# Patient Record
Sex: Female | Born: 1955 | Race: Black or African American | Hispanic: No | Smoking: Former smoker
Health system: Southern US, Community
[De-identification: ages and names within clinical notes are randomized; demographics above are authoritative.]

## PROBLEM LIST (undated history)

## (undated) DIAGNOSIS — E739 Lactose intolerance, unspecified: Secondary | ICD-10-CM

## (undated) DIAGNOSIS — K219 Gastro-esophageal reflux disease without esophagitis: Secondary | ICD-10-CM

## (undated) DIAGNOSIS — Z87442 Personal history of urinary calculi: Secondary | ICD-10-CM

## (undated) DIAGNOSIS — J449 Chronic obstructive pulmonary disease, unspecified: Secondary | ICD-10-CM

## (undated) DIAGNOSIS — R519 Headache, unspecified: Secondary | ICD-10-CM

## (undated) DIAGNOSIS — J45909 Unspecified asthma, uncomplicated: Secondary | ICD-10-CM

## (undated) DIAGNOSIS — M549 Dorsalgia, unspecified: Secondary | ICD-10-CM

## (undated) DIAGNOSIS — M1990 Unspecified osteoarthritis, unspecified site: Secondary | ICD-10-CM

## (undated) DIAGNOSIS — R600 Localized edema: Secondary | ICD-10-CM

## (undated) DIAGNOSIS — J189 Pneumonia, unspecified organism: Secondary | ICD-10-CM

## (undated) DIAGNOSIS — R0600 Dyspnea, unspecified: Secondary | ICD-10-CM

## (undated) DIAGNOSIS — R51 Headache: Secondary | ICD-10-CM

## (undated) DIAGNOSIS — Z91018 Allergy to other foods: Secondary | ICD-10-CM

## (undated) DIAGNOSIS — I10 Essential (primary) hypertension: Secondary | ICD-10-CM

## (undated) HISTORY — DX: Lactose intolerance, unspecified: E73.9

## (undated) HISTORY — DX: Essential (primary) hypertension: I10

## (undated) HISTORY — DX: Dorsalgia, unspecified: M54.9

## (undated) HISTORY — PX: COLONOSCOPY: SHX174

## (undated) HISTORY — PX: TUBAL LIGATION: SHX77

## (undated) HISTORY — DX: Localized edema: R60.0

## (undated) HISTORY — PX: CHOLECYSTECTOMY: SHX55

## (undated) HISTORY — DX: Chronic obstructive pulmonary disease, unspecified: J44.9

## (undated) HISTORY — PX: OTHER SURGICAL HISTORY: SHX169

## (undated) HISTORY — DX: Gastro-esophageal reflux disease without esophagitis: K21.9

## (undated) HISTORY — PX: WISDOM TOOTH EXTRACTION: SHX21

## (undated) HISTORY — DX: Unspecified osteoarthritis, unspecified site: M19.90

## (undated) HISTORY — DX: Dyspnea, unspecified: R06.00

## (undated) HISTORY — PX: KNEE ARTHROSCOPY: SUR90

## (undated) HISTORY — DX: Allergy to other foods: Z91.018

---

## 2002-12-09 HISTORY — PX: CARDIAC CATHETERIZATION: SHX172

## 2003-01-11 ENCOUNTER — Other Ambulatory Visit: Admission: RE | Admit: 2003-01-11 | Discharge: 2003-01-11 | Payer: Self-pay | Admitting: Obstetrics and Gynecology

## 2003-02-22 ENCOUNTER — Ambulatory Visit (HOSPITAL_COMMUNITY): Admission: RE | Admit: 2003-02-22 | Discharge: 2003-02-22 | Payer: Self-pay | Admitting: Cardiology

## 2003-02-22 ENCOUNTER — Encounter: Payer: Self-pay | Admitting: Cardiology

## 2004-11-05 ENCOUNTER — Other Ambulatory Visit: Admission: RE | Admit: 2004-11-05 | Discharge: 2004-11-05 | Payer: Self-pay | Admitting: Obstetrics and Gynecology

## 2006-05-30 ENCOUNTER — Ambulatory Visit: Payer: Self-pay | Admitting: Internal Medicine

## 2006-06-03 ENCOUNTER — Ambulatory Visit: Payer: Self-pay | Admitting: Internal Medicine

## 2006-08-29 ENCOUNTER — Ambulatory Visit: Payer: Self-pay | Admitting: Internal Medicine

## 2006-09-03 ENCOUNTER — Ambulatory Visit: Payer: Self-pay | Admitting: Internal Medicine

## 2006-11-25 ENCOUNTER — Ambulatory Visit: Payer: Self-pay | Admitting: Internal Medicine

## 2007-01-07 ENCOUNTER — Ambulatory Visit: Payer: Self-pay | Admitting: Internal Medicine

## 2007-03-31 ENCOUNTER — Ambulatory Visit (HOSPITAL_COMMUNITY): Admission: RE | Admit: 2007-03-31 | Discharge: 2007-03-31 | Payer: Self-pay | Admitting: Internal Medicine

## 2007-03-31 ENCOUNTER — Ambulatory Visit: Payer: Self-pay | Admitting: Internal Medicine

## 2007-06-05 ENCOUNTER — Ambulatory Visit: Payer: Self-pay | Admitting: Internal Medicine

## 2007-06-05 LAB — CONVERTED CEMR LAB
ALT: 14 units/L (ref 0–35)
Albumin: 3.6 g/dL (ref 3.5–5.2)
BUN: 10 mg/dL (ref 6–23)
Basophils Absolute: 0 10*3/uL (ref 0.0–0.1)
Bilirubin, Direct: 0.1 mg/dL (ref 0.0–0.3)
CO2: 30 meq/L (ref 19–32)
Calcium: 9.1 mg/dL (ref 8.4–10.5)
Chloride: 109 meq/L (ref 96–112)
Eosinophils Absolute: 0.2 10*3/uL (ref 0.0–0.6)
Eosinophils Relative: 3.5 % (ref 0.0–5.0)
GFR calc Af Amer: 113 mL/min
Glucose, Bld: 104 mg/dL — ABNORMAL HIGH (ref 70–99)
LDL Cholesterol: 140 mg/dL — ABNORMAL HIGH (ref 0–99)
Leukocytes, UA: NEGATIVE
Lymphocytes Relative: 28.9 % (ref 12.0–46.0)
MCHC: 33.8 g/dL (ref 30.0–36.0)
MCV: 91.3 fL (ref 78.0–100.0)
Monocytes Absolute: 0.6 10*3/uL (ref 0.2–0.7)
Neutro Abs: 3.4 10*3/uL (ref 1.4–7.7)
Neutrophils Relative %: 55.9 % (ref 43.0–77.0)
Potassium: 3.8 meq/L (ref 3.5–5.1)
RBC: 3.85 M/uL — ABNORMAL LOW (ref 3.87–5.11)
RDW: 12.7 % (ref 11.5–14.6)
Sodium: 142 meq/L (ref 135–145)
TSH: 1.15 microintl units/mL (ref 0.35–5.50)
Total Protein: 6.8 g/dL (ref 6.0–8.3)
Triglycerides: 54 mg/dL (ref 0–149)
Urobilinogen, UA: 1 (ref 0.0–1.0)
VLDL: 11 mg/dL (ref 0–40)
WBC: 5.9 10*3/uL (ref 4.5–10.5)
pH: 6 (ref 5.0–8.0)

## 2007-07-23 ENCOUNTER — Ambulatory Visit: Payer: Self-pay | Admitting: Endocrinology

## 2007-07-31 ENCOUNTER — Ambulatory Visit: Payer: Self-pay | Admitting: *Deleted

## 2007-08-03 ENCOUNTER — Ambulatory Visit: Payer: Self-pay | Admitting: Internal Medicine

## 2007-11-24 ENCOUNTER — Ambulatory Visit: Payer: Self-pay | Admitting: Internal Medicine

## 2007-11-24 ENCOUNTER — Encounter: Payer: Self-pay | Admitting: Internal Medicine

## 2007-11-24 DIAGNOSIS — R142 Eructation: Secondary | ICD-10-CM

## 2007-11-24 DIAGNOSIS — M1990 Unspecified osteoarthritis, unspecified site: Secondary | ICD-10-CM

## 2007-11-24 DIAGNOSIS — R143 Flatulence: Secondary | ICD-10-CM

## 2007-11-24 DIAGNOSIS — K219 Gastro-esophageal reflux disease without esophagitis: Secondary | ICD-10-CM | POA: Insufficient documentation

## 2007-11-24 DIAGNOSIS — R141 Gas pain: Secondary | ICD-10-CM | POA: Insufficient documentation

## 2007-11-24 DIAGNOSIS — J309 Allergic rhinitis, unspecified: Secondary | ICD-10-CM | POA: Insufficient documentation

## 2007-11-24 DIAGNOSIS — I10 Essential (primary) hypertension: Secondary | ICD-10-CM | POA: Insufficient documentation

## 2008-03-24 ENCOUNTER — Ambulatory Visit: Payer: Self-pay | Admitting: Internal Medicine

## 2008-03-24 DIAGNOSIS — R609 Edema, unspecified: Secondary | ICD-10-CM

## 2008-03-24 DIAGNOSIS — R42 Dizziness and giddiness: Secondary | ICD-10-CM

## 2008-03-26 IMAGING — CR DG HIP COMPLETE 2+V*R*
2 series · 2 of 2 positions shown · non-contrast
Comparison: none

CLINICAL DATA: Right hip and pelvic pain.
 RIGHT HIP ? 2 VIEW:

[t hip ap right]
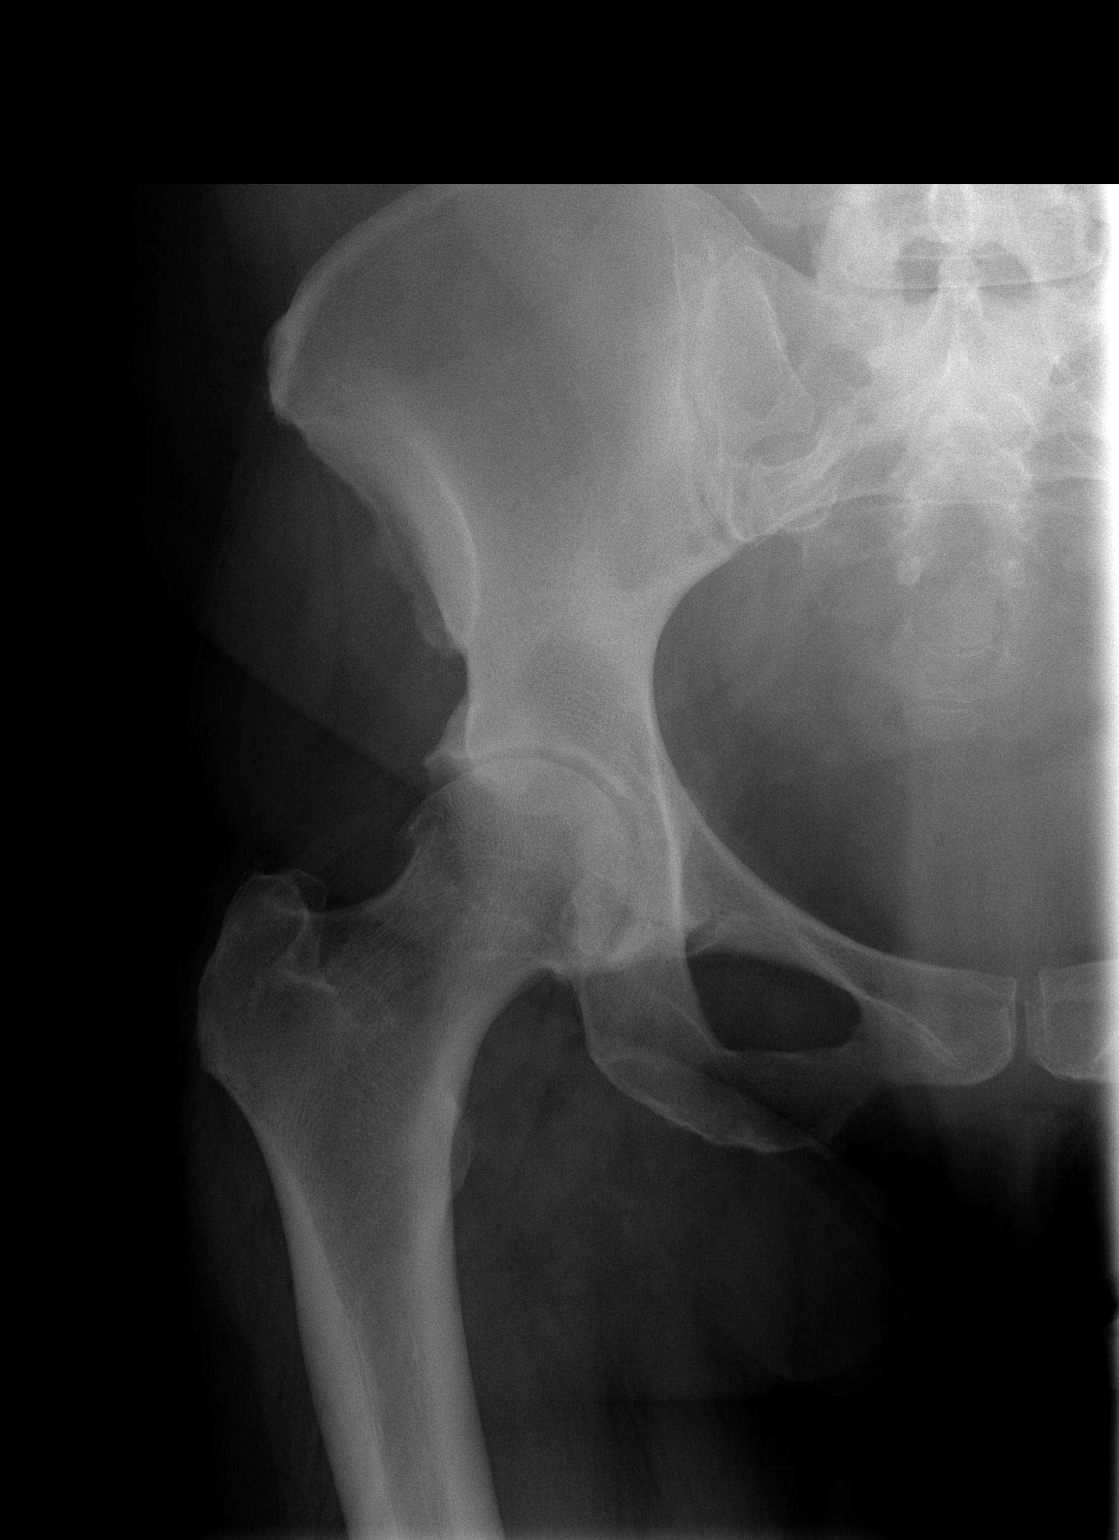

[t hip frog leg right]
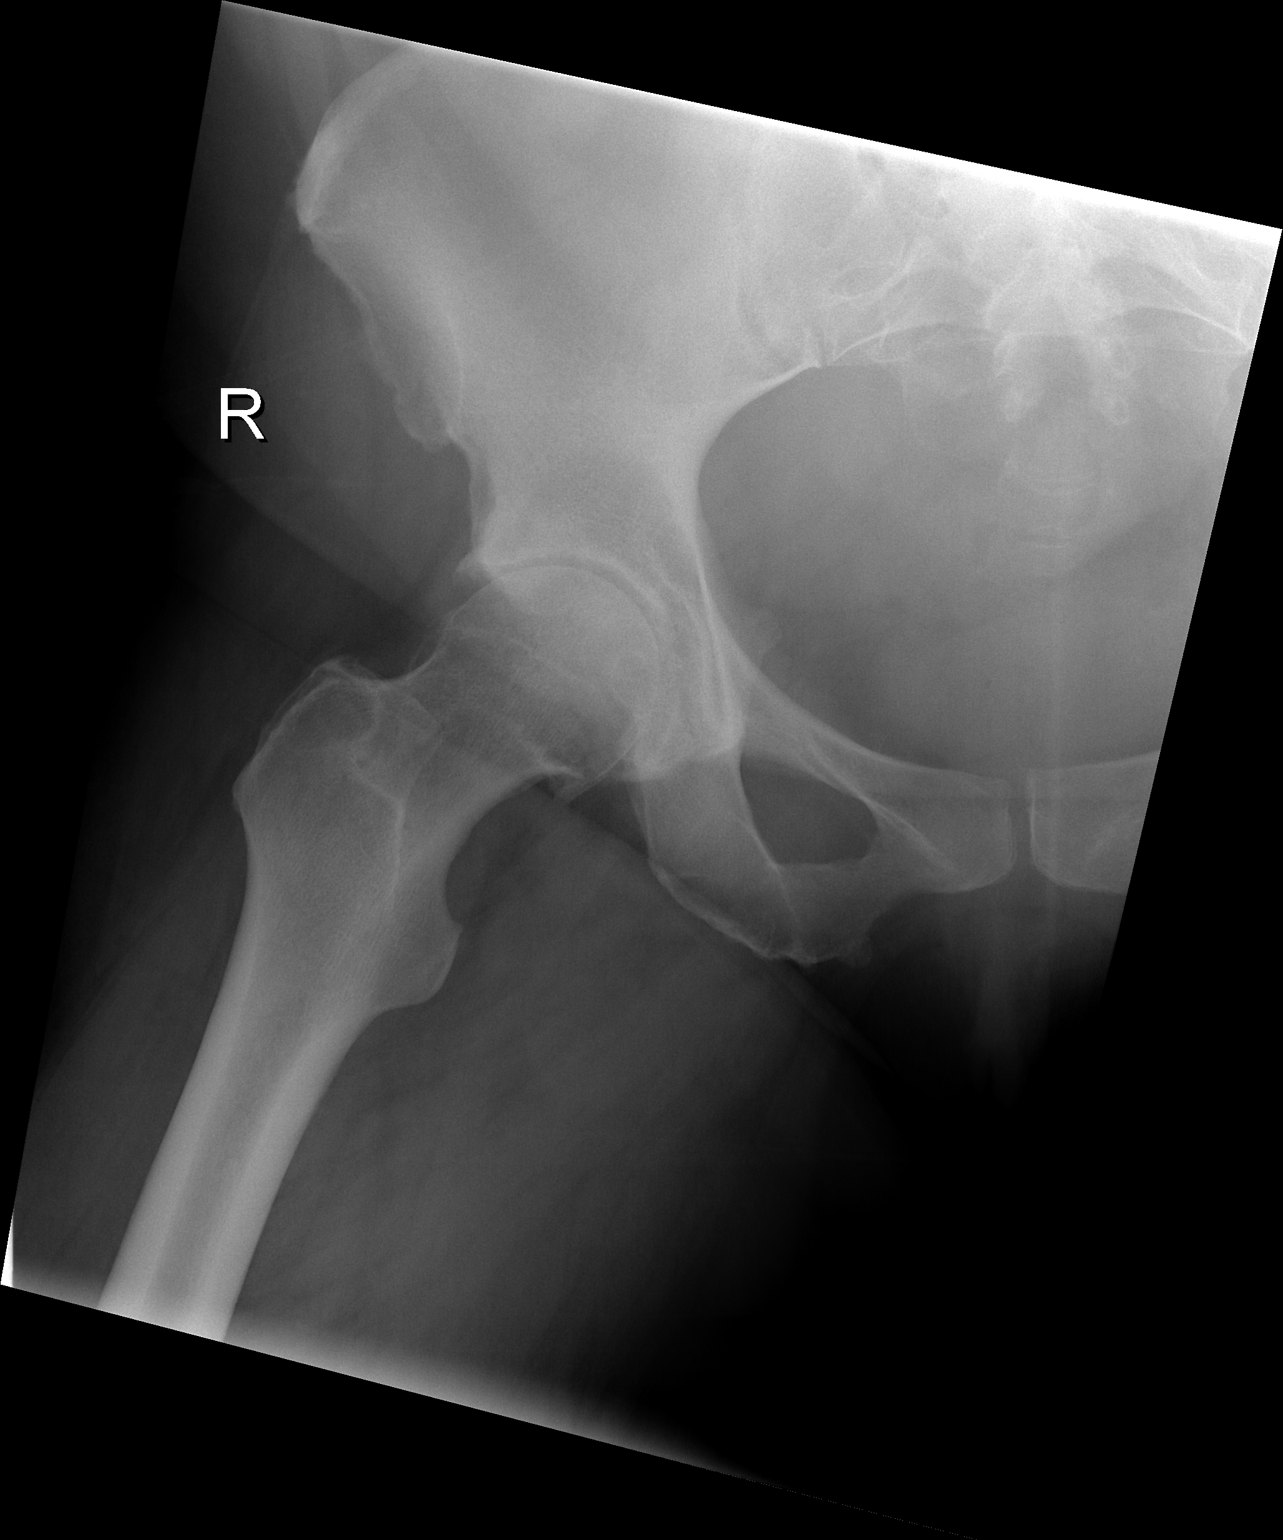

[2 of 2 positions shown; findings below may reference images not displayed]

FINDINGS: There is no evidence of fracture or dislocation.  Severe osteoarthritis of the right hip is seen.  No other bone abnormality is identified.
IMPRESSION: 1.  No acute findings.
 2.  Severe right hip joint osteoarthritis.
 PELVIS ? 1 VIEW:
FINDINGS: There is no evidence of fracture or diastasis.  No other significant bone or soft tissue abnormalities are identified.
IMPRESSION: Negative.

## 2008-06-13 ENCOUNTER — Ambulatory Visit: Payer: Self-pay | Admitting: Internal Medicine

## 2008-06-13 LAB — CONVERTED CEMR LAB
BUN: 11 mg/dL (ref 6–23)
Basophils Absolute: 0 10*3/uL (ref 0.0–0.1)
Basophils Relative: 0.6 % (ref 0.0–1.0)
Bilirubin Urine: NEGATIVE
Bilirubin, Direct: 0.1 mg/dL (ref 0.0–0.3)
GFR calc Af Amer: 96 mL/min
GFR calc non Af Amer: 80 mL/min
Glucose, Bld: 94 mg/dL (ref 70–99)
HDL: 37.1 mg/dL — ABNORMAL LOW (ref >39.0)
Hemoglobin: 12.2 g/dL (ref 12.0–15.0)
Ketones, ur: NEGATIVE mg/dL
Leukocytes, UA: NEGATIVE
Lymphocytes Relative: 27.4 % (ref 12.0–46.0)
MCHC: 34.6 g/dL (ref 30.0–36.0)
Monocytes Relative: 9 % (ref 3.0–12.0)
Neutro Abs: 3.7 10*3/uL (ref 1.4–7.7)
Neutrophils Relative %: 59.4 % (ref 43.0–77.0)
Nitrite: NEGATIVE
Potassium: 4.4 meq/L (ref 3.5–5.1)
RBC: 3.94 M/uL (ref 3.87–5.11)
RDW: 12.6 % (ref 11.5–14.6)
Total CHOL/HDL Ratio: 4.6
Total Protein, Urine: NEGATIVE mg/dL
Triglycerides: 80 mg/dL (ref 0–149)
pH: 5.5 (ref 5.0–8.0)

## 2008-06-17 ENCOUNTER — Ambulatory Visit: Payer: Self-pay | Admitting: Internal Medicine

## 2008-06-17 DIAGNOSIS — R635 Abnormal weight gain: Secondary | ICD-10-CM | POA: Insufficient documentation

## 2008-06-29 ENCOUNTER — Telehealth: Payer: Self-pay | Admitting: Internal Medicine

## 2008-10-10 ENCOUNTER — Ambulatory Visit: Payer: Self-pay | Admitting: Internal Medicine

## 2008-10-11 LAB — CONVERTED CEMR LAB
BUN: 11 mg/dL (ref 6–23)
CO2: 29 meq/L (ref 19–32)
Calcium: 9.5 mg/dL (ref 8.4–10.5)
Chloride: 107 meq/L (ref 96–112)
Creatinine, Ser: 0.9 mg/dL (ref 0.4–1.2)
GFR calc Af Amer: 84 mL/min
GFR calc non Af Amer: 70 mL/min
Glucose, Bld: 118 mg/dL — ABNORMAL HIGH (ref 70–99)

## 2008-10-21 ENCOUNTER — Ambulatory Visit: Payer: Self-pay | Admitting: Internal Medicine

## 2008-10-21 DIAGNOSIS — E669 Obesity, unspecified: Secondary | ICD-10-CM | POA: Insufficient documentation

## 2009-01-27 ENCOUNTER — Ambulatory Visit: Payer: Self-pay | Admitting: Internal Medicine

## 2009-01-27 DIAGNOSIS — R739 Hyperglycemia, unspecified: Secondary | ICD-10-CM

## 2009-01-30 LAB — CONVERTED CEMR LAB
BUN: 12 mg/dL (ref 6–23)
Calcium: 9.4 mg/dL (ref 8.4–10.5)
Creatinine, Ser: 0.8 mg/dL (ref 0.4–1.2)
GFR calc Af Amer: 96 mL/min
GFR calc non Af Amer: 80 mL/min
Glucose, Bld: 99 mg/dL (ref 70–99)
Hgb A1c MFr Bld: 6.1 % — ABNORMAL HIGH (ref 4.6–6.0)

## 2009-07-20 ENCOUNTER — Encounter: Payer: Self-pay | Admitting: Internal Medicine

## 2009-10-09 ENCOUNTER — Ambulatory Visit: Payer: Self-pay | Admitting: Internal Medicine

## 2009-10-09 DIAGNOSIS — M25569 Pain in unspecified knee: Secondary | ICD-10-CM | POA: Insufficient documentation

## 2009-10-19 ENCOUNTER — Ambulatory Visit: Payer: Self-pay | Admitting: Internal Medicine

## 2009-10-31 ENCOUNTER — Ambulatory Visit: Payer: Self-pay | Admitting: Internal Medicine

## 2009-11-07 ENCOUNTER — Encounter: Payer: Self-pay | Admitting: Internal Medicine

## 2010-01-25 ENCOUNTER — Ambulatory Visit: Payer: Self-pay | Admitting: Internal Medicine

## 2010-01-25 DIAGNOSIS — J45909 Unspecified asthma, uncomplicated: Secondary | ICD-10-CM | POA: Insufficient documentation

## 2010-05-22 ENCOUNTER — Telehealth: Payer: Self-pay | Admitting: Internal Medicine

## 2010-07-16 ENCOUNTER — Telehealth: Payer: Self-pay | Admitting: Internal Medicine

## 2010-07-24 ENCOUNTER — Telehealth (INDEPENDENT_AMBULATORY_CARE_PROVIDER_SITE_OTHER): Payer: Self-pay | Admitting: *Deleted

## 2010-07-25 ENCOUNTER — Ambulatory Visit: Payer: Self-pay | Admitting: Internal Medicine

## 2010-07-31 LAB — CONVERTED CEMR LAB
ALT: 17 units/L (ref 0–35)
Alkaline Phosphatase: 70 units/L (ref 39–117)
Basophils Absolute: 0 10*3/uL (ref 0.0–0.1)
Basophils Relative: 0.5 % (ref 0.0–3.0)
Bilirubin, Direct: 0.1 mg/dL (ref 0.0–0.3)
CO2: 30 meq/L (ref 19–32)
Calcium: 9.6 mg/dL (ref 8.4–10.5)
Creatinine, Ser: 0.7 mg/dL (ref 0.4–1.2)
Eosinophils Relative: 3.6 % (ref 0.0–5.0)
HCT: 36.3 % (ref 36.0–46.0)
Lymphs Abs: 1.5 10*3/uL (ref 0.7–4.0)
MCV: 89.7 fL (ref 78.0–100.0)
Monocytes Relative: 7.4 % (ref 3.0–12.0)
RBC: 4.05 M/uL (ref 3.87–5.11)
Sodium: 144 meq/L (ref 135–145)
Total Bilirubin: 0.6 mg/dL (ref 0.3–1.2)
Total Protein, Urine: NEGATIVE mg/dL
Total Protein: 6.8 g/dL (ref 6.0–8.3)
Urine Glucose: NEGATIVE mg/dL
Urobilinogen, UA: 0.2 (ref 0.0–1.0)
WBC: 7.2 10*3/uL (ref 4.5–10.5)

## 2010-10-26 ENCOUNTER — Ambulatory Visit: Payer: Self-pay | Admitting: Internal Medicine

## 2010-10-26 DIAGNOSIS — M25559 Pain in unspecified hip: Secondary | ICD-10-CM

## 2010-10-29 ENCOUNTER — Telehealth: Payer: Self-pay | Admitting: Internal Medicine

## 2010-10-29 LAB — CONVERTED CEMR LAB
Chloride: 103 meq/L (ref 96–112)
GFR calc non Af Amer: 68.03 mL/min (ref >60)
Glucose, Bld: 102 mg/dL — ABNORMAL HIGH (ref 70–99)
HDL: 35.3 mg/dL — ABNORMAL LOW (ref >39.00)
Hgb A1c MFr Bld: 6.3 % (ref 4.6–6.5)
Potassium: 4.5 meq/L (ref 3.5–5.1)
Sodium: 141 meq/L (ref 135–145)
Triglycerides: 104 mg/dL (ref 0.0–149.0)

## 2010-10-30 ENCOUNTER — Encounter: Payer: Self-pay | Admitting: Internal Medicine

## 2010-10-30 ENCOUNTER — Telehealth: Payer: Self-pay | Admitting: Internal Medicine

## 2010-11-26 ENCOUNTER — Telehealth: Payer: Self-pay | Admitting: Internal Medicine

## 2010-12-18 ENCOUNTER — Encounter: Payer: Self-pay | Admitting: Internal Medicine

## 2011-01-08 NOTE — Progress Notes (Signed)
----   Converted from flag ---- ---- 07/18/2010 10:06 AM, Verdell Face wrote: left message on machine to cb to sched appt/cd    ---- 07/17/2010 8:42 AM, Lanier Prude, CMA(AAMA) wrote: Please sched pt for OV per AVP.   Thanks!! ------------------------------ Gave pt  appt;  07/25/10 @ 745A w/Dr Plotnikov--phone

## 2011-01-08 NOTE — Assessment & Plan Note (Signed)
Summary: 3 MO ROV /NWS  #   Vital Signs:  Patient profile:   55 year old female Weight:      250 pounds Temp:     98.8 degrees F oral Pulse rate:   92 / minute Pulse rhythm:   regular Resp:     16 per minute BP sitting:   116 / 72  (left arm) Cuff size:   large  Vitals Entered By: Lanier Prude, CMA(AAMA) (October 26, 2010 8:00 AM) CC: 3 mo f/u Is Patient Diabetic? No Comments pt is not taking Symbicort   Primary Care Provider:  Tresa Garter MD  CC:  3 mo f/u.  History of Present Illness: The patient presents for a follow up of hypertension, elev glu, GERD  Current Medications (verified): 1)  Diprolene Af 0.05 %  Crea (Aug Betamethasone Dipropionate) .... Use Two Times A Day Prn 2)  Furosemide 20 Mg Tabs (Furosemide) .... Take 1 Tab By Mouth Every Day As Needed Swelling 3)  Potassium Chloride Cr 10 Meq  Tbcr (Potassium Chloride) .Marland Kitchen.. 1 By Mouth Once Daily As Needed With Furosemide 4)  Vitamin D3 1000 Unit  Tabs (Cholecalciferol) .Marland Kitchen.. 1 Qd 5)  Coreg 25 Mg  Tabs (Carvedilol) .Marland Kitchen.. 1 By Mouth Bid 6)  Aspir-Low 81 Mg Tbec (Aspirin) .Marland Kitchen.. 1 Once Daily After Meal 7)  Fish Oil   Oil (Fish Oil) .Marland Kitchen.. 1 Two Times A Day Po 8)  Symbicort 160-4.5 Mcg/act Aero (Budesonide-Formoterol Fumarate) .... 2 Puffs Two Times A Day For Asthma 9)  Phentermine Hcl 37.5 Mg Tabs (Phentermine Hcl) .Marland Kitchen.. 1 Every Am  Allergies (verified): 1)  ! Diovan (Valsartan) 2)  Diovan 3)  Hydrochlorothiazide 4)  Verapamil 5)  Phentermine Hcl (Phentermine Hcl)  Past History:  Past Medical History: Last updated: 01/27/2009 Allergic rhinitis Hypertension Osteoarthritis R hip - severe OA GERD ABN GLU 2009  Social History: Last updated: 01/25/2010 Occupation:office Single separated Former Smoker 2009 Alcohol use-no Drug use-no Regular exercise-no  Review of Systems       The patient complains of weight gain and difficulty walking.  The patient denies peripheral edema and abdominal pain.     Physical Exam  General:  overweight-appearing.  well-developed, well-nourished, well-hydrated, appropriate dress, normal appearance, cooperative to examination, and good hygiene.   Mouth:  Oral mucosa and oropharynx without lesions or exudates.  Teeth in good repair. Neck:  No deformities, masses, or tenderness noted. Lungs:  she has good air movement but there are diffuse, bilateral expiratory wheezes and rhonchi but no rales or decreased BS's Heart:  normal rate, regular rhythm, no murmur, no gallop, no rub, and no JVD.   Abdomen:  Bowel sounds positive,abdomen soft and non-tender without masses, organomegaly or hernias noted. Msk:  No deformity or scoliosis noted of thoracic or lumbar spine.  R troch bursa is tender to palpation and w/ROM Neurologic:  No cranial nerve deficits noted. Station and gait are normal. Plantar reflexes are down-going bilaterally. DTRs are symmetrical throughout. Sensory, motor and coordinative functions appear intact. Skin:  Intact without suspicious lesions or rashes Psych:  Cognition and judgment appear intact. Alert and cooperative with normal attention span and concentration. No apparent delusions, illusions, hallucinations   Impression & Recommendations:  Problem # 1:  ABNORMAL GLUCOSE NEC (ICD-790.29) Assessment Comment Only  Orders: TLB-BMP (Basic Metabolic Panel-BMET) (80048-METABOL) TLB-A1C / Hgb A1C (Glycohemoglobin) (83036-A1C) TLB-Lipid Panel (80061-LIPID)  Problem # 2:  OBESITY (ICD-278.00) Assessment: Deteriorated See "Patient Instructions".   Problem # 3:  GERD (ICD-530.81) Assessment: Deteriorated  Problem # 4:  OSTEOARTHRITIS (ICD-715.90) Assessment: Unchanged  Orders: TLB-BMP (Basic Metabolic Panel-BMET) (80048-METABOL) TLB-A1C / Hgb A1C (Glycohemoglobin) (83036-A1C) TLB-Lipid Panel (80061-LIPID)  Her updated medication list for this problem includes:    Aspir-low 81 Mg Tbec (Aspirin) .Marland Kitchen... 1 once daily after meal    Vimovo  500-20 Mg Tbec (Naproxen-esomeprazole) .Marland Kitchen... 1 by mouth once daily - two times a day pc as needed pain  Problem # 5:  HIP PAIN (ICD-719.45) R - troch bursitis Assessment: Deteriorated See "Patient Instructions".  Her updated medication list for this problem includes:    Aspir-low 81 Mg Tbec (Aspirin) .Marland Kitchen... 1 once daily after meal    Vimovo 500-20 Mg Tbec (Naproxen-esomeprazole) .Marland Kitchen... 1 by mouth once daily - two times a day pc as needed pain  Complete Medication List: 1)  Diprolene Af 0.05 % Crea (Aug betamethasone dipropionate) .... Use two times a day prn 2)  Furosemide 20 Mg Tabs (Furosemide) .... Take 1 tab by mouth every day as needed swelling 3)  Potassium Chloride Cr 10 Meq Tbcr (Potassium chloride) .Marland Kitchen.. 1 by mouth once daily as needed with furosemide 4)  Vitamin D3 1000 Unit Tabs (Cholecalciferol) .Marland Kitchen.. 1 qd 5)  Coreg 25 Mg Tabs (Carvedilol) .Marland Kitchen.. 1 by mouth bid 6)  Aspir-low 81 Mg Tbec (Aspirin) .Marland Kitchen.. 1 once daily after meal 7)  Fish Oil Oil (Fish oil) .Marland Kitchen.. 1 two times a day po 8)  Symbicort 160-4.5 Mcg/act Aero (Budesonide-formoterol fumarate) .... 2 puffs two times a day for asthma 9)  Phentermine Hcl 37.5 Mg Tabs (Phentermine hcl) .Marland Kitchen.. 1 every am 10)  Vimovo 500-20 Mg Tbec (Naproxen-esomeprazole) .Marland Kitchen.. 1 by mouth once daily - two times a day pc as needed pain  Patient Instructions: 1)  Please schedule a follow-up appointment in 4 well w/labs  months. 2)  Go on Youtube ([email or web address].com) and look up, "IT band stretch" and "gluteus stretch" and "trochanteric hip bursitis". See the anatomy and learn the symptoms.  .Do the stretches - it may help!  Prescriptions: PHENTERMINE HCL 37.5 MG TABS (PHENTERMINE HCL) 1 every am  #30 x 3   Entered and Authorized by:   Tresa Garter MD   Signed by:   Tresa Garter MD on 10/26/2010   Method used:   Print then Give to Patient   RxID:   [id number] VIMOVO 500-20 MG TBEC (NAPROXEN-ESOMEPRAZOLE) 1 by mouth once daily - two times a  day pc as needed pain  #60 x 3   Entered and Authorized by:   Tresa Garter MD   Signed by:   Tresa Garter MD on 10/26/2010   Method used:   Print then Give to Patient   RxID:   [id number]    Orders Added: 1)  TLB-BMP (Basic Metabolic Panel-BMET) [80048-METABOL] 2)  TLB-A1C / Hgb A1C (Glycohemoglobin) [83036-A1C] 3)  TLB-Lipid Panel [80061-LIPID] 4)  Est. Patient Level IV [25956]   Immunization History:  Influenza Immunization History:    Influenza:  historical (08/23/2010)   Immunization History:  Influenza Immunization History:    Influenza:  Historical (08/23/2010)

## 2011-01-08 NOTE — Assessment & Plan Note (Signed)
Summary: PER FLAG/STACEY--OV--PHONE--STC   Vital Signs:  Patient profile:   55 year old female Weight:      248 pounds Temp:     98.5 degrees F oral Pulse rate:   88 / minute Pulse rhythm:   regular Resp:     16 per minute BP sitting:   130 / 80  (left arm) Cuff size:   large  Vitals Entered By: Lanier Prude, Beverly Gust) (July 25, 2010 7:53 AM) CC: f/u. c/o recent hair loss Is Patient Diabetic? No Comments pt is not taking Tussinex, Symbicort or Tamiflu.  Please remove   Primary Care Atley Neubert:  Tresa Garter MD  CC:  f/u. c/o recent hair loss.  History of Present Illness: The patient presents for a follow up of hypertension, obesity, asthma, OA   Current Medications (verified): 1)  Diprolene Af 0.05 %  Crea (Aug Betamethasone Dipropionate) .... Use Two Times A Day Prn 2)  Furosemide 20 Mg Tabs (Furosemide) .... Take 1 Tab By Mouth Every Day As Needed Swelling 3)  Potassium Chloride Cr 10 Meq  Tbcr (Potassium Chloride) .Marland Kitchen.. 1 By Mouth Once Daily As Needed With Furosemide 4)  Vitamin D3 1000 Unit  Tabs (Cholecalciferol) .Marland Kitchen.. 1 Qd 5)  Coreg 25 Mg  Tabs (Carvedilol) .Marland Kitchen.. 1 By Mouth Bid 6)  Aspir-Low 81 Mg Tbec (Aspirin) .Marland Kitchen.. 1 Once Daily After Meal 7)  Tamiflu 75 Mg Caps (Oseltamivir Phosphate) .... Take One Capsule By Mouth Twice A Day 8)  Fish Oil   Oil (Fish Oil) .Marland Kitchen.. 1 Two Times A Day Po 9)  Symbicort 160-4.5 Mcg/act Aero (Budesonide-Formoterol Fumarate) .... 2 Puffs Two Times A Day For Asthma 10)  Tussionex Pennkinetic Er 8-10 Mg/47ml Lqcr (Chlorpheniramine-Hydrocodone) .... 5 Ml By Mouth Two Times A Day As Needed For Cough 11)  Phentermine Hcl 37.5 Mg Tabs (Phentermine Hcl) .Marland Kitchen.. 1 Every Am  Allergies (verified): 1)  ! Diovan (Valsartan) 2)  Diovan 3)  Hydrochlorothiazide 4)  Verapamil 5)  Phentermine Hcl (Phentermine Hcl)  Past History:  Past Medical History: Last updated: 01/27/2009 Allergic rhinitis Hypertension Osteoarthritis R hip - severe  OA GERD ABN GLU 2009  Past Surgical History: Last updated: 01/25/2010 Denies surgical history  Social History: Last updated: 01/25/2010 Occupation:office Single separated Former Smoker 2009 Alcohol use-no Drug use-no Regular exercise-no  Family History: Family History Hypertension M Parkinson's  Review of Systems       The patient complains of weight gain.  The patient denies fever, weight loss, chest pain, abdominal pain, and difficulty walking.    Physical Exam  General:  overweight-appearing.  well-developed, well-nourished, well-hydrated, appropriate dress, normal appearance, cooperative to examination, and good hygiene.   Eyes:  No corneal or conjunctival inflammation noted. EOMI. Perrla. Funduscopic exam benign, without hemorrhages, exudates or papilledema. Vision grossly normal. Ears:  External ear exam shows no significant lesions or deformities.  Otoscopic examination reveals clear canals, tympanic membranes are intact bilaterally without bulging, retraction, inflammation or discharge. Hearing is grossly normal bilaterally. Nose:  External nasal examination shows no deformity or inflammation. Nasal mucosa are pink and moist without lesions or exudates. Mouth:  Oral mucosa and oropharynx without lesions or exudates.  Teeth in good repair. Neck:  No deformities, masses, or tenderness noted. Lungs:  she has good air movement but there are diffuse, bilateral expiratory wheezes and rhonchi but no rales or decreased BS's Heart:  normal rate, regular rhythm, no murmur, no gallop, no rub, and no JVD.   Abdomen:  Bowel sounds  positive,abdomen soft and non-tender without masses, organomegaly or hernias noted. Msk:  No deformity or scoliosis noted of thoracic or lumbar spine.   Extremities:  No clubbing, cyanosis, edema, or deformity noted with normal full range of motion of all joints.   Neurologic:  No cranial nerve deficits noted. Station and gait are normal. Plantar reflexes  are down-going bilaterally. DTRs are symmetrical throughout. Sensory, motor and coordinative functions appear intact. Skin:  Intact without suspicious lesions or rashes Psych:  Cognition and judgment appear intact. Alert and cooperative with normal attention span and concentration. No apparent delusions, illusions, hallucinations   Impression & Recommendations:  Problem # 1:  HYPERTENSION (ICD-401.9) Assessment Unchanged  Her updated medication list for this problem includes:    Furosemide 20 Mg Tabs (Furosemide) .Marland Kitchen... Take 1 tab by mouth every day as needed swelling    Coreg 25 Mg Tabs (Carvedilol) .Marland Kitchen... 1 by mouth bid  Orders: TLB-BMP (Basic Metabolic Panel-BMET) (80048-METABOL) TLB-CBC Platelet - w/Differential (85025-CBCD) TLB-Hepatic/Liver Function Pnl (80076-HEPATIC) TLB-TSH (Thyroid Stimulating Hormone) (84443-TSH) TLB-Udip ONLY (81003-UDIP) TLB-A1C / Hgb A1C (Glycohemoglobin) (83036-A1C)  Problem # 2:  OBESITY (ICD-278.00) Assessment: Deteriorated Phentermine to cont Risks vs benefits and controversies of a long term phentermine use were discussed.  Orders: TLB-BMP (Basic Metabolic Panel-BMET) (80048-METABOL) TLB-CBC Platelet - w/Differential (85025-CBCD) TLB-Hepatic/Liver Function Pnl (80076-HEPATIC) TLB-TSH (Thyroid Stimulating Hormone) (84443-TSH) TLB-Udip ONLY (81003-UDIP) TLB-A1C / Hgb A1C (Glycohemoglobin) (83036-A1C)  Problem # 3:  ABNORMAL GLUCOSE NEC (ICD-790.29) Assessment: Comment Only  Orders: TLB-BMP (Basic Metabolic Panel-BMET) (80048-METABOL) TLB-CBC Platelet - w/Differential (85025-CBCD) TLB-Hepatic/Liver Function Pnl (80076-HEPATIC) TLB-TSH (Thyroid Stimulating Hormone) (84443-TSH) TLB-Udip ONLY (81003-UDIP) TLB-A1C / Hgb A1C (Glycohemoglobin) (83036-A1C)  Problem # 4:  GERD (ICD-530.81) Assessment: Comment Only  Orders: TLB-BMP (Basic Metabolic Panel-BMET) (80048-METABOL) TLB-CBC Platelet - w/Differential (85025-CBCD) TLB-Hepatic/Liver  Function Pnl (80076-HEPATIC) TLB-TSH (Thyroid Stimulating Hormone) (84443-TSH) TLB-Udip ONLY (81003-UDIP) TLB-A1C / Hgb A1C (Glycohemoglobin) (83036-A1C)  Complete Medication List: 1)  Diprolene Af 0.05 % Crea (Aug betamethasone dipropionate) .... Use two times a day prn 2)  Furosemide 20 Mg Tabs (Furosemide) .... Take 1 tab by mouth every day as needed swelling 3)  Potassium Chloride Cr 10 Meq Tbcr (Potassium chloride) .Marland Kitchen.. 1 by mouth once daily as needed with furosemide 4)  Vitamin D3 1000 Unit Tabs (Cholecalciferol) .Marland Kitchen.. 1 qd 5)  Coreg 25 Mg Tabs (Carvedilol) .Marland Kitchen.. 1 by mouth bid 6)  Aspir-low 81 Mg Tbec (Aspirin) .Marland Kitchen.. 1 once daily after meal 7)  Tamiflu 75 Mg Caps (Oseltamivir phosphate) .... Take one capsule by mouth twice a day 8)  Fish Oil Oil (Fish oil) .Marland Kitchen.. 1 two times a day po 9)  Symbicort 160-4.5 Mcg/act Aero (Budesonide-formoterol fumarate) .... 2 puffs two times a day for asthma 10)  Phentermine Hcl 37.5 Mg Tabs (Phentermine hcl) .Marland Kitchen.. 1 every am  Patient Instructions: 1)  Please schedule a follow-up appointment in 3 months. Prescriptions: PHENTERMINE HCL 37.5 MG TABS (PHENTERMINE HCL) 1 every am  #30 x 3   Entered and Authorized by:   Tresa Garter MD   Signed by:   Tresa Garter MD on 07/25/2010   Method used:   Print then Give to Patient   RxID:   [id number]

## 2011-01-08 NOTE — Progress Notes (Signed)
Summary: PA Vimovo  Phone Note From Pharmacy   Summary of Call: PA Vimovo called [employer] [phone] ref #[id number], form sent to Dr Posey Rea to be completed  Dagoberto Reef  October 30, 2010 3:25 PM  Insurance denied does not meet medical necessity found in the members benefit booklet. Initial call taken by: Dagoberto Reef,  November 06, 2010 9:03 AM  Follow-up for Phone Call        noted Thank you!  Follow-up by: Tresa Garter MD,  November 07, 2010 1:04 PM

## 2011-01-08 NOTE — Progress Notes (Signed)
Summary: Refill--Phentermine  Phone Note From Pharmacy   Caller: [facility]  [address]  [phone]* Summary of Call: Requests refill of Phentermine. Please advise. Initial call taken by: Lucious Groves,  May 22, 2010 9:17 AM  Follow-up for Phone Call        ok x 1 Follow-up by: Tresa Garter MD,  May 22, 2010 5:13 PM    New/Updated Medications: PHENTERMINE HCL 37.5 MG TABS (PHENTERMINE HCL) 1 every am Prescriptions: PHENTERMINE HCL 37.5 MG TABS (PHENTERMINE HCL) 1 every am  #30 x 0   Entered by:   Lamar Sprinkles, CMA   Authorized by:   Tresa Garter MD   Signed by:   Lamar Sprinkles, CMA on 05/22/2010   Method used:   Telephoned to ...       [facility]  [phone]* (retail)       [address] [place], [place]  [address]       Ph: [phone] or [phone]       Fax: [phone]   RxID:   [id number]

## 2011-01-08 NOTE — Progress Notes (Signed)
Summary: Rf Phentermine  Phone Note Refill Request Message from:  Fax from Pharmacy  Refills Requested: Medication #1:  PHENTERMINE HCL 37.5 MG TABS 1 every am.   Dosage confirmed as above?Dosage Confirmed   Supply Requested: 30   Last Refilled: 05/22/2010  Method Requested: Telephone to Pharmacy Initial call taken by: Lanier Prude, [facility]),  July 16, 2010 9:44 AM  Follow-up for Phone Call        ok x 1 and OV Follow-up by: Tresa Garter MD,  July 17, 2010 8:02 AM  Additional Follow-up for Phone Call Additional follow up Details #1::        Rx called to pharmacy Additional Follow-up by: Lanier Prude, [facility]),  July 17, 2010 8:41 AM    Prescriptions: PHENTERMINE HCL 37.5 MG TABS (PHENTERMINE HCL) 1 every am  #30 x 0   Entered by:   Lanier Prude, CMA(AAMA)   Authorized by:   Tresa Garter MD   Signed by:   Lanier Prude, CMA(AAMA) on 07/17/2010   Method used:   Telephoned to ...       [facility]  [phone]* (retail)       [address] [place], [place]  [address]       Ph: [phone] or [phone]       Fax: [phone]   RxID:   [id number]

## 2011-01-08 NOTE — Assessment & Plan Note (Signed)
Summary: flu symptoms/no fever/plot/#/cd   Vital Signs:  Patient profile:   55 year old female Weight:      247 pounds O2 Sat:      96 % on Room air Temp:     98.2 degrees F oral Pulse rate:   84 / minute Pulse rhythm:   regular Resp:     20 per minute BP sitting:   130 / 66  (left arm) Cuff size:   large  Vitals Entered By: Rock Nephew CMA (January 25, 2010 10:47 AM)  O2 Flow:  Room air CC: follow up after flu like symptoms- bodyache, URI , URI symptoms   Primary Care Provider:  Tresa Garter MD  CC:  follow up after flu like symptoms- bodyache, URI , and URI symptoms.  History of Present Illness:  URI Symptoms      This is a 55 year old woman who presents with URI symptoms.  The symptoms began 1 week ago.  The severity is described as moderate.  The patient reports nasal congestion, clear nasal discharge, sore throat, productive cough, and sick contacts, but denies purulent nasal discharge and earache.  Associated symptoms include low-grade fever (<100.5 degrees), dyspnea, and wheezing.  The patient denies stiff neck, rash, vomiting, diarrhea, and use of an antipyretic.  The patient also reports muscle aches and severe fatigue.  The patient denies seasonal symptoms and headache.  The patient denies the following risk factors for Strep sinusitis: unilateral facial pain, unilateral nasal discharge, double sickening, Strep exposure, tender adenopathy, and absence of cough.    Preventive Screening-Counseling & Management  Alcohol-Tobacco     Alcohol drinks/day: 0     Smoking Status: quit > 6 months     Year Quit: 2009  Caffeine-Diet-Exercise     Does Patient Exercise: no      Drug Use:  no.    Allergies: 1)  ! Diovan (Valsartan) 2)  Diovan 3)  Hydrochlorothiazide 4)  Verapamil 5)  Phentermine Hcl (Phentermine Hcl)  Past History:  Past Medical History: Reviewed history from 01/27/2009 and no changes required. Allergic rhinitis Hypertension Osteoarthritis  R hip - severe OA GERD ABN GLU 2009  Past Surgical History: Denies surgical history  Family History: Reviewed history from 11/24/2007 and no changes required. Family History Hypertension  Social History: Reviewed history from 03/24/2008 and no changes required. Occupation:office Single separated Former Smoker 2009 Alcohol use-no Drug use-no Regular exercise-no Smoking Status:  quit > 6 months Drug Use:  no Does Patient Exercise:  no  Review of Systems       The patient complains of weight gain.  The patient denies anorexia, chest pain, peripheral edema, hemoptysis, abdominal pain, hematuria, suspicious skin lesions, and enlarged lymph nodes.    Physical Exam  General:  overweight-appearing.  well-developed, well-nourished, well-hydrated, appropriate dress, normal appearance, cooperative to examination, and good hygiene.   Head:  normocephalic, atraumatic, no abnormalities observed, and no abnormalities palpated.   Ears:  External ear exam shows no significant lesions or deformities.  Otoscopic examination reveals clear canals, tympanic membranes are intact bilaterally without bulging, retraction, inflammation or discharge. Hearing is grossly normal bilaterally. Nose:  External nasal examination shows no deformity or inflammation. Nasal mucosa are pink and moist without lesions or exudates. Mouth:  Oral mucosa and oropharynx without lesions or exudates.  Teeth in good repair. Neck:  No deformities, masses, or tenderness noted. Lungs:  she has good air movement but there are diffuse, bilateral expiratory wheezes and rhonchi but  no rales or decreased BS's Heart:  normal rate, regular rhythm, no murmur, no gallop, no rub, and no JVD.   Abdomen:  Bowel sounds positive,abdomen soft and non-tender without masses, organomegaly or hernias noted. Msk:  No deformity or scoliosis noted of thoracic or lumbar spine.   Pulses:  R and L carotid,radial,femoral,dorsalis pedis and posterior  tibial pulses are full and equal bilaterally Extremities:  No clubbing, cyanosis, edema, or deformity noted with normal full range of motion of all joints.   Neurologic:  No cranial nerve deficits noted. Station and gait are normal. Plantar reflexes are down-going bilaterally. DTRs are symmetrical throughout. Sensory, motor and coordinative functions appear intact. Skin:  Intact without suspicious lesions or rashes Cervical Nodes:  no anterior cervical adenopathy and no posterior cervical adenopathy.   Axillary Nodes:  no R axillary adenopathy and no L axillary adenopathy.   Psych:  Cognition and judgment appear intact. Alert and cooperative with normal attention span and concentration. No apparent delusions, illusions, hallucinations   Impression & Recommendations:  Problem # 1:  COUGH (ICD-786.2)  Orders: T-2 View CXR (71020TC) Admin of Therapeutic Inj  intramuscular or subcutaneous (81191) Depo- Medrol 40mg  (J1030) Depo- Medrol 80mg  (J1040)  Problem # 2:  BRONCHITIS-ACUTE (ICD-466.0)  Her updated medication list for this problem includes:    Symbicort 160-4.5 Mcg/act Aero (Budesonide-formoterol fumarate) .Marland Kitchen... 2 puffs two times a day for asthma    Zithromax Tri-pak 500 Mg Tab (Azithromycin) .Marland Kitchen... Take one by mouth once daily for 3 days    Tussionex Pennkinetic Er 8-10 Mg/73ml Lqcr (Chlorpheniramine-hydrocodone) .Marland KitchenMarland KitchenMarland KitchenMarland Kitchen 5 ml by mouth two times a day as needed for cough  Problem # 3:  CHRONIC OBSTRUCTIVE ASTHMA WITH EXACERBATION (YNW-295.62)  Her updated medication list for this problem includes:    Symbicort 160-4.5 Mcg/act Aero (Budesonide-formoterol fumarate) .Marland Kitchen... 2 puffs two times a day for asthma  Complete Medication List: 1)  Diprolene Af 0.05 % Crea (Aug betamethasone dipropionate) .... Use two times a day prn 2)  Furosemide 20 Mg Tabs (Furosemide) .... Take 1 tab by mouth every day as needed swelling 3)  Potassium Chloride Cr 10 Meq Tbcr (Potassium chloride) .Marland Kitchen.. 1 by mouth  once daily as needed with furosemide 4)  Vitamin D3 1000 Unit Tabs (Cholecalciferol) .Marland Kitchen.. 1 qd 5)  Coreg 25 Mg Tabs (Carvedilol) .Marland Kitchen.. 1 by mouth bid 6)  Aspir-low 81 Mg Tbec (Aspirin) .Marland Kitchen.. 1 once daily after meal 7)  Tamiflu 75 Mg Caps (Oseltamivir phosphate) .... Take one capsule by mouth twice a day 8)  Fish Oil Oil (Fish oil) .Marland Kitchen.. 1 two times a day po 9)  Symbicort 160-4.5 Mcg/act Aero (Budesonide-formoterol fumarate) .... 2 puffs two times a day for asthma 10)  Zithromax Tri-pak 500 Mg Tab (Azithromycin) .... Take one by mouth once daily for 3 days 11)  Tussionex Pennkinetic Er 8-10 Mg/79ml Lqcr (Chlorpheniramine-hydrocodone) .... 5 ml by mouth two times a day as needed for cough  Patient Instructions: 1)  Please schedule a follow-up appointment in 2 weeks. 2)  It is important that you exercise regularly at least 20 minutes 5 times a week. If you develop chest pain, have severe difficulty breathing, or feel very tired , stop exercising immediately and seek medical attention. 3)  You need to lose weight. Consider a lower calorie diet and regular exercise.  4)  Take your antibiotic as prescribed until ALL of it is gone, but stop if you develop a rash or swelling and contact our office as soon  as possible. 5)  Acute bronchitis symptoms for less than 10 days are not helped by antibiotics. take over the counter cough medications. call if no improvment in  5-7 days, sooner if increasing cough, fever, or new symptoms( shortness of breath, chest pain). Prescriptions: TUSSIONEX PENNKINETIC ER 8-10 MG/5ML LQCR (CHLORPHENIRAMINE-HYDROCODONE) 5 ml by mouth two times a day as needed for cough  #4 ounces x 0   Entered and Authorized by:   Etta Grandchild MD   Signed by:   Etta Grandchild MD on 01/25/2010   Method used:   Print then Give to Patient   RxID:   [id number] QMVHQIONG TRI-PAK 500 MG TAB (AZITHROMYCIN) Take one by mouth once daily for 3 days  #3 x 0   Entered and Authorized by:   Etta Grandchild MD   Signed by:   Etta Grandchild MD on 01/25/2010   Method used:   Print then Give to Patient   RxID:   [id number] SYMBICORT 160-4.5 MCG/ACT AERO (BUDESONIDE-FORMOTEROL FUMARATE) 2 puffs two times a day for asthma  #5 inhs x 0   Entered and Authorized by:   Etta Grandchild MD   Signed by:   Etta Grandchild MD on 01/25/2010   Method used:   Samples Given   RxID:   [id number]      Medication Administration  Injection # 1:    Medication: Depo- Medrol 80mg     Diagnosis: COUGH (ICD-786.2)    Route: IM    Site: R deltoid    Exp Date: 08/2012    Lot #: Franchot Heidelberg    Mfr: pfizer    Patient tolerated injection without complications    Given by: Rock Nephew CMA (January 25, 2010 11:17 AM)  Injection # 2:    Medication: Depo- Medrol 40mg     Diagnosis: COUGH (ICD-786.2)    Route: IM    Site: R deltoid    Exp Date: 08/2012    Lot #: Franchot Heidelberg    Mfr: pfizer    Patient tolerated injection without complications    Given by: Rock Nephew CMA (January 25, 2010 11:17 AM)  Orders Added: 1)  T-2 View CXR [71020TC] 2)  Admin of Therapeutic Inj  intramuscular or subcutaneous [96372] 3)  Depo- Medrol 40mg  [J1030] 4)  Depo- Medrol 80mg  [J1040] 5)  Est. Patient Level IV [63875]

## 2011-01-08 NOTE — Progress Notes (Signed)
Summary: Cholesterol numbers  Phone Note Call from Patient   Caller: Patient Summary of Call: I informed pt of cholesterol numbers. She wants to know what else she can do to improve LDL/HDL since they are both slightly out of range. Please advise Initial call taken by: Lanier Prude, [facility]),  October 29, 2010 12:03 PM  Follow-up for Phone Call        Loose wt, eat healthy, exercise Follow-up by: Tresa Garter MD,  October 29, 2010 5:32 PM  Additional Follow-up for Phone Call Additional follow up Details #1::        # busy x 2 attempts...............Marland KitchenLamar Sprinkles, CMA  October 29, 2010 6:26 PM     Additional Follow-up for Phone Call Additional follow up Details #2::    pt informed  Follow-up by: Lanier Prude, [facility]),  October 30, 2010 9:55 AM

## 2011-01-10 NOTE — Progress Notes (Signed)
Summary: Vimovo ?  Phone Note From Pharmacy   Caller: [facility] She[address] Maroon  [phone]* Summary of Call: pt's Vimovo was denied. Do you want to change it to something different? Initial call taken by: Lanier Prude, [facility]),  November 26, 2010 8:41 AM  Follow-up for Phone Call        OK omepr and naprox Follow-up by: Tresa Garter MD,  November 26, 2010 5:41 PM  Additional Follow-up for Phone Call Additional follow up Details #1::        left mess to call office back................Marland KitchenLamar Sprinkles, CMA  November 26, 2010 6:12 PM   left mess to call office back.................Marland KitchenLamar Sprinkles, CMA  November 27, 2010 3:54 PM   pt left vm at 5pm w/cell # [phone], attempted to call, no answer, left vm...............Marland KitchenLamar Sprinkles, CMA  November 27, 2010 6:02 PM     Additional Follow-up for Phone Call Additional follow up Details #2::    pt informed Follow-up by: Brenton Grills CMA [facility]),  November 28, 2010 8:57 AM  New/Updated Medications: NAPROXEN 500 MG TABS (NAPROXEN) 1 by mouth two times a day pc for pain/arthritis OMEPRAZOLE 40 MG CPDR (OMEPRAZOLE) 1 by mouth qam with Naproxen Prescriptions: OMEPRAZOLE 40 MG CPDR (OMEPRAZOLE) 1 by mouth qam with Naproxen  #30 x 3   Entered and Authorized by:   Tresa Garter MD   Signed by:   Lamar Sprinkles, CMA on 11/26/2010   Method used:   Electronically to        [facility]  [phone]* (retail)       [address] [place], [place]  [address]       Ph: [phone] or [phone]       Fax: [phone]   RxID:   [id number] NAPROXEN 500 MG TABS (NAPROXEN) 1 by mouth two times a day pc for pain/arthritis  #60 x 3   Entered and Authorized by:   Tresa Garter MD   Signed by:   Lamar Sprinkles, CMA on 11/26/2010   Method used:   Electronically to        [facility]  [phone]* (retail)       [address] [place], [place]  [address]       Ph: [phone] or [phone]       Fax: [phone]  RxID:   [id number]

## 2011-01-10 NOTE — Medication Information (Signed)
Summary: Prior autho & Denied for Vimovo/[employer]  Prior autho & Denied for Vimovo/[employer]   Imported By: Sherian Rein 11/23/2010 12:56:10  _____________________________________________________________________  External Attachment:    Type:   Image     Comment:   External Document

## 2011-01-10 NOTE — Letter (Signed)
Summary: Appt Reminder 2  [facility]  [address] [place], [place] [address]   Phone: [phone]  Fax: [phone]        December 18, 2010 MRN: [id number]    [name] [address] [place], [place]  [address]    Dear [name],   You have a return appointment with Dr. Leone Payor on 01/14/11 at 8:30am.  Please remember to bring a complete list of the medicines you are taking, your insurance card and your co-pay.  If you have to cancel or reschedule this appointment, please call before 5:00 pm the evening before to avoid a cancellation fee.  If you have any questions or concerns, please call [phone].    Sincerely,    Selinda Michaels RN  Appended Document: Appt Reminder 2 Letter is mailed to the patient's home address

## 2011-01-14 ENCOUNTER — Ambulatory Visit: Payer: Self-pay | Admitting: Internal Medicine

## 2011-02-22 ENCOUNTER — Other Ambulatory Visit: Payer: Self-pay

## 2011-03-01 ENCOUNTER — Ambulatory Visit: Payer: BC Managed Care – PPO | Admitting: Internal Medicine

## 2011-03-01 DIAGNOSIS — Z0289 Encounter for other administrative examinations: Secondary | ICD-10-CM

## 2011-04-23 NOTE — Assessment & Plan Note (Signed)
[facility]                           PRIMARY CARE OFFICE NOTE   NAME:Shelby, [name]                       MRN:          [id number]  DATE:06/05/2007                            DOB:          February 22, 1956    The patient is a 55 year old female who presents for a wellness  examination.   PAST MEDICAL HISTORY:  As per June 03, 2006 note.   FAMILY HISTORY:  As per June 03, 2006 note.   SOCIAL HISTORY:  As per June 03, 2006 note.  She has been separated for  several months now.  Does not smoke.   REVIEW OF SYSTEMS:  No chest pain or shortness of breath.  Severe pain  and stiffness in the right hip over the past several months.  Flare up  was noted lately.  The rest of the 18-point review of system is  negative.   PHYSICAL EXAMINATION:  Blood pressure 151/77.  Pulse 94.  Temperature  98.7.  Weight 229 (was 226).  She is in no acute distress.  Looks well.  HEENT:  Shows moist mucosa.  NECK:  Supple.  No thyromegaly or bruit.  LUNGS:  Clear to auscultation and percussion.  No wheezes or rales.  HEART:  S1 and S2.  No murmur.  No gallop.  ABDOMEN:  Soft and non-tender.  No organomegaly or mass felt.  LOWER EXTREMITIES:  Without edema.  The right hip with decreased range  of motion.  No visible deformity.  No limp was present.  She is alert, oriented, and appropriate.  Eczema changes on the fingertips involving several fingers.   Labs on June 05, 2007:  Hemoglobin 11.9.  CMET normal.  Glucose 104.  TSH normal.  Urinalysis normal.  EKG today normal.  LDL 140, cholesterol  184.   ASSESSMENT AND PLAN:  1. Normal wellness examination.  Age/health issues discussed.  Healthy      lifestyle discussed.  She is due for a Gynecology appointment that      she is going to schedule.  Mammogram yearly.  She is past due      colonoscopy, advised to schedule.  Given tetanus shot.  Other shots      are up to date.  2. Right hip severe osteoarthritis confirmed by the  x-ray.  She can      use Darvocet N100 q.i.d. p.r.n. Risks and benefits discussed.      Naproxen 500 p.o. b.i.d. after meals as needed.  3. Eczema.  Diprolene cream to use b.i.d. p.r.n.  4. Hypertension.  Increase verapamil dose to 240 mg daily.  Call me if      the blood pressure is elevated.  I will see her back in 6 months.     Georgina Quint. Plotnikov, MD  Electronically Signed    AVP/MedQ  DD: 06/10/2007  DT: 06/10/2007  Job #: [id number]

## 2011-05-04 ENCOUNTER — Other Ambulatory Visit: Payer: Self-pay | Admitting: Internal Medicine

## 2011-07-06 ENCOUNTER — Other Ambulatory Visit: Payer: Self-pay | Admitting: Internal Medicine

## 2011-10-15 ENCOUNTER — Other Ambulatory Visit: Payer: Self-pay | Admitting: Internal Medicine

## 2011-12-18 ENCOUNTER — Telehealth: Payer: Self-pay | Admitting: *Deleted

## 2011-12-18 DIAGNOSIS — Z0000 Encounter for general adult medical examination without abnormal findings: Secondary | ICD-10-CM

## 2011-12-18 NOTE — Telephone Encounter (Signed)
Labs for wellness pls Thx

## 2011-12-18 NOTE — Telephone Encounter (Signed)
Pt left vm requesting labs to be checked (LDL, HDL). She states she is trying to schedule a OV but your next available is 4/13. She wants her labs done before then. Please advise what labs and when?

## 2011-12-19 NOTE — Telephone Encounter (Signed)
Labs entered. Pt informed. She state she just had a Biometric screening today at her work so she doesn't need her lipids checked.

## 2012-01-08 ENCOUNTER — Telehealth: Payer: Self-pay | Admitting: *Deleted

## 2012-01-08 MED ORDER — NAPROXEN-ESOMEPRAZOLE 500-20 MG PO TBEC: 1.0000 | DELAYED_RELEASE_TABLET | Freq: Two times a day (BID) | ORAL | Status: DC | PRN

## 2012-01-08 NOTE — Telephone Encounter (Signed)
Rf req for Vimovo 500-20 mg 1 po bid after meals prn pain # 60. Last filled 11.21.11. Med is not [place] med list. Ok to Rf?

## 2012-01-08 NOTE — Telephone Encounter (Signed)
OK to fill this prescription with additional refills x3 Thank you!  

## 2012-01-20 ENCOUNTER — Other Ambulatory Visit: Payer: Self-pay | Admitting: *Deleted

## 2012-01-20 MED ORDER — CARVEDILOL 25 MG PO TABS: 25.0000 mg | ORAL_TABLET | Freq: Two times a day (BID) | ORAL | Status: DC

## 2012-01-21 ENCOUNTER — Encounter: Payer: Self-pay | Admitting: *Deleted

## 2012-01-31 ENCOUNTER — Other Ambulatory Visit: Payer: Self-pay | Admitting: Internal Medicine

## 2012-02-05 ENCOUNTER — Telehealth: Payer: Self-pay | Admitting: *Deleted

## 2012-02-05 MED ORDER — NAPROXEN 500 MG PO TABS: 500.0000 mg | ORAL_TABLET | Freq: Two times a day (BID) | ORAL | Status: DC | PRN

## 2012-02-05 NOTE — Telephone Encounter (Signed)
Omeprazole and Naproxen - see meds Thx

## 2012-02-05 NOTE — Telephone Encounter (Signed)
Vimovo PA was denied. Please advise what else pt can take.

## 2012-02-06 NOTE — Telephone Encounter (Signed)
Left message with pt's mother to have pt return my call. 

## 2012-02-10 MED ORDER — OMEPRAZOLE 40 MG PO CPDR: 40.0000 mg | DELAYED_RELEASE_CAPSULE | Freq: Every day | ORAL | Status: DC

## 2012-02-10 NOTE — Telephone Encounter (Signed)
Pt informed

## 2012-04-08 ENCOUNTER — Other Ambulatory Visit: Payer: Self-pay | Admitting: Internal Medicine

## 2012-04-19 ENCOUNTER — Other Ambulatory Visit: Payer: Self-pay | Admitting: Internal Medicine

## 2012-05-16 ENCOUNTER — Other Ambulatory Visit: Payer: Self-pay | Admitting: Internal Medicine

## 2012-06-13 ENCOUNTER — Other Ambulatory Visit: Payer: Self-pay | Admitting: Internal Medicine

## 2012-06-15 ENCOUNTER — Other Ambulatory Visit: Payer: Self-pay | Admitting: General Practice

## 2012-06-15 MED ORDER — FUROSEMIDE 20 MG PO TABS: 20.0000 mg | ORAL_TABLET | Freq: Every day | ORAL | Status: DC

## 2012-06-17 ENCOUNTER — Other Ambulatory Visit: Payer: Self-pay | Admitting: Internal Medicine

## 2012-07-28 ENCOUNTER — Other Ambulatory Visit: Payer: Self-pay | Admitting: Internal Medicine

## 2012-08-10 ENCOUNTER — Other Ambulatory Visit: Payer: Self-pay | Admitting: Internal Medicine

## 2012-09-07 ENCOUNTER — Encounter: Payer: Self-pay | Admitting: Internal Medicine

## 2012-09-07 ENCOUNTER — Ambulatory Visit (INDEPENDENT_AMBULATORY_CARE_PROVIDER_SITE_OTHER): Payer: BC Managed Care – PPO | Admitting: Internal Medicine

## 2012-09-07 VITALS — BP 130/74 | HR 76 | Temp 98.7°F | Resp 16 | Ht 64.0 in | Wt 252.0 lb

## 2012-09-07 DIAGNOSIS — Z0000 Encounter for general adult medical examination without abnormal findings: Secondary | ICD-10-CM

## 2012-09-07 DIAGNOSIS — G5711 Meralgia paresthetica, right lower limb: Secondary | ICD-10-CM | POA: Insufficient documentation

## 2012-09-07 DIAGNOSIS — F17200 Nicotine dependence, unspecified, uncomplicated: Secondary | ICD-10-CM

## 2012-09-07 DIAGNOSIS — E669 Obesity, unspecified: Secondary | ICD-10-CM

## 2012-09-07 DIAGNOSIS — G5710 Meralgia paresthetica, unspecified lower limb: Secondary | ICD-10-CM

## 2012-09-07 DIAGNOSIS — M1990 Unspecified osteoarthritis, unspecified site: Secondary | ICD-10-CM

## 2012-09-07 DIAGNOSIS — I10 Essential (primary) hypertension: Secondary | ICD-10-CM

## 2012-09-07 MED ORDER — TRAMADOL HCL 50 MG PO TABS: 50.0000 mg | ORAL_TABLET | Freq: Two times a day (BID) | ORAL | Status: DC | PRN

## 2012-09-07 MED ORDER — FUROSEMIDE 20 MG PO TABS: 20.0000 mg | ORAL_TABLET | Freq: Every day | ORAL | Status: DC

## 2012-09-07 MED ORDER — BETAMETHASONE DIPROPIONATE AUG 0.05 % EX CREA: TOPICAL_CREAM | Freq: Two times a day (BID) | CUTANEOUS | Status: DC

## 2012-09-07 MED ORDER — CARVEDILOL 25 MG PO TABS: 25.0000 mg | ORAL_TABLET | Freq: Two times a day (BID) | ORAL | Status: DC

## 2012-09-07 MED ORDER — NABUMETONE 750 MG PO TABS: 750.0000 mg | ORAL_TABLET | Freq: Two times a day (BID) | ORAL | Status: DC | PRN

## 2012-09-07 MED ORDER — BUDESONIDE-FORMOTEROL FUMARATE 160-4.5 MCG/ACT IN AERO: 2.0000 | INHALATION_SPRAY | Freq: Two times a day (BID) | RESPIRATORY_TRACT | Status: DC

## 2012-09-07 NOTE — Assessment & Plan Note (Signed)
Knees mostly B - worse in 9/13

## 2012-09-07 NOTE — Assessment & Plan Note (Signed)
Wt Readings from Last 3 Encounters:  09/07/12 252 lb (114.306 kg)  10/26/10 250 lb (113.399 kg)  07/25/10 248 lb (112.492 kg)

## 2012-09-07 NOTE — Assessment & Plan Note (Signed)
Quit 7/13

## 2012-09-07 NOTE — Progress Notes (Signed)
  Subjective:    Patient ID: [name], female    DOB: 03-20-1956, 56 y.o.   MRN: [id number]  HPI  The patient presents for a follow-up of  chronic hypertension, chronic dyslipidemia, obesity controlled C/o pain and numbness in the L thigh x weeks C/o wt gain - she quit smoking  BP Readings from Last 3 Encounters:  09/07/12 130/74  10/26/10 116/72  07/25/10 130/80   Wt Readings from Last 3 Encounters:  09/07/12 252 lb (114.306 kg)  10/26/10 250 lb (113.399 kg)  07/25/10 248 lb (112.492 kg)       Review of Systems  Constitutional: Positive for unexpected weight change. Negative for chills, activity change, appetite change and fatigue.  HENT: Negative for congestion, mouth sores and sinus pressure.   Eyes: Negative for visual disturbance.  Respiratory: Negative for cough and chest tightness.   Gastrointestinal: Negative for nausea, abdominal pain, diarrhea and constipation.  Genitourinary: Negative for frequency, difficulty urinating and vaginal pain.  Musculoskeletal: Negative for back pain and gait problem.  Skin: Negative for pallor and rash.  Neurological: Negative for dizziness, tremors, weakness, numbness and headaches.  Psychiatric/Behavioral: Negative for suicidal ideas, confusion and disturbed wake/sleep cycle. The patient is not nervous/anxious.        Objective:   Physical Exam  Constitutional: She appears well-developed. No distress.       obese  HENT:  Head: Normocephalic.  Right Ear: External ear normal.  Left Ear: External ear normal.  Nose: Nose normal.  Mouth/Throat: Oropharynx is clear and moist.  Eyes: Conjunctivae normal are normal. Pupils are equal, round, and reactive to light. Right eye exhibits no discharge. Left eye exhibits no discharge.  Neck: Normal range of motion. Neck supple. No JVD present. No tracheal deviation present. No thyromegaly present.  Cardiovascular: Normal rate, regular rhythm and normal heart sounds.   Pulmonary/Chest: No  stridor. No respiratory distress. She has no wheezes.  Abdominal: Soft. Bowel sounds are normal. She exhibits no distension and no mass. There is no tenderness. There is no rebound and no guarding.  Musculoskeletal: She exhibits no edema and no tenderness.  Lymphadenopathy:    She has no cervical adenopathy.  Neurological: She displays normal reflexes. No cranial nerve deficit. She exhibits normal muscle tone. Coordination normal.  Skin: No rash noted. No erythema.  Psychiatric: She has a normal mood and affect. Her behavior is normal. Judgment and thought content normal.   Lab Results  Component Value Date   WBC 7.2 07/25/2010   HGB 12.4 07/25/2010   HCT 36.3 07/25/2010   PLT 241.0 07/25/2010   GLUCOSE 102* 10/26/2010   CHOL 169 10/26/2010   TRIG 104.0 10/26/2010   HDL 35.30* 10/26/2010   LDLCALC 113* 10/26/2010   ALT 17 07/25/2010   AST 16 07/25/2010   NA 141 10/26/2010   K 4.5 10/26/2010   CL 103 10/26/2010   CREATININE 0.9 10/26/2010   BUN 20 10/26/2010   CO2 29 10/26/2010   TSH 1.05 07/25/2010   HGBA1C 6.3 10/26/2010          Assessment & Plan:

## 2012-09-07 NOTE — Assessment & Plan Note (Signed)
Continue with current prescription therapy as reflected on the Med list.  

## 2012-09-07 NOTE — Patient Instructions (Addendum)
Belviq; for weight loss?

## 2012-09-07 NOTE — Assessment & Plan Note (Signed)
Discussed treatment, wt loss

## 2012-09-21 ENCOUNTER — Other Ambulatory Visit: Payer: Self-pay | Admitting: Internal Medicine

## 2012-09-28 ENCOUNTER — Other Ambulatory Visit (INDEPENDENT_AMBULATORY_CARE_PROVIDER_SITE_OTHER): Payer: BC Managed Care – PPO

## 2012-09-28 DIAGNOSIS — Z0000 Encounter for general adult medical examination without abnormal findings: Secondary | ICD-10-CM

## 2012-09-28 DIAGNOSIS — E669 Obesity, unspecified: Secondary | ICD-10-CM

## 2012-09-28 DIAGNOSIS — G5710 Meralgia paresthetica, unspecified lower limb: Secondary | ICD-10-CM

## 2012-09-28 DIAGNOSIS — G5711 Meralgia paresthetica, right lower limb: Secondary | ICD-10-CM

## 2012-09-28 DIAGNOSIS — M1990 Unspecified osteoarthritis, unspecified site: Secondary | ICD-10-CM

## 2012-09-28 DIAGNOSIS — F17200 Nicotine dependence, unspecified, uncomplicated: Secondary | ICD-10-CM

## 2012-09-28 DIAGNOSIS — I10 Essential (primary) hypertension: Secondary | ICD-10-CM

## 2012-09-28 LAB — CBC WITH DIFFERENTIAL/PLATELET
Basophils Absolute: 0 10*3/uL (ref 0.0–0.1)
Eosinophils Absolute: 0.3 10*3/uL (ref 0.0–0.7)
HCT: 39.5 % (ref 36.0–46.0)
Hemoglobin: 12.9 g/dL (ref 12.0–15.0)
Lymphocytes Relative: 21.1 % (ref 12.0–46.0)
Lymphs Abs: 1.4 10*3/uL (ref 0.7–4.0)
MCHC: 32.7 g/dL (ref 30.0–36.0)
Neutro Abs: 4.5 10*3/uL (ref 1.4–7.7)
Platelets: 227 10*3/uL (ref 150.0–400.0)
RDW: 13.6 % (ref 11.5–14.6)

## 2012-09-28 LAB — URINALYSIS, ROUTINE W REFLEX MICROSCOPIC
Leukocytes, UA: NEGATIVE
Nitrite: NEGATIVE
Specific Gravity, Urine: >=1.03 (ref 1.000–1.030)
Urobilinogen, UA: 0.2 (ref 0.0–1.0)
pH: 5.5 (ref 5.0–8.0)

## 2012-09-28 LAB — HEPATIC FUNCTION PANEL
Bilirubin, Direct: 0.1 mg/dL (ref 0.0–0.3)
Total Bilirubin: 0.4 mg/dL (ref 0.3–1.2)

## 2012-09-28 LAB — BASIC METABOLIC PANEL
Calcium: 9.4 mg/dL (ref 8.4–10.5)
Creatinine, Ser: 0.8 mg/dL (ref 0.4–1.2)
Sodium: 141 mEq/L (ref 135–145)

## 2012-09-28 LAB — TSH: TSH: 1.04 u[IU]/mL (ref 0.35–5.50)

## 2012-09-28 LAB — LIPID PANEL: Cholesterol: 182 mg/dL (ref 0–200)

## 2012-10-01 LAB — VITAMIN B12: Vitamin B-12: 300 pg/mL (ref 211–911)

## 2012-11-20 ENCOUNTER — Encounter: Payer: BC Managed Care – PPO | Admitting: Internal Medicine

## 2012-11-20 DIAGNOSIS — Z0289 Encounter for other administrative examinations: Secondary | ICD-10-CM

## 2012-12-09 HISTORY — PX: COLONOSCOPY: SHX174

## 2012-12-23 ENCOUNTER — Encounter: Payer: BC Managed Care – PPO | Admitting: Internal Medicine

## 2013-01-01 ENCOUNTER — Ambulatory Visit (INDEPENDENT_AMBULATORY_CARE_PROVIDER_SITE_OTHER): Payer: BC Managed Care – PPO | Admitting: Internal Medicine

## 2013-01-01 ENCOUNTER — Encounter: Payer: Self-pay | Admitting: Internal Medicine

## 2013-01-01 VITALS — BP 130/70 | HR 80 | Temp 98.7°F | Resp 16 | Ht 64.0 in | Wt 253.0 lb

## 2013-01-01 DIAGNOSIS — M25559 Pain in unspecified hip: Secondary | ICD-10-CM

## 2013-01-01 DIAGNOSIS — I10 Essential (primary) hypertension: Secondary | ICD-10-CM

## 2013-01-01 DIAGNOSIS — E669 Obesity, unspecified: Secondary | ICD-10-CM

## 2013-01-01 DIAGNOSIS — M25551 Pain in right hip: Secondary | ICD-10-CM

## 2013-01-01 DIAGNOSIS — Z0000 Encounter for general adult medical examination without abnormal findings: Secondary | ICD-10-CM | POA: Insufficient documentation

## 2013-01-01 DIAGNOSIS — M25569 Pain in unspecified knee: Secondary | ICD-10-CM

## 2013-01-01 MED ORDER — ASPIRIN 81 MG PO TBEC: 81.0000 mg | DELAYED_RELEASE_TABLET | Freq: Every day | ORAL | Status: DC

## 2013-01-01 MED ORDER — DICLOFENAC SODIUM 1 % TD GEL: 4.0000 g | Freq: Four times a day (QID) | TRANSDERMAL | Status: DC

## 2013-01-01 MED ORDER — TRAMADOL HCL 50 MG PO TABS: 50.0000 mg | ORAL_TABLET | Freq: Two times a day (BID) | ORAL | Status: DC | PRN

## 2013-01-01 MED ORDER — BETAMETHASONE DIPROPIONATE AUG 0.05 % EX CREA: TOPICAL_CREAM | Freq: Two times a day (BID) | CUTANEOUS | Status: DC

## 2013-01-01 MED ORDER — MELOXICAM 15 MG PO TABS: 15.0000 mg | ORAL_TABLET | Freq: Every day | ORAL | Status: DC | PRN

## 2013-01-01 MED ORDER — FUROSEMIDE 20 MG PO TABS: 20.0000 mg | ORAL_TABLET | Freq: Every day | ORAL | Status: DC

## 2013-01-01 MED ORDER — CARVEDILOL 25 MG PO TABS: 25.0000 mg | ORAL_TABLET | Freq: Two times a day (BID) | ORAL | Status: DC

## 2013-01-01 NOTE — Patient Instructions (Addendum)
IT band stretch -[email or web address].com  Postprocedure instructions :    A Band-Aid should be left on for 12 hours. Injection therapy is not a cure itself. It is used in conjunction with other modalities. You can use nonsteroidal anti-inflammatories like ibuprofen , hot and cold compresses. Rest is recommended in the next 24 hours. You need to report immediately  if fever, chills or any signs of infection develop.

## 2013-01-01 NOTE — Assessment & Plan Note (Signed)
We discussed age appropriate health related issues, including available/recomended screening tests and vaccinations. We discussed a need for adhering to healthy diet and exercise. Labs/EKG were reviewed/ordered. All questions were answered.   

## 2013-01-01 NOTE — Assessment & Plan Note (Signed)
Continue with current prescription therapy as reflected on the Med list.  

## 2013-01-01 NOTE — Progress Notes (Signed)
Subjective:  HPI  The patient is here for a wellness exam. The patient has been doing well overall without major physical or psychological issues going on lately.  The patient presents for a follow-up of  chronic hypertension, chronic dyslipidemia, obesity controlled.  C/o pain and numbness in the L thigh x weeks - better C/o wt gain - she quit smoking.  C/o R knee pain and stiffness - severe. C/o R thigh lateral pain as well.  BP Readings from Last 3 Encounters:  01/01/13 130/70  09/07/12 130/74  10/26/10 116/72   Wt Readings from Last 3 Encounters:  01/01/13 253 lb (114.76 kg)  09/07/12 252 lb (114.306 kg)  10/26/10 250 lb (113.399 kg)    Review of Systems  Constitutional: Positive for unexpected weight change. Negative for chills, activity change, appetite change and fatigue.  HENT: Negative for congestion, mouth sores and sinus pressure.   Eyes: Negative for visual disturbance.  Respiratory: Negative for cough and chest tightness.   Gastrointestinal: Negative for nausea, abdominal pain, diarrhea and constipation.  Genitourinary: Negative for frequency, difficulty urinating and vaginal pain.  Musculoskeletal: Negative for back pain and gait problem.  Skin: Negative for pallor and rash.  Neurological: Negative for dizziness, tremors, weakness, numbness and headaches.  Psychiatric/Behavioral: Negative for suicidal ideas, confusion and sleep disturbance. The patient is not nervous/anxious.        Objective:   Physical Exam  Constitutional: She appears well-developed. No distress.       obese  HENT:  Head: Normocephalic.  Right Ear: External ear normal.  Left Ear: External ear normal.  Nose: Nose normal.  Mouth/Throat: Oropharynx is clear and moist.  Eyes: Conjunctivae normal are normal. Pupils are equal, round, and reactive to light. Right eye exhibits no discharge. Left eye exhibits no discharge.  Neck: Normal range of motion. Neck supple. No JVD present. No  tracheal deviation present. No thyromegaly present.  Cardiovascular: Normal rate, regular rhythm and normal heart sounds.   Pulmonary/Chest: No stridor. No respiratory distress. She has no wheezes.  Abdominal: Soft. Bowel sounds are normal. She exhibits no distension and no mass. There is no tenderness. There is no rebound and no guarding.  Musculoskeletal: She exhibits no edema and no tenderness.  Lymphadenopathy:    She has no cervical adenopathy.  Neurological: She displays normal reflexes. No cranial nerve deficit. She exhibits normal muscle tone. Coordination normal.  Skin: No rash noted. No erythema.  Psychiatric: She has a normal mood and affect. Her behavior is normal. Judgment and thought content normal.  R knee is tender R lat thigh is tender Lab Results  Component Value Date   WBC 6.8 09/28/2012   HGB 12.9 09/28/2012   HCT 39.5 09/28/2012   PLT 227.0 09/28/2012   GLUCOSE 90 09/28/2012   CHOL 182 09/28/2012   TRIG 102.0 09/28/2012   HDL 37.30* 09/28/2012   LDLCALC 124* 09/28/2012   ALT 18 09/28/2012   AST 15 09/28/2012   NA 141 09/28/2012   K 4.3 09/28/2012   CL 105 09/28/2012   CREATININE 0.8 09/28/2012   BUN 14 09/28/2012   CO2 31 09/28/2012   TSH 1.04 09/28/2012   HGBA1C 6.3 10/26/2010    Procedure Note :     Procedure : Joint Injection,  R knee   Indication:  Joint osteoarthritis with refractory  chronic pain.   Risks including unsuccessful procedure , bleeding, infection, bruising, skin atrophy and others were explained to the patient in detail as well as the  benefits. Informed consent was obtained and signed.   Tthe patient was placed in a comfortable position. Lateral approach was used. Skin was prepped with Betadine and alcohol  and anesthetized a cooling spray. Then, a 5 cc syringe with a 1.5 inch long 25-gauge needle was used for a joint injection.. The needle was advanced  Into the knee joint cavity. I aspirated a small amount of intra-articular fluid to  confirm correct placement of the needle and injected the joint with 5 mL of 2% lidocaine and 40 mg of Depo-Medrol .  Band-Aid was applied.   Tolerated well. Complications: None. Good pain relief following the procedure.   Postprocedure instructions :    A Band-Aid should be left on for 12 hours. Injection therapy is not a cure itself. It is used in conjunction with other modalities. You can use nonsteroidal anti-inflammatories like ibuprofen  hot and cold compresses. Rest is recommended in the next 24 hours. You need to report immediately  if fever, chills or any signs of infection develop.        Assessment & Plan:

## 2013-01-03 ENCOUNTER — Encounter: Payer: Self-pay | Admitting: Internal Medicine

## 2013-01-03 DIAGNOSIS — M25551 Pain in right hip: Secondary | ICD-10-CM | POA: Insufficient documentation

## 2013-01-03 NOTE — Assessment & Plan Note (Signed)
Likely related to R knee OA pain and gait change Stretch exercises Inj offered

## 2013-01-03 NOTE — Assessment & Plan Note (Signed)
Discussed diet  

## 2013-01-03 NOTE — Assessment & Plan Note (Addendum)
She asked for an injection. Ortho cons was offered

## 2013-01-26 ENCOUNTER — Encounter: Payer: Self-pay | Admitting: Internal Medicine

## 2013-01-26 ENCOUNTER — Telehealth: Payer: Self-pay | Admitting: Internal Medicine

## 2013-01-26 ENCOUNTER — Ambulatory Visit (INDEPENDENT_AMBULATORY_CARE_PROVIDER_SITE_OTHER): Payer: BC Managed Care – PPO | Admitting: Internal Medicine

## 2013-01-26 VITALS — BP 140/70 | HR 76 | Temp 98.4°F | Resp 16 | Wt 250.0 lb

## 2013-01-26 DIAGNOSIS — I10 Essential (primary) hypertension: Secondary | ICD-10-CM

## 2013-01-26 DIAGNOSIS — E669 Obesity, unspecified: Secondary | ICD-10-CM

## 2013-01-26 DIAGNOSIS — M545 Low back pain, unspecified: Secondary | ICD-10-CM | POA: Insufficient documentation

## 2013-01-26 DIAGNOSIS — J069 Acute upper respiratory infection, unspecified: Secondary | ICD-10-CM

## 2013-01-26 DIAGNOSIS — M549 Dorsalgia, unspecified: Secondary | ICD-10-CM | POA: Insufficient documentation

## 2013-01-26 MED ORDER — AZITHROMYCIN 250 MG PO TABS: ORAL_TABLET | ORAL | Status: DC

## 2013-01-26 MED ORDER — METAXALONE 800 MG PO TABS: 400.0000 mg | ORAL_TABLET | Freq: Three times a day (TID) | ORAL | Status: DC | PRN

## 2013-01-26 MED ORDER — KETOROLAC TROMETHAMINE 60 MG/2ML IM SOLN
60.0000 mg | Freq: Once | INTRAMUSCULAR | Status: AC
  Administered 2013-01-26: 60 mg via INTRAMUSCULAR

## 2013-01-26 MED ORDER — OMEPRAZOLE 40 MG PO CPDR: 40.0000 mg | DELAYED_RELEASE_CAPSULE | Freq: Every day | ORAL | Status: DC

## 2013-01-26 NOTE — Assessment & Plan Note (Signed)
2/14 new MSK - severe Toradol IM Cont Meloxicam Tramadol bid- qid prn

## 2013-01-26 NOTE — Assessment & Plan Note (Signed)
Wt Readings from Last 3 Encounters:  01/26/13 250 lb (113.399 kg)  01/01/13 253 lb (114.76 kg)  09/07/12 252 lb (114.306 kg)

## 2013-01-26 NOTE — Assessment & Plan Note (Signed)
Zpac if worse 

## 2013-01-26 NOTE — Patient Instructions (Addendum)
Stretch back Use Tramadol 2-4 times a day for the back pain

## 2013-01-26 NOTE — Assessment & Plan Note (Signed)
Continue with current prescription therapy as reflected on the Med list.  

## 2013-01-26 NOTE — Telephone Encounter (Signed)
Patient Information:  Caller Name: Azelia  Phone: [phone]  Patient: [name], [name]  Gender: Female  DOB: 01-May-1956  Age: 57 Years  PCP: Plotnikov, Alex (Adults only)  Office Follow Up:  Does the office need to follow up with this patient?: No  Instructions For The Office: N/A  RN Note:  Severe low back pain began after sneezing fit. Initially, was unable to stand. Pain currently rated 8/10. Able to get up slowly.Been using heat to back which made it stiff.  Reluctant to schedule appointment since she does not know how she can get to office or down the stairs.  Agreed she needs to be seen and evaluated.  Scheduled for last remaining appointment for Dr Posey Rea.  Agreed to try to make arrangements to get to office.    Symptoms  Reason For Call & Symptoms: Low back pain after sneezing fit; "threw back out."  Reviewed Health History In EMR: Yes  Reviewed Medications In EMR: Yes  Reviewed Allergies In EMR: Yes  Reviewed Surgeries / Procedures: Yes  Date of Onset of Symptoms: 01/24/2013  Treatments Tried: lying still, heat application  Treatments Tried Worked: No  Guideline(s) Used:  Back Pain  Disposition Per Guideline:   Go to Office Now  Reason For Disposition Reached:   Severe back pain  Advice Given:  Cold or Heat:  Cold Pack: For pain or swelling, use a cold pack or ice wrapped in a wet cloth. Put it on the sore area for 20 minutes. Repeat 4 times on the first day, then as needed.  Heat Pack: If pain lasts over 2 days, apply heat to the sore area. Use a heat pack, heating pad, or warm wet washcloth. Do this for 10 minutes, then as needed. For widespread stiffness, take a hot bath or hot shower instead. Move the sore area under the warm water.  Sleep:  Sleep on your side with a pillow between your knees. If you sleep on your back, put a pillow under your knees.  Avoid sleeping on your stomach.  Your mattress should be firm. Avoid waterbeds.  Activity  Keep doing  your day-to-day activities if it is not too painful. Staying active is better than resting.  Avoid anything that makes your pain worse. Avoid heavy lifting, twisting, and too much exercise until your back heals.  You do not need to stay in bed.  Pain Medicines:  For pain relief, take acetaminophen, ibuprofen, or naproxen.  Ibuprofen (e.g., Motrin, Advil):  Take 400 mg (two 200 mg pills) by mouth every 6 hours.  Another choice is to take 600 mg (three 200 mg pills) by mouth every 8 hours.  The most you should take each day is 1,200 mg (six 200 mg pills), unless your doctor has told you to take more.  Call Back If:  Numbness or weakness occur  Bowel/bladder problems occur  Pain lasts for more than 2 weeks  You become worse.  Appointment Scheduled:  01/26/2013 16:15:00 Appointment Scheduled Provider:  Sonda Primes (Adults only)

## 2013-01-26 NOTE — Progress Notes (Signed)
Patient ID: [name], female   DOB: [birth year], 57 y.o.   MRN: [id number]   Subjective:  Back Pain This is a new problem. The current episode started in the past 7 days. The problem occurs constantly. The problem has been waxing and waning since onset. The pain is present in the lumbar spine. The quality of the pain is described as aching and shooting. The pain is at a severity of 9/10. The pain is severe. The pain is worse during the day. The symptoms are aggravated by lying down, bending, position, standing, sitting and twisting. Pertinent negatives include no abdominal pain, headaches, numbness or weakness. She has tried analgesics, NSAIDs and walking for the symptoms. The treatment provided moderate relief.    The patient is here for a wellness exam. The patient has been doing well overall without major physical or psychological issues going on lately.  The patient presents for a follow-up of  chronic hypertension, chronic dyslipidemia, obesity controlled.  C/o pain and numbness in the L thigh x weeks - better C/o wt gain - she quit smoking.  C/o R knee pain and stiffness - severe. C/o R thigh lateral pain as well.  BP Readings from Last 3 Encounters:  01/26/13 140/70  01/01/13 130/70  09/07/12 130/74   Wt Readings from Last 3 Encounters:  01/26/13 250 lb (113.399 kg)  01/01/13 253 lb (114.76 kg)  09/07/12 252 lb (114.306 kg)    Review of Systems  Constitutional: Positive for unexpected weight change. Negative for chills, activity change, appetite change and fatigue.  HENT: Negative for congestion, mouth sores and sinus pressure.   Eyes: Negative for visual disturbance.  Respiratory: Negative for cough and chest tightness.   Gastrointestinal: Negative for nausea, abdominal pain, diarrhea and constipation.  Genitourinary: Negative for frequency, difficulty urinating and vaginal pain.  Musculoskeletal: Positive for back pain. Negative for arthralgias and gait problem.  Skin:  Negative for pallor and rash.  Neurological: Negative for dizziness, tremors, weakness, numbness and headaches.  Psychiatric/Behavioral: Negative for suicidal ideas, confusion and sleep disturbance. The patient is not nervous/anxious.        Objective:   Physical Exam  Constitutional: She appears well-developed. No distress.  obese  HENT:  Head: Normocephalic.  Right Ear: External ear normal.  Left Ear: External ear normal.  Nose: Nose normal.  Mouth/Throat: Oropharynx is clear and moist.  Eyes: Conjunctivae are normal. Pupils are equal, round, and reactive to light. Right eye exhibits no discharge. Left eye exhibits no discharge.  Neck: Normal range of motion. Neck supple. No JVD present. No tracheal deviation present. No thyromegaly present.  Cardiovascular: Normal rate, regular rhythm and normal heart sounds.   Pulmonary/Chest: No stridor. No respiratory distress. She has no wheezes.  Abdominal: Soft. Bowel sounds are normal. She exhibits no distension and no mass. There is no tenderness. There is no rebound and no guarding.  Musculoskeletal: She exhibits tenderness. She exhibits no edema.  LS is tender w/ROM  Lymphadenopathy:    She has no cervical adenopathy.  Neurological: She displays normal reflexes. No cranial nerve deficit. She exhibits normal muscle tone. Coordination normal.  Skin: No rash noted. No erythema.  Psychiatric: She has a normal mood and affect. Her behavior is normal. Judgment and thought content normal.  R knee is less tender R lat thigh is tender Lab Results  Component Value Date   WBC 6.8 09/28/2012   HGB 12.9 09/28/2012   HCT 39.5 09/28/2012   PLT 227.0 09/28/2012  GLUCOSE 90 09/28/2012   CHOL 182 09/28/2012   TRIG 102.0 09/28/2012   HDL 37.30* 09/28/2012   LDLCALC 124* 09/28/2012   ALT 18 09/28/2012   AST 15 09/28/2012   NA 141 09/28/2012   K 4.3 09/28/2012   CL 105 09/28/2012   CREATININE 0.8 09/28/2012   BUN 14 09/28/2012   CO2 31  09/28/2012   TSH 1.04 09/28/2012   HGBA1C 6.3 10/26/2010           Assessment & Plan:

## 2013-01-27 ENCOUNTER — Encounter: Payer: Self-pay | Admitting: Internal Medicine

## 2013-02-21 ENCOUNTER — Other Ambulatory Visit: Payer: Self-pay | Admitting: Internal Medicine

## 2013-04-26 ENCOUNTER — Telehealth: Payer: Self-pay | Admitting: Internal Medicine

## 2013-04-26 DIAGNOSIS — G8929 Other chronic pain: Secondary | ICD-10-CM

## 2013-04-26 NOTE — Telephone Encounter (Signed)
Pt c/o Right knee pain. She is requesting a name of who you prefer.

## 2013-04-26 NOTE — Telephone Encounter (Signed)
Patient is requesting a call back from [facility]

## 2013-04-27 NOTE — Telephone Encounter (Signed)
What name? Thx

## 2013-04-28 NOTE — Telephone Encounter (Signed)
Sorry! The name of a Orthopedist you would recommend she see for her knee pain.

## 2013-04-28 NOTE — Telephone Encounter (Signed)
Dr Charlann Boxer - done Thx

## 2013-04-28 NOTE — Telephone Encounter (Signed)
Pt informed

## 2013-07-12 ENCOUNTER — Encounter (HOSPITAL_COMMUNITY): Payer: Self-pay | Admitting: Pharmacy Technician

## 2013-07-12 NOTE — Patient Instructions (Signed)
[name]  07/12/2013   Your procedure is scheduled on:  07/20/2013             Surgery 1140am-110pm  Report to [facility] at    0840 AM.  Call this number if you have problems the morning of surgery: [phone]   Remember:   Do not eat food or drink liquids after midnight.   Take these medicines the morning of surgery with A SIP OF WATER:    Do not wear jewelry, make-up or nail polish.  Do not wear lotions, powders, or perfumes.   Do not shave 48 hours prior to surgery.   Do not bring valuables to the hospital.  Contacts, dentures or bridgework may not be worn into surgery.  Leave suitcase in the car. After surgery it may be brought to your room.  For patients admitted to the hospital, checkout time is 11:00 AM the day of  discharge.   SEE CHG INSTRUCTION SHEET    Please read over the following fact sheets that you were given: MRSA Information, coughing and deep breathing exercises, leg exercises, Blood Transfusion Fact Sheet, Incentive Spirometry Fact Sheet                Failure to comply with these instructions may result in cancellation of your surgery.                Patient Signature ____________________________              Nurse Signature _____________________________

## 2013-07-13 ENCOUNTER — Encounter (HOSPITAL_COMMUNITY)
Admission: RE | Admit: 2013-07-13 | Discharge: 2013-07-13 | Disposition: A | Discharge status: Home / Self Care | Payer: BC Managed Care – PPO | Source: Ambulatory Visit | Attending: Orthopedic Surgery | Admitting: Orthopedic Surgery

## 2013-07-13 ENCOUNTER — Encounter (HOSPITAL_COMMUNITY): Payer: Self-pay

## 2013-07-13 ENCOUNTER — Ambulatory Visit (HOSPITAL_COMMUNITY)
Admission: RE | Admit: 2013-07-13 | Discharge: 2013-07-13 | Disposition: A | Discharge status: Home / Self Care | Payer: BC Managed Care – PPO | Source: Ambulatory Visit | Attending: Orthopedic Surgery | Admitting: Orthopedic Surgery

## 2013-07-13 DIAGNOSIS — Z01812 Encounter for preprocedural laboratory examination: Secondary | ICD-10-CM | POA: Insufficient documentation

## 2013-07-13 DIAGNOSIS — Z01818 Encounter for other preprocedural examination: Secondary | ICD-10-CM | POA: Insufficient documentation

## 2013-07-13 DIAGNOSIS — M1710 Unilateral primary osteoarthritis, unspecified knee: Secondary | ICD-10-CM | POA: Insufficient documentation

## 2013-07-13 LAB — BASIC METABOLIC PANEL
CO2: 32 mEq/L (ref 19–32)
Chloride: 104 mEq/L (ref 96–112)
Creatinine, Ser: 0.91 mg/dL (ref 0.50–1.10)
Glucose, Bld: 97 mg/dL (ref 70–99)

## 2013-07-13 LAB — URINALYSIS, ROUTINE W REFLEX MICROSCOPIC
Leukocytes, UA: NEGATIVE
Nitrite: NEGATIVE
Protein, ur: NEGATIVE mg/dL
Specific Gravity, Urine: 1.016 (ref 1.005–1.030)
Urobilinogen, UA: 0.2 mg/dL (ref 0.0–1.0)

## 2013-07-13 LAB — CBC
HCT: 40.2 % (ref 36.0–46.0)
MCH: 29.2 pg (ref 26.0–34.0)
MCV: 91.8 fL (ref 78.0–100.0)
RBC: 4.38 MIL/uL (ref 3.87–5.11)
WBC: 7.7 10*3/uL (ref 4.0–10.5)

## 2013-07-13 LAB — SURGICAL PCR SCREEN: MRSA, PCR: NEGATIVE

## 2013-07-13 LAB — APTT: aPTT: 33 seconds (ref 24–37)

## 2013-07-13 NOTE — Progress Notes (Signed)
Your patient has screened at an elevated risk for Obstructive Sleep Apnea using the STOP-Bang Tool during a presurgical visit.  A score of 4 or greater is considered an elevated risk.   

## 2013-07-18 NOTE — H&P (Signed)
TOTAL HIP ADMISSION H&P  Patient is admitted for right total hip arthroplasty, anterior approach.  Subjective:  Chief Complaint: right hip pain  HPI: [name], 57 y.o. female, has a history of pain and functional disability in the right hip(s) due to arthritis and patient has failed non-surgical conservative treatments for greater than 12 weeks to include NSAID's and/or analgesics and activity modification.  Onset of symptoms was gradual starting 7 years ago with rapidlly worsening over the last 3.5 years.The patient noted no past surgery on the right hip(s).  Patient currently rates pain in the right hip at 10 out of 10 with activity. Patient has night pain, worsening of pain with activity and weight bearing, trendelenberg gait, pain that interfers with activities of daily living and pain with passive range of motion. Patient has evidence of periarticular osteophytes and joint space narrowing by imaging studies. This condition presents safety issues increasing the risk of falls.  There is no current active infection.  Risks, benefits and expectations were discussed with the patient. Patient understand the risks, benefits and expectations and wishes to proceed with surgery.   D/C Plans:   Home with HHPT  Post-op Meds:  Rx given for ASA, Robaxin, Iron, Colace and MiraLax  Tranexamic Acid:   To be given  Decadron:    To be given  FYI:    ASA post-op   Patient Active Problem List   Diagnosis Date Noted  . URI, acute 01/26/2013  . LBP (low back pain) 01/26/2013  . Right hip pain 01/03/2013  . Well adult exam 01/01/2013  . Meralgia paresthetica of right side 09/07/2012  . Tobacco dependence 09/07/2012  . HIP PAIN 10/26/2010  . CHRONIC OBSTRUCTIVE ASTHMA WITH EXACERBATION 01/25/2010  . KNEE PAIN 10/09/2009  . ABNORMAL GLUCOSE NEC 01/27/2009  . OBESITY 10/21/2008  . WEIGHT GAIN 06/17/2008  . DIZZINESS 03/24/2008  . EDEMA 03/24/2008  . HYPERTENSION 11/24/2007  . ALLERGIC RHINITIS  11/24/2007  . GERD 11/24/2007  . OSTEOARTHRITIS 11/24/2007  . FLATULENCE 11/24/2007   Past Medical History  Diagnosis Date  . Allergic rhinitis   . HTN (hypertension)   . Osteoarthritis     Right Hip - SEVERE  . GERD (gastroesophageal reflux disease)   . Abnormal glucose 2009    Past Surgical History  Procedure Laterality Date  . Hole repair in heart      2004 at [facility]   . Cholecystectomy    . Knee arthroscopy      left     No prescriptions prior to admission   Allergies  Allergen Reactions  . Hydrochlorothiazide     REACTION: cramps  . Phentermine Hcl     REACTION: dizzy  . Valsartan     REACTION: side effect  . Verapamil     REACTION: ? lightheaded    History  Substance Use Topics  . Smoking status: Former Smoker    Quit date: 06/08/2012  . Smokeless tobacco: Never Used     Comment: 2009  . Alcohol Use: 4.2 oz/week    7 Glasses of wine per week    Family History  Problem Relation Age of Onset  . Hypertension    . Parkinsonism Mother   . Hypertension Mother      Review of Systems  Constitutional: Negative.   HENT: Negative.   Eyes: Negative.   Respiratory: Negative.   Cardiovascular: Negative.   Gastrointestinal: Positive for heartburn.  Genitourinary: Negative.   Musculoskeletal: Positive for back pain and joint pain.  Skin: Negative.   Neurological: Negative.   Endo/Heme/Allergies: Positive for environmental allergies.  Psychiatric/Behavioral: Negative.     Objective:  Physical Exam  Constitutional: She is oriented to person, place, and time. She appears well-developed and well-nourished.  HENT:  Head: Normocephalic and atraumatic.  Mouth/Throat: Oropharynx is clear and moist.  Eyes: Pupils are equal, round, and reactive to light.  Neck: Neck supple. No JVD present. No tracheal deviation present. No thyromegaly present.  Cardiovascular: Normal rate, regular rhythm, normal heart sounds and intact distal pulses.   Respiratory: Effort normal  and breath sounds normal. No stridor. No respiratory distress. She has no wheezes.  GI: Soft. There is no tenderness. There is no guarding.  Musculoskeletal:       Right hip: She exhibits decreased range of motion, decreased strength, tenderness, bony tenderness and swelling. She exhibits no deformity and no laceration.  Lymphadenopathy:    She has no cervical adenopathy.  Neurological: She is alert and oriented to person, place, and time.  Skin: Skin is warm and dry.  Psychiatric: She has a normal mood and affect.     Labs:  Estimated body mass index is 42.89 kg/(m^2) as calculated from the following:   Height as of 01/01/13: 5\' 4"  (1.626 m).   Weight as of 01/26/13: 113.399 kg (250 lb).   Imaging Review Plain radiographs demonstrate severe degenerative joint disease of the right hip(s). The bone quality appears to be good for age and reported activity level.  Assessment/Plan:  End stage arthritis, right hip(s)  The patient history, physical examination, clinical judgement of the provider and imaging studies are consistent with end stage degenerative joint disease of the right hip(s) and total hip arthroplasty is deemed medically necessary. The treatment options including medical management, injection therapy, arthroscopy and arthroplasty were discussed at length. The risks and benefits of total hip arthroplasty were presented and reviewed. The risks due to aseptic loosening, infection, stiffness, dislocation/subluxation,  thromboembolic complications and other imponderables were discussed.  The patient acknowledged the explanation, agreed to proceed with the plan and consent was signed. Patient is being admitted for inpatient treatment for surgery, pain control, PT, OT, prophylactic antibiotics, VTE prophylaxis, progressive ambulation and ADL's and discharge planning.The patient is planning to be discharged home with home health services.    Anastasio Auerbach Nevah Dalal   PAC  07/18/2013, 3:21 PM

## 2013-07-20 ENCOUNTER — Inpatient Hospital Stay (HOSPITAL_COMMUNITY)
Admission: RE | Admit: 2013-07-20 | Discharge: 2013-07-22 | DRG: 818 | Disposition: A | Discharge status: Home Health | Payer: BC Managed Care – PPO | Source: Ambulatory Visit | Attending: Orthopedic Surgery | Admitting: Orthopedic Surgery

## 2013-07-20 ENCOUNTER — Encounter (HOSPITAL_COMMUNITY): Payer: Self-pay | Admitting: *Deleted

## 2013-07-20 ENCOUNTER — Inpatient Hospital Stay (HOSPITAL_COMMUNITY): Payer: BC Managed Care – PPO

## 2013-07-20 ENCOUNTER — Inpatient Hospital Stay (HOSPITAL_COMMUNITY): Payer: BC Managed Care – PPO | Admitting: *Deleted

## 2013-07-20 ENCOUNTER — Encounter (HOSPITAL_COMMUNITY)
Admission: RE | Disposition: A | Discharge status: Home Health | Payer: Self-pay | Source: Ambulatory Visit | Attending: Orthopedic Surgery

## 2013-07-20 DIAGNOSIS — D62 Acute posthemorrhagic anemia: Secondary | ICD-10-CM | POA: Diagnosis not present

## 2013-07-20 DIAGNOSIS — I10 Essential (primary) hypertension: Secondary | ICD-10-CM | POA: Diagnosis present

## 2013-07-20 DIAGNOSIS — Z87891 Personal history of nicotine dependence: Secondary | ICD-10-CM

## 2013-07-20 DIAGNOSIS — Z6841 Body Mass Index (BMI) 40.0 and over, adult: Secondary | ICD-10-CM

## 2013-07-20 DIAGNOSIS — J4489 Other specified chronic obstructive pulmonary disease: Secondary | ICD-10-CM | POA: Diagnosis present

## 2013-07-20 DIAGNOSIS — K219 Gastro-esophageal reflux disease without esophagitis: Secondary | ICD-10-CM | POA: Diagnosis present

## 2013-07-20 DIAGNOSIS — D500 Iron deficiency anemia secondary to blood loss (chronic): Secondary | ICD-10-CM | POA: Diagnosis not present

## 2013-07-20 DIAGNOSIS — M169 Osteoarthritis of hip, unspecified: Principal | ICD-10-CM | POA: Diagnosis present

## 2013-07-20 DIAGNOSIS — M1610 Unilateral primary osteoarthritis, unspecified hip: Principal | ICD-10-CM | POA: Diagnosis present

## 2013-07-20 DIAGNOSIS — G5710 Meralgia paresthetica, unspecified lower limb: Secondary | ICD-10-CM | POA: Diagnosis present

## 2013-07-20 DIAGNOSIS — J449 Chronic obstructive pulmonary disease, unspecified: Secondary | ICD-10-CM | POA: Diagnosis present

## 2013-07-20 DIAGNOSIS — Z96649 Presence of unspecified artificial hip joint: Secondary | ICD-10-CM

## 2013-07-20 HISTORY — PX: TOTAL HIP ARTHROPLASTY: SHX124

## 2013-07-20 LAB — TYPE AND SCREEN
ABO/RH(D): O POS
Antibody Screen: NEGATIVE

## 2013-07-20 SURGERY — ARTHROPLASTY, HIP, TOTAL, ANTERIOR APPROACH
Anesthesia: Spinal | Site: Hip | Laterality: Right | Wound class: Clean

## 2013-07-20 MED ORDER — PROPOFOL INFUSION 10 MG/ML OPTIME
INTRAVENOUS | Status: DC | PRN
  Administered 2013-07-20: 50 ug/kg/min via INTRAVENOUS

## 2013-07-20 MED ORDER — HYDROMORPHONE HCL PF 1 MG/ML IJ SOLN: 0.2500 mg | INTRAMUSCULAR | Status: DC | PRN

## 2013-07-20 MED ORDER — CELECOXIB 200 MG PO CAPS
200.0000 mg | ORAL_CAPSULE | Freq: Two times a day (BID) | ORAL | Status: DC
  Administered 2013-07-20 – 2013-07-22 (×4): 200 mg via ORAL
  Filled 2013-07-20 (×5): qty 1

## 2013-07-20 MED ORDER — ONDANSETRON HCL 4 MG/2ML IJ SOLN
INTRAMUSCULAR | Status: DC | PRN
  Administered 2013-07-20: 4 mg via INTRAVENOUS

## 2013-07-20 MED ORDER — ASPIRIN EC 325 MG PO TBEC
325.0000 mg | DELAYED_RELEASE_TABLET | Freq: Two times a day (BID) | ORAL | Status: DC
  Administered 2013-07-21 – 2013-07-22 (×3): 325 mg via ORAL
  Filled 2013-07-20 (×5): qty 1

## 2013-07-20 MED ORDER — SODIUM CHLORIDE 0.9 % IV SOLN
100.0000 mL/h | INTRAVENOUS | Status: DC
  Administered 2013-07-20 – 2013-07-21 (×2): 100 mL/h via INTRAVENOUS
  Filled 2013-07-20 (×6): qty 1000

## 2013-07-20 MED ORDER — LACTATED RINGERS IV SOLN: INTRAVENOUS | Status: DC

## 2013-07-20 MED ORDER — PHENYLEPHRINE HCL 10 MG/ML IJ SOLN
10.0000 mg | INTRAVENOUS | Status: DC | PRN
  Administered 2013-07-20: 50 ug/min via INTRAVENOUS

## 2013-07-20 MED ORDER — CHLORHEXIDINE GLUCONATE 4 % EX LIQD: 60.0000 mL | Freq: Once | CUTANEOUS | Status: DC

## 2013-07-20 MED ORDER — BUPIVACAINE HCL (PF) 0.5 % IJ SOLN
INTRAMUSCULAR | Status: DC | PRN
  Administered 2013-07-20: 3 mL

## 2013-07-20 MED ORDER — PHENOL 1.4 % MT LIQD: 1.0000 | OROMUCOSAL | Status: DC | PRN

## 2013-07-20 MED ORDER — HYDROMORPHONE HCL PF 1 MG/ML IJ SOLN
0.5000 mg | INTRAMUSCULAR | Status: DC | PRN
  Administered 2013-07-20 (×2): 1 mg via INTRAVENOUS
  Filled 2013-07-20 (×2): qty 1

## 2013-07-20 MED ORDER — METOCLOPRAMIDE HCL 10 MG PO TABS: 5.0000 mg | ORAL_TABLET | Freq: Three times a day (TID) | ORAL | Status: DC | PRN

## 2013-07-20 MED ORDER — 0.9 % SODIUM CHLORIDE (POUR BTL) OPTIME
TOPICAL | Status: DC | PRN
  Administered 2013-07-20: 1000 mL

## 2013-07-20 MED ORDER — CEFAZOLIN SODIUM-DEXTROSE 2-3 GM-% IV SOLR
2.0000 g | INTRAVENOUS | Status: AC
  Administered 2013-07-20: 2 g via INTRAVENOUS

## 2013-07-20 MED ORDER — POLYETHYLENE GLYCOL 3350 17 G PO PACK
17.0000 g | PACK | Freq: Two times a day (BID) | ORAL | Status: DC
  Administered 2013-07-20 – 2013-07-21 (×3): 17 g via ORAL

## 2013-07-20 MED ORDER — DIPHENHYDRAMINE HCL 25 MG PO CAPS: 25.0000 mg | ORAL_CAPSULE | Freq: Four times a day (QID) | ORAL | Status: DC | PRN

## 2013-07-20 MED ORDER — METHOCARBAMOL 500 MG PO TABS
500.0000 mg | ORAL_TABLET | Freq: Four times a day (QID) | ORAL | Status: DC | PRN
  Filled 2013-07-20: qty 1

## 2013-07-20 MED ORDER — PROMETHAZINE HCL 25 MG/ML IJ SOLN: 6.2500 mg | INTRAMUSCULAR | Status: DC | PRN

## 2013-07-20 MED ORDER — FUROSEMIDE 20 MG PO TABS
20.0000 mg | ORAL_TABLET | Freq: Every morning | ORAL | Status: DC
  Administered 2013-07-21 – 2013-07-22 (×2): 20 mg via ORAL
  Filled 2013-07-20 (×2): qty 1

## 2013-07-20 MED ORDER — STERILE WATER FOR IRRIGATION IR SOLN
Status: DC | PRN
  Administered 2013-07-20: 3000 mL

## 2013-07-20 MED ORDER — DEXAMETHASONE SODIUM PHOSPHATE 10 MG/ML IJ SOLN
INTRAMUSCULAR | Status: DC | PRN
  Administered 2013-07-20: 10 mg via INTRAVENOUS

## 2013-07-20 MED ORDER — ALUM & MAG HYDROXIDE-SIMETH 200-200-20 MG/5ML PO SUSP: 30.0000 mL | ORAL | Status: DC | PRN

## 2013-07-20 MED ORDER — CARVEDILOL 25 MG PO TABS
25.0000 mg | ORAL_TABLET | Freq: Two times a day (BID) | ORAL | Status: DC
  Administered 2013-07-20 – 2013-07-22 (×4): 25 mg via ORAL
  Filled 2013-07-20 (×6): qty 1

## 2013-07-20 MED ORDER — KETAMINE HCL 10 MG/ML IJ SOLN
INTRAMUSCULAR | Status: DC | PRN
  Administered 2013-07-20 (×2): 25 mg via INTRAVENOUS

## 2013-07-20 MED ORDER — FERROUS SULFATE 325 (65 FE) MG PO TABS
325.0000 mg | ORAL_TABLET | Freq: Three times a day (TID) | ORAL | Status: DC
  Administered 2013-07-20 – 2013-07-22 (×5): 325 mg via ORAL
  Filled 2013-07-20 (×8): qty 1

## 2013-07-20 MED ORDER — DEXAMETHASONE SODIUM PHOSPHATE 10 MG/ML IJ SOLN
10.0000 mg | Freq: Once | INTRAMUSCULAR | Status: DC
  Filled 2013-07-20: qty 1

## 2013-07-20 MED ORDER — MENTHOL 3 MG MT LOZG: 1.0000 | LOZENGE | OROMUCOSAL | Status: DC | PRN

## 2013-07-20 MED ORDER — LACTATED RINGERS IV SOLN
INTRAVENOUS | Status: DC
  Administered 2013-07-20: 13:00:00 via INTRAVENOUS
  Administered 2013-07-20: 1000 mL via INTRAVENOUS
  Administered 2013-07-20: 12:00:00 via INTRAVENOUS

## 2013-07-20 MED ORDER — DOCUSATE SODIUM 100 MG PO CAPS
100.0000 mg | ORAL_CAPSULE | Freq: Two times a day (BID) | ORAL | Status: DC
  Administered 2013-07-20 – 2013-07-22 (×4): 100 mg via ORAL

## 2013-07-20 MED ORDER — TRANEXAMIC ACID 100 MG/ML IV SOLN
1000.0000 mg | Freq: Once | INTRAVENOUS | Status: AC
  Administered 2013-07-20: 1000 mg via INTRAVENOUS
  Filled 2013-07-20: qty 10

## 2013-07-20 MED ORDER — MIDAZOLAM HCL 5 MG/5ML IJ SOLN
INTRAMUSCULAR | Status: DC | PRN
  Administered 2013-07-20: 2 mg via INTRAVENOUS

## 2013-07-20 MED ORDER — EPHEDRINE SULFATE 50 MG/ML IJ SOLN
INTRAMUSCULAR | Status: DC | PRN
  Administered 2013-07-20: 5 mg via INTRAVENOUS
  Administered 2013-07-20: 10 mg via INTRAVENOUS

## 2013-07-20 MED ORDER — METHOCARBAMOL 100 MG/ML IJ SOLN
500.0000 mg | Freq: Four times a day (QID) | INTRAVENOUS | Status: DC | PRN
  Administered 2013-07-20: 500 mg via INTRAVENOUS
  Filled 2013-07-20 (×2): qty 5

## 2013-07-20 MED ORDER — FLEET ENEMA 7-19 GM/118ML RE ENEM: 1.0000 | ENEMA | Freq: Once | RECTAL | Status: AC | PRN

## 2013-07-20 MED ORDER — CEFAZOLIN SODIUM-DEXTROSE 2-3 GM-% IV SOLR
2.0000 g | Freq: Four times a day (QID) | INTRAVENOUS | Status: AC
  Administered 2013-07-20 (×2): 2 g via INTRAVENOUS
  Filled 2013-07-20 (×2): qty 50

## 2013-07-20 MED ORDER — MEPERIDINE HCL 50 MG/ML IJ SOLN: 6.2500 mg | INTRAMUSCULAR | Status: DC | PRN

## 2013-07-20 MED ORDER — PROPOFOL 10 MG/ML IV BOLUS
INTRAVENOUS | Status: DC | PRN
  Administered 2013-07-20: 80 mg via INTRAVENOUS
  Administered 2013-07-20: 50 mg via INTRAVENOUS

## 2013-07-20 MED ORDER — FENTANYL CITRATE 0.05 MG/ML IJ SOLN
INTRAMUSCULAR | Status: DC | PRN
  Administered 2013-07-20 (×2): 25 ug via INTRAVENOUS

## 2013-07-20 MED ORDER — ZOLPIDEM TARTRATE 5 MG PO TABS: 5.0000 mg | ORAL_TABLET | Freq: Every evening | ORAL | Status: DC | PRN

## 2013-07-20 MED ORDER — HYDROCODONE-ACETAMINOPHEN 7.5-325 MG PO TABS
1.0000 | ORAL_TABLET | ORAL | Status: DC
  Administered 2013-07-20 – 2013-07-21 (×8): 2 via ORAL
  Administered 2013-07-22 (×3): 1 via ORAL
  Filled 2013-07-20 (×7): qty 2
  Filled 2013-07-20 (×2): qty 1
  Filled 2013-07-20 (×2): qty 2

## 2013-07-20 MED ORDER — METOCLOPRAMIDE HCL 5 MG/ML IJ SOLN
INTRAMUSCULAR | Status: DC | PRN
  Administered 2013-07-20: 10 mg via INTRAVENOUS

## 2013-07-20 MED ORDER — ONDANSETRON HCL 4 MG/2ML IJ SOLN: 4.0000 mg | Freq: Four times a day (QID) | INTRAMUSCULAR | Status: DC | PRN

## 2013-07-20 MED ORDER — BISACODYL 10 MG RE SUPP: 10.0000 mg | Freq: Every day | RECTAL | Status: DC | PRN

## 2013-07-20 MED ORDER — METOCLOPRAMIDE HCL 5 MG/ML IJ SOLN: 5.0000 mg | Freq: Three times a day (TID) | INTRAMUSCULAR | Status: DC | PRN

## 2013-07-20 MED ORDER — SUCCINYLCHOLINE CHLORIDE 20 MG/ML IJ SOLN
INTRAMUSCULAR | Status: DC | PRN
  Administered 2013-07-20: 100 mg via INTRAVENOUS

## 2013-07-20 MED ORDER — DEXAMETHASONE SODIUM PHOSPHATE 10 MG/ML IJ SOLN: 10.0000 mg | Freq: Once | INTRAMUSCULAR | Status: DC

## 2013-07-20 MED ORDER — PHENYLEPHRINE HCL 10 MG/ML IJ SOLN
INTRAMUSCULAR | Status: DC | PRN
  Administered 2013-07-20: 80 ug via INTRAVENOUS
  Administered 2013-07-20: 120 ug via INTRAVENOUS

## 2013-07-20 MED ORDER — ONDANSETRON HCL 4 MG PO TABS
4.0000 mg | ORAL_TABLET | Freq: Four times a day (QID) | ORAL | Status: DC | PRN
  Administered 2013-07-21 – 2013-07-22 (×2): 4 mg via ORAL
  Filled 2013-07-20 (×2): qty 1

## 2013-07-20 SURGICAL SUPPLY — 40 items
BAG ZIPLOCK 12X15 (MISCELLANEOUS) ×4 IMPLANT
BLADE SAW SGTL 18X1.27X75 (BLADE) ×2 IMPLANT
CAPT HIP PF COP ×2 IMPLANT
CELLS DAT CNTRL 66122 CELL SVR (MISCELLANEOUS) ×1 IMPLANT
CLOTH BEACON ORANGE TIMEOUT ST (SAFETY) ×2 IMPLANT
DERMABOND ADVANCED (GAUZE/BANDAGES/DRESSINGS) ×1
DERMABOND ADVANCED .7 DNX12 (GAUZE/BANDAGES/DRESSINGS) ×1 IMPLANT
DRAPE C-ARM 42X120 X-RAY (DRAPES) ×2 IMPLANT
DRAPE STERI IOBAN 125X83 (DRAPES) ×2 IMPLANT
DRAPE U-SHAPE 47X51 STRL (DRAPES) ×6 IMPLANT
DRSG AQUACEL AG ADV 3.5X10 (GAUZE/BANDAGES/DRESSINGS) ×2 IMPLANT
DRSG TEGADERM 4X4.75 (GAUZE/BANDAGES/DRESSINGS) IMPLANT
DURAPREP 26ML APPLICATOR (WOUND CARE) ×2 IMPLANT
ELECT BLADE TIP CTD 4 INCH (ELECTRODE) ×2 IMPLANT
ELECT REM PT RETURN 9FT ADLT (ELECTROSURGICAL) ×2
ELECTRODE REM PT RTRN 9FT ADLT (ELECTROSURGICAL) ×1 IMPLANT
EVACUATOR 1/8 PVC DRAIN (DRAIN) IMPLANT
FACESHIELD LNG OPTICON STERILE (SAFETY) ×8 IMPLANT
GAUZE SPONGE 2X2 8PLY STRL LF (GAUZE/BANDAGES/DRESSINGS) IMPLANT
GLOVE BIOGEL PI IND STRL 7.5 (GLOVE) ×1 IMPLANT
GLOVE BIOGEL PI IND STRL 8 (GLOVE) ×1 IMPLANT
GLOVE BIOGEL PI INDICATOR 7.5 (GLOVE) ×1
GLOVE BIOGEL PI INDICATOR 8 (GLOVE) ×1
GLOVE ECLIPSE 8.0 STRL XLNG CF (GLOVE) ×2 IMPLANT
GLOVE ORTHO TXT STRL SZ7.5 (GLOVE) ×4 IMPLANT
GOWN BRE IMP PREV XXLGXLNG (GOWN DISPOSABLE) ×2 IMPLANT
GOWN STRL NON-REIN LRG LVL3 (GOWN DISPOSABLE) ×2 IMPLANT
KIT BASIN OR (CUSTOM PROCEDURE TRAY) ×2 IMPLANT
PACK TOTAL JOINT (CUSTOM PROCEDURE TRAY) ×2 IMPLANT
PADDING CAST COTTON 6X4 STRL (CAST SUPPLIES) ×2 IMPLANT
RTRCTR WOUND ALEXIS 18CM MED (MISCELLANEOUS) ×2
SPONGE GAUZE 2X2 STER 10/PKG (GAUZE/BANDAGES/DRESSINGS)
SUCTION FRAZIER 12FR DISP (SUCTIONS) ×2 IMPLANT
SUT MNCRL AB 4-0 PS2 18 (SUTURE) ×2 IMPLANT
SUT VIC AB 1 CT1 36 (SUTURE) ×4 IMPLANT
SUT VIC AB 2-0 CT1 27 (SUTURE) ×3
SUT VIC AB 2-0 CT1 TAPERPNT 27 (SUTURE) ×3 IMPLANT
SUT VLOC 180 0 24IN GS25 (SUTURE) ×2 IMPLANT
TOWEL OR 17X26 10 PK STRL BLUE (TOWEL DISPOSABLE) ×4 IMPLANT
TRAY FOLEY CATH 14FRSI W/METER (CATHETERS) ×2 IMPLANT

## 2013-07-20 NOTE — Progress Notes (Signed)
X-ray results noted 

## 2013-07-20 NOTE — Op Note (Signed)
NAME:  Shelby LAAKSO.: [id number]      MEDICAL RECORD NO.: [id number]      FACILITY:  [facility]      PHYSICIAN:  Durene Romans D  DATE OF BIRTH:  [birth year]     DATE OF PROCEDURE:  07/20/2013                                 OPERATIVE REPORT         PREOPERATIVE DIAGNOSIS: Right  hip osteoarthritis.      POSTOPERATIVE DIAGNOSIS:  Right hip osteoarthritis.      PROCEDURE:  Right total hip replacement through an anterior approach   utilizing DePuy THR system, component size 52mm pinnacle cup, a size 36+4 neutral   Altrex liner, a size 0 Hi Tri Lock stem with a 36+5 delta ceramic   ball.      SURGEON:  Madlyn Frankel. Charlann Boxer, M.D.      ASSISTANT:  Leilani Able, PA-C     ANESTHESIA:  General and Spinal.      SPECIMENS:  None.      COMPLICATIONS:  None.      BLOOD LOSS:  350 cc     DRAINS:  One Hemovac.      INDICATION OF THE PROCEDURE:  [name] is a 57 y.o. female who had   presented to office for evaluation of right hip pain.  Radiographs revealed   progressive degenerative changes with bone-on-bone   articulation to the  hip joint.  The patient had painful limited range of   motion significantly affecting their overall quality of life.  The patient was failing to    respond to conservative measures, and at this point was ready   to proceed with more definitive measures.  The patient has noted progressive   degenerative changes in his hip, progressive problems and dysfunction   with regarding the hip prior to surgery.  Consent was obtained for   benefit of pain relief.  Specific risk of infection, DVT, component   failure, dislocation, need for revision surgery, as well discussion of   the anterior versus posterior approach were reviewed.  Consent was   obtained for benefit of anterior pain relief through an anterior   approach.      PROCEDURE IN DETAIL:  The patient was brought to operative theater.   Once adequate  anesthesia, preoperative antibiotics, 2gm Anecf administered.   The patient was positioned supine on the OSI Hanna table.  Once adequate   padding of boney process was carried out, we had predraped out the hip, and  used fluoroscopy to confirm orientation of the pelvis and position.      The right hip was then prepped and draped from proximal iliac crest to   mid thigh with shower curtain technique.      Time-out was performed identifying the patient, planned procedure, and   extremity.     An incision was then made 2 cm distal and lateral to the   anterior superior iliac spine extending over the orientation of the   tensor fascia lata muscle and sharp dissection was carried down to the   fascia of the muscle and protractor placed in the soft tissues.      The fascia was then incised.  The muscle belly was identified  and swept   laterally and retractor placed along the superior neck.  Following   cauterization of the circumflex vessels and removing some pericapsular   fat, a second cobra retractor was placed on the inferior neck.  A third   retractor was placed on the anterior acetabulum after elevating the   anterior rectus.  A L-capsulotomy was along the line of the   superior neck to the trochanteric fossa, then extended proximally and   distally.  Tag sutures were placed and the retractors were then placed   intracapsular.  We then identified the trochanteric fossa and   orientation of my neck cut, confirmed this radiographically   and then made a neck osteotomy with the femur on traction.  The femoral   head was removed without difficulty or complication.  Traction was let   off and retractors were placed posterior and anterior around the   acetabulum.      The labrum and foveal tissue were debrided as well as significant osteophytes anteriomedially.  I began reaming with a 47mm   reamer and reamed up to 51mm reamer with good bony bed preparation and a 52   cup was chosen.  The  final 52mm Pinnacle cup was then impacted under fluoroscopy  to confirm the depth of penetration and orientation with respect to   abduction.  A screw was placed followed by the hole eliminator.  The final   36+4 neutral Altrex liner was impacted with good visualized rim fit.  The cup was positioned anatomically within the acetabular portion of the pelvis.      At this point, the femur was rolled at 80 degrees.  Further capsule was   released off the inferior aspect of the femoral neck.  I then   released the superior capsule proximally.  The hook was placed laterally   along the femur and elevated manually and held in position with the bed   hook.  The leg was then extended and adducted with the leg rolled to 100   degrees of external rotation.  Once the proximal femur was fully   exposed, I used a box osteotome to set orientation.  I then began   broaching with the starting chili pepper broach and passed this by hand and then broached up to just the 0.  With the 0 broach in place I chose a high offset neck and did a trial reduction first with a 36+1.5 then +5 ball.  The offset was appropriate, leg lengths   appeared to be equal, confirmed radiographically.   Given these findings, I went ahead and dislocated the hip, repositioned all   retractors and positioned the right hip in the extended and abducted position.  The final 0 Hi Tri Lock stem was   chosen and it was impacted down to the level of neck cut.  Based on this   and the trial reduction, a 36+5 delta ceramic ball was chosen and   impacted onto a clean and dry trunnion, and the hip was reduced.  The   hip had been irrigated throughout the case again at this point.  I did   reapproximate the superior capsular leaflet to the anterior leaflet   using #1 Vicryl, placed a medium Hemovac drain deep.  The fascia of the   tensor fascia lata muscle was then reapproximated using #1 Vicryl.  The   remaining wound was closed with 2-0 Vicryl and  running 4-0 Monocryl.   The hip was cleaned,  dried, and dressed sterilely using Dermabond and   Aquacel dressing.  Drain site dressed separately.  She was then brought   to recovery room in stable condition tolerating the procedure well.    Leilani Able, PA-C was present for the entirety of the case involved from   preoperative positioning, perioperative retractor management, general   facilitation of the case, as well as primary wound closure as assistant.            Madlyn Frankel Charlann Boxer, M.D.            MDO/MEDQ  D:  10/01/2011  T:  10/01/2011  Job:  [id number]      Electronically Signed by Durene Romans M.D. on 10/07/2011 09:15:38 AM

## 2013-07-20 NOTE — Anesthesia Procedure Notes (Signed)
Spinal  Patient location during procedure: OR Staffing Anesthesiologist: Kellianne Ek Performed by: anesthesiologist  Preanesthetic Checklist Completed: patient identified, site marked, surgical consent, pre-op evaluation, timeout performed, IV checked, risks and benefits discussed and monitors and equipment checked Spinal Block Patient position: sitting Prep: Betadine Patient monitoring: heart rate, continuous pulse ox and blood pressure Approach: right paramedian Location: L2-3 Injection technique: single-shot Needle Needle type: Spinocan  Needle gauge: 22 G Needle length: 9 cm Additional Notes Expiration date of kit checked and confirmed. Patient tolerated procedure well, without complications.     

## 2013-07-20 NOTE — Preoperative (Signed)
Beta Blockers   Reason not to administer Beta Blockers:Not Applicable, took BB this am 

## 2013-07-20 NOTE — Anesthesia Preprocedure Evaluation (Addendum)
Anesthesia Evaluation  Patient identified by MRN, date of birth, ID band Patient awake    Reviewed: Allergy & Precautions, H&P , NPO status , Patient's Chart, lab work & pertinent test results  Airway Mallampati: II TM Distance: >3 FB Neck ROM: Full    Dental no notable dental hx.    Pulmonary neg pulmonary ROS, asthma , former smoker,  breath sounds clear to auscultation  Pulmonary exam normal       Cardiovascular hypertension, Pt. on medications negative cardio ROS  Rhythm:Regular Rate:Normal     Neuro/Psych negative neurological ROS  negative psych ROS   GI/Hepatic negative GI ROS, Neg liver ROS,   Endo/Other  negative endocrine ROSMorbid obesity  Renal/GU negative Renal ROS  negative genitourinary   Musculoskeletal negative musculoskeletal ROS (+)   Abdominal   Peds negative pediatric ROS (+)  Hematology negative hematology ROS (+)   Anesthesia Other Findings   Reproductive/Obstetrics negative OB ROS                          Anesthesia Physical Anesthesia Plan  ASA: III  Anesthesia Plan: Spinal   Post-op Pain Management:    Induction:   Airway Management Planned: Simple Face Mask  Additional Equipment:   Intra-op Plan:   Post-operative Plan:   Informed Consent: I have reviewed the patients History and Physical, chart, labs and discussed the procedure including the risks, benefits and alternatives for the proposed anesthesia with the patient or authorized representative who has indicated his/her understanding and acceptance.   Dental advisory given  Plan Discussed with: CRNA  Anesthesia Plan Comments:         Anesthesia Quick Evaluation

## 2013-07-20 NOTE — Transfer of Care (Signed)
Immediate Anesthesia Transfer of Care Note  Patient: [name]  Procedure(s) Performed: Procedure(s): RIGHT TOTAL HIP ARTHROPLASTY ANTERIOR APPROACH (Right)  Patient Location: PACU  Anesthesia Type: spinal converted to general  Level of Consciousness: awake, alert , oriented, patient cooperative and responds to stimulation  Airway & Oxygen Therapy: Patient Spontanous Breathing and Patient connected to face mask oxygen  Post-op Assessment: Report given to PACU RN and Post -op Vital signs reviewed and stable  Post vital signs: Reviewed and stable  Complications: No apparent anesthesia complications

## 2013-07-20 NOTE — Interval H&P Note (Signed)
History and Physical Interval Note:  07/20/2013 8:57 AM  [name]  has presented today for surgery, with the diagnosis of RIGHT HIP OSTEOARTHRITIS  The various methods of treatment have been discussed with the patient and family. After consideration of risks, benefits and other options for treatment, the patient has consented to  Procedure(s): RIGHT TOTAL HIP ARTHROPLASTY ANTERIOR APPROACH (Right) as a surgical intervention .  The patient's history has been reviewed, patient examined, no change in status, stable for surgery.  I have reviewed the patient's chart and labs.  Questions were answered to the patient's satisfaction.     [name]

## 2013-07-20 NOTE — Progress Notes (Signed)
Portable AP Pelvis and Lateral Right Hip X-rays done. 

## 2013-07-20 NOTE — Anesthesia Postprocedure Evaluation (Signed)
  Anesthesia Post-op Note  Patient: [name]  Procedure(s) Performed: Procedure(s) (LRB): RIGHT TOTAL HIP ARTHROPLASTY ANTERIOR APPROACH (Right)  Patient Location: PACU  Anesthesia Type: General and Spinal  Level of Consciousness: awake and alert   Airway and Oxygen Therapy: Patient Spontanous Breathing  Post-op Pain: mild  Post-op Assessment: Post-op Vital signs reviewed, Patient's Cardiovascular Status Stable, Respiratory Function Stable, Patent Airway and No signs of Nausea or vomiting  Last Vitals:  Filed Vitals:   07/20/13 1709  BP: 147/76  Pulse: 74  Temp: 37.2 C  Resp: 16    Post-op Vital Signs: stable   Complications: No apparent anesthesia complications

## 2013-07-21 ENCOUNTER — Encounter (HOSPITAL_COMMUNITY): Payer: Self-pay | Admitting: Orthopedic Surgery

## 2013-07-21 DIAGNOSIS — D500 Iron deficiency anemia secondary to blood loss (chronic): Secondary | ICD-10-CM | POA: Diagnosis not present

## 2013-07-21 LAB — BASIC METABOLIC PANEL
Chloride: 104 mEq/L (ref 96–112)
Creatinine, Ser: 0.98 mg/dL (ref 0.50–1.10)
GFR calc Af Amer: 72 mL/min — ABNORMAL LOW (ref >90)

## 2013-07-21 LAB — CBC
MCV: 91.1 fL (ref 78.0–100.0)
Platelets: 209 10*3/uL (ref 150–400)
RDW: 13.6 % (ref 11.5–15.5)
WBC: 14.6 10*3/uL — ABNORMAL HIGH (ref 4.0–10.5)

## 2013-07-21 NOTE — Progress Notes (Signed)
   Subjective: 1 Day Post-Op Procedure(s) (LRB): RIGHT TOTAL HIP ARTHROPLASTY ANTERIOR APPROACH (Right)   Patient reports pain as mild, pain well controlled. No events throughout the night.  Objective:   VITALS:   Filed Vitals:   07/21/13 0500  BP: 130/60  Pulse: 66  Temp: 98.3 F (36.8 C)  Resp: 18    Neurovascular intact Dorsiflexion/Plantar flexion intact Incision: dressing C/D/I No cellulitis present Compartment soft  LABS  Recent Labs  07/21/13 0415  HGB 10.2*  HCT 31.9*  WBC 14.6*  PLT 209     Recent Labs  07/21/13 0415  NA 138  K 4.6  BUN 15  CREATININE 0.98  GLUCOSE 147*     Assessment/Plan: 1 Day Post-Op Procedure(s) (LRB): RIGHT TOTAL HIP ARTHROPLASTY ANTERIOR APPROACH (Right) HV drain d/c'ed Foley cath d/c'ed Advance diet Up with therapy D/C IV fluids Discharge home with home health, eventually when ready  Expected ABLA  Treated with iron and will observe  Morbid Obesity (BMI >40)  Estimated body mass index is 42.38 kg/(m^2) as calculated from the following:   Height as of this encounter: 5\' 4"  (1.626 m).   Weight as of this encounter: 112.038 kg (247 lb). Patient also counseled that weight may inhibit the healing process Patient counseled that losing weight will help with future health issues      Anastasio Auerbach. Beanca Kiester   PAC  07/21/2013, 2:22 PM

## 2013-07-21 NOTE — Progress Notes (Signed)
[facility]   [facility] is providing the following services: RW and Commode  If patient discharges after hours, please call [phone].   Renard Hamper 07/21/2013, 8:52 AM

## 2013-07-21 NOTE — Progress Notes (Signed)
Physical Therapy Treatment Patient Details Name: [name] MRN: [id number] DOB: [birth year] Today's Date: 07/21/2013 Time: 2956-2130 PT Time Calculation (min): 27 min  PT Assessment / Plan / Recommendation  History of Present Illness     PT Comments     Follow Up Recommendations  Home health PT     Does the patient have the potential to tolerate intense rehabilitation     Barriers to Discharge        Equipment Recommendations  Rolling walker with 5" wheels    Recommendations for Other Services OT consult  Frequency 7X/week   Progress towards PT Goals Progress towards PT goals: Progressing toward goals  Plan Current plan remains appropriate    Precautions / Restrictions Precautions Precautions: Fall Restrictions Weight Bearing Restrictions: No   Pertinent Vitals/Pain 4/10; premed, ice packs provided    Mobility  Bed Mobility Bed Mobility: Sit to Supine Sit to Supine: 4: Min assist Details for Bed Mobility Assistance: cues for sequence and use of L LE to self assist Transfers Transfers: Sit to Stand;Stand to Sit Sit to Stand: 4: Min assist Stand to Sit: 4: Min guard Details for Transfer Assistance: cues for LE management and use of UEs to self assist Ambulation/Gait Ambulation/Gait Assistance: 4: Min assist Ambulation Distance (Feet): 200 Feet Assistive device: Rolling walker Ambulation/Gait Assistance Details: cues for initial sequence, posture and position from RW Gait Pattern: Step-to pattern;Step-through pattern Stairs: Yes Stairs Assistance: 4: Min assist Stairs Assistance Details (indicate cue type and reason): cues for sequence and foot placement Stair Management Technique: Two rails;Forwards;Step to pattern Number of Stairs: 4    Exercises     PT Diagnosis:    PT Problem List:   PT Treatment Interventions:     PT Goals (current goals can now be found in the care plan section) Acute Rehab PT Goals Patient Stated Goal: Resume previous lifestyle  with decreased pain PT Goal Formulation: With patient Potential to Achieve Goals: Good  Visit Information  Last PT Received On: 07/21/13 Assistance Needed: +1    Subjective Data  Patient Stated Goal: Resume previous lifestyle with decreased pain   Cognition  Cognition Arousal/Alertness: Awake/alert Behavior During Therapy: WFL for tasks assessed/performed Overall Cognitive Status: Within Functional Limits for tasks assessed    Balance     End of Session PT - End of Session Equipment Utilized During Treatment: Gait belt Activity Tolerance: Patient tolerated treatment well Patient left: in bed;with call bell/phone within reach Nurse Communication: Mobility status   GP     [name] 07/21/2013, 5:30 PM

## 2013-07-21 NOTE — Progress Notes (Signed)
Occupational Therapy Treatment Patient Details Name: [name] MRN: [id number] DOB: July 05, 1956 Today's Date: 07/21/2013 Time: 0272-5366 OT Time Calculation (min): 26 min  OT Assessment / Plan / Recommendation  History of present illness R THR, aa   OT comments  Pt is moving well POD 1.  Educated in multiple uses of 3 in1 and in AE for LB ADL.  Pt will rely on daughter to assist with ADL.  Needs to practice shower transfer onto 3 in 1.    Follow Up Recommendations  No OT follow up;Supervision/Assistance - 24 hour    Barriers to Discharge       Equipment Recommendations  3 in 1 bedside comode    Recommendations for Other Services    Frequency Min 2X/week   Progress towards OT Goals    Plan      Precautions / Restrictions Precautions Precautions: Fall Restrictions Weight Bearing Restrictions: No   Pertinent Vitals/Pain Denies pain, premedicated, iced    ADL  Eating/Feeding: Independent Where Assessed - Eating/Feeding: Chair Grooming: Wash/dry hands;Teeth care;Supervision/safety Where Assessed - Grooming: Unsupported standing Upper Body Bathing: Set up Where Assessed - Upper Body Bathing: Unsupported sitting Lower Body Bathing: Minimal assistance Where Assessed - Lower Body Bathing: Unsupported sitting;Supported sit to stand Upper Body Dressing: Set up Where Assessed - Upper Body Dressing: Unsupported sitting Lower Body Dressing: Minimal assistance Where Assessed - Lower Body Dressing: Unsupported sitting;Supported sit to stand Toilet Transfer: Min Pension scheme manager Method: Sit to stand Equipment Used: Reacher;Long-handled sponge;Long-handled shoe horn;Rolling walker;Sock aid Transfers/Ambulation Related to ADLs: min to min guard assist with RW ADL Comments: Will rely on daughter, who is a CNA, to assist her with LB ADL until she is able.  Demonstrated AE.    OT Diagnosis: Generalized weakness;Acute pain  OT Problem List: Decreased strength;Impaired balance  (sitting and/or standing);Decreased knowledge of use of DME or AE;Obesity;Pain OT Treatment Interventions: Self-care/ADL training;DME and/or AE instruction   OT Goals(current goals can now be found in the care plan section) Acute Rehab OT Goals Patient Stated Goal: Resume previous lifestyle with decreased pain OT Goal Formulation: With patient Time For Goal Achievement: 07/28/13 Potential to Achieve Goals: Good ADL Goals Pt Will Perform Tub/Shower Transfer: with supervision;ambulating;3 in 1;rolling walker  Visit Information  Last OT Received On: 07/21/13 Assistance Needed: +1 History of Present Illness: R THR, aa    Subjective Data      Prior Functioning  Home Living Family/patient expects to be discharged to:: Private residence Living Arrangements: Children;Parent Available Help at Discharge: Family Type of Home: House Home Access: Stairs to enter Secretary/administrator of Steps: 4 Entrance Stairs-Rails: Right Home Layout: Two level Alternate Level Stairs-Number of Steps: 14 Alternate Level Stairs-Rails: Right Home Equipment: None Prior Function Level of Independence: Independent Comments: 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 Communication Communication: No difficulties Dominant Hand: Right    Cognition  Cognition Arousal/Alertness: Awake/alert Behavior During Therapy: WFL for tasks assessed/performed Overall Cognitive Status: Within Functional Limits for tasks assessed    Mobility  Bed Mobility Bed Mobility: Not assessed  Transfers Sit to Stand: 4: Min assist Stand to Sit: 4: Min assist Details for Transfer Assistance: cues for LE management and use of UEs to self assist    Exercises     Balance     End of Session OT - End of Session Activity Tolerance: Patient tolerated treatment well Patient left: in chair;with call bell/phone within reach  GO     [name]  [name] 07/21/2013, 12:16 PM [phone]

## 2013-07-21 NOTE — Evaluation (Signed)
Physical Therapy Evaluation Patient Details Name: [name] MRN: [id number] DOB: 1956/05/03 Today's Date: 07/21/2013 Time: 4098-1191 PT Time Calculation (min): 32 min  PT Assessment / Plan / Recommendation History of Present Illness     Clinical Impression  Pt s/p R THR presents with decreased R LE stength/ROM and postop pain limiting functional mobility    PT Assessment  Patient needs continued PT services    Follow Up Recommendations  Home health PT    Does the patient have the potential to tolerate intense rehabilitation      Barriers to Discharge        Equipment Recommendations  Rolling walker with 5" wheels    Recommendations for Other Services OT consult   Frequency 7X/week    Precautions / Restrictions Precautions Precautions: Fall Restrictions Weight Bearing Restrictions: No   Pertinent Vitals/Pain 4/10; premed, ice packs provided      Mobility  Bed Mobility Bed Mobility: Supine to Sit Supine to Sit: 4: Min assist Details for Bed Mobility Assistance: cues for sequence and use of L LE to self assist Transfers Transfers: Sit to Stand;Stand to Sit Sit to Stand: 4: Min assist Stand to Sit: 4: Min assist Details for Transfer Assistance: cues for LE management and use of UEs to self assist Ambulation/Gait Ambulation/Gait Assistance: 4: Min assist Ambulation Distance (Feet): 71 Feet Assistive device: Rolling walker Ambulation/Gait Assistance Details: cues for sequence, posture and position from RW Gait Pattern: Step-to pattern;Trunk flexed;Narrow base of support Stairs: No    Exercises Total Joint Exercises Ankle Circles/Pumps: AROM;15 reps;Both;Supine Quad Sets: AROM;Both;10 reps;Supine Heel Slides: AAROM;15 reps;Right;Supine Hip ABduction/ADduction: AAROM;15 reps;Right;Supine   PT Diagnosis: Difficulty walking  PT Problem List: Decreased strength;Decreased range of motion;Decreased activity tolerance;Decreased mobility;Pain;Decreased knowledge  of use of DME PT Treatment Interventions: DME instruction;Gait training;Stair training;Functional mobility training;Therapeutic activities;Therapeutic exercise;Patient/family education     PT Goals(Current goals can be found in the care plan section) Acute Rehab PT Goals Patient Stated Goal: Resume previous lifestyle with decreased pain PT Goal Formulation: With patient Potential to Achieve Goals: Good  Visit Information  Last PT Received On: 07/21/13 Assistance Needed: +1       Prior Functioning  Home Living Family/patient expects to be discharged to:: Private residence Living Arrangements: Children;Parent Available Help at Discharge: Family Type of Home: House Home Access: Stairs to enter Secretary/administrator of Steps: 4 Entrance Stairs-Rails: Right Home Layout: Two level Alternate Level Stairs-Number of Steps: 14 Alternate Level Stairs-Rails: Right Home Equipment: None Prior Function Level of Independence: Independent Communication Communication: No difficulties    Cognition  Cognition Arousal/Alertness: Awake/alert Behavior During Therapy: WFL for tasks assessed/performed Overall Cognitive Status: Within Functional Limits for tasks assessed    Extremity/Trunk Assessment Upper Extremity Assessment Upper Extremity Assessment: Overall WFL for tasks assessed Lower Extremity Assessment Lower Extremity Assessment: RLE deficits/detail RLE Deficits / Details: Hip strength 2+/5 with AAROM at hip to 80 flex and 20 abd   Balance    End of Session PT - End of Session Equipment Utilized During Treatment: Gait belt Activity Tolerance: Patient tolerated treatment well Patient left: in chair;with call bell/phone within reach;with family/visitor present Nurse Communication: Mobility status  GP     [name] 07/21/2013, 12:00 PM

## 2013-07-22 LAB — BASIC METABOLIC PANEL
BUN: 16 mg/dL (ref 6–23)
Chloride: 105 mEq/L (ref 96–112)
GFR calc Af Amer: 68 mL/min — ABNORMAL LOW (ref >90)
Potassium: 4.1 mEq/L (ref 3.5–5.1)

## 2013-07-22 LAB — CBC
HCT: 29.4 % — ABNORMAL LOW (ref 36.0–46.0)
Platelets: 173 10*3/uL (ref 150–400)
RDW: 13.6 % (ref 11.5–15.5)
WBC: 11.4 10*3/uL — ABNORMAL HIGH (ref 4.0–10.5)

## 2013-07-22 MED ORDER — DSS 100 MG PO CAPS: 100.0000 mg | ORAL_CAPSULE | Freq: Two times a day (BID) | ORAL | Status: DC

## 2013-07-22 MED ORDER — POLYETHYLENE GLYCOL 3350 17 G PO PACK: 17.0000 g | PACK | Freq: Two times a day (BID) | ORAL | Status: DC

## 2013-07-22 MED ORDER — ASPIRIN 325 MG PO TBEC: 325.0000 mg | DELAYED_RELEASE_TABLET | Freq: Two times a day (BID) | ORAL | Status: AC

## 2013-07-22 MED ORDER — METHOCARBAMOL 500 MG PO TABS: 500.0000 mg | ORAL_TABLET | Freq: Four times a day (QID) | ORAL | Status: DC | PRN

## 2013-07-22 MED ORDER — FERROUS SULFATE 325 (65 FE) MG PO TABS: 325.0000 mg | ORAL_TABLET | Freq: Three times a day (TID) | ORAL | Status: DC

## 2013-07-22 MED ORDER — HYDROCODONE-ACETAMINOPHEN 7.5-325 MG PO TABS: 1.0000 | ORAL_TABLET | ORAL | Status: DC | PRN

## 2013-07-22 NOTE — Progress Notes (Signed)
   Subjective: 2 Days Post-Op Procedure(s) (LRB): RIGHT TOTAL HIP ARTHROPLASTY ANTERIOR APPROACH (Right)   Patient reports pain as mild, pain well controlled. No events throughout the night. Ready to be discharged home.   Objective:   VITALS:   Filed Vitals:   07/22/13 0752  BP: 114/50  Pulse: 67  Temp:   Resp: 17    Neurovascular intact Dorsiflexion/Plantar flexion intact Incision: dressing C/D/I No cellulitis present Compartment soft  LABS  Recent Labs  07/21/13 0415 07/22/13 0424  HGB 10.2* 9.4*  HCT 31.9* 29.4*  WBC 14.6* 11.4*  PLT 209 173     Recent Labs  07/21/13 0415 07/22/13 0424  NA 138 139  K 4.6 4.1  BUN 15 16  CREATININE 0.98 1.03  GLUCOSE 147* 151*     Assessment/Plan: 2 Days Post-Op Procedure(s) (LRB): RIGHT TOTAL HIP ARTHROPLASTY ANTERIOR APPROACH (Right) Up with therapy Discharge home with home health Follow up in 2 weeks at [facility]. Follow up with OLIN,Trenton Passow D in 2 weeks.  Contact information:  [name] [address], [address] [place] [address] [phone]    Expected ABLA  Treated with iron and will observe   Morbid Obesity (BMI >40)  Estimated body mass index is 42.38 kg/(m^2) as calculated from the following:      Height as of this encounter: 5\' 4"  (1.626 m).      Weight as of this encounter: 112.038 kg (247 lb).  Patient also counseled that weight may inhibit the healing process  Patient counseled that losing weight will help with future health issues      Anastasio Auerbach. Namiko Pritts   PAC  07/22/2013, 10:07 AM

## 2013-07-22 NOTE — Progress Notes (Signed)
Occupational Therapy Treatment Patient Details Name: MELEANA COMMERFORD MRN: [id number] DOB: [birth year] Today's Date: 07/22/2013 Time: 1478-2956 OT Time Calculation (min): 25 min  OT Assessment / Plan / Recommendation  History of present illness R THR, aa   OT comments  Pt progressing well.  Had nausea this am  Follow Up Recommendations  No OT follow up;Supervision/Assistance - 24 hour    Barriers to Discharge       Equipment Recommendations  3 in 1 bedside comode    Recommendations for Other Services    Frequency     Progress towards OT Goals Progress towards OT goals: Goals met and updated - see care plan  Plan      Precautions / Restrictions Precautions Precautions: Fall Restrictions Weight Bearing Restrictions: No   Pertinent Vitals/Pain Pain OK; had nausea with movement:  Rn brought medicine    ADL  Grooming: Wash/dry hands;Supervision/safety Where Assessed - Grooming: Supported standing Toilet Transfer: Radiographer, therapeutic Method: Sit to Barista: Comfort height toilet Toileting - Architect and Hygiene: Supervision/safety Where Assessed - Engineer, mining and Hygiene: Sit to stand from 3-in-1 or toilet Tub/Shower Transfer: Supervision/safety Tub/Shower Transfer Method: Science writer: Walk in shower Transfers/Ambulation Related to ADLs: pt c/o nausea when moving:  Rn brought meds.  ADL Comments: Daughter will assist with adls at home as needed    OT Diagnosis:    OT Problem List:   OT Treatment Interventions:     OT Goals(current goals can now be found in the care plan section)    Visit Information  Last OT Received On: 07/22/13 Assistance Needed: +1 History of Present Illness: R THR, aa    Subjective Data      Prior Functioning       Cognition  Cognition Arousal/Alertness: Awake/alert Behavior During Therapy: WFL for tasks assessed/performed Overall  Cognitive Status: Within Functional Limits for tasks assessed    Mobility  Bed Mobility Bed Mobility: Sit to Supine Supine to Sit: With rails;6: Modified independent (Device/Increase time);HOB elevated Transfers Sit to Stand: 5: Supervision;From bed;From toilet;With upper extremity assist Details for Transfer Assistance: no cues needed    Exercises      Balance     End of Session OT - End of Session Activity Tolerance: Patient tolerated treatment well  GO     Yony Roulston 07/22/2013, 8:34 AM Marica Otter, OTR/L [phone] 07/22/2013

## 2013-07-22 NOTE — Progress Notes (Signed)
Physical Therapy Treatment Patient Details Name: [name] MRN: [id number] DOB: Jul 27, 1956 Today's Date: 07/22/2013 Time: 7829-5621 PT Time Calculation (min): 44 min  PT Assessment / Plan / Recommendation  History of Present Illness     PT Comments   Reviewed car transfers and stairs with pt.  Follow Up Recommendations  Home health PT     Does the patient have the potential to tolerate intense rehabilitation     Barriers to Discharge        Equipment Recommendations  Rolling walker with 5" wheels    Recommendations for Other Services OT consult  Frequency 7X/week   Progress towards PT Goals Progress towards PT goals: Progressing toward goals  Plan Current plan remains appropriate    Precautions / Restrictions Precautions Precautions: Fall Restrictions Weight Bearing Restrictions: No   Pertinent Vitals/Pain 3/10; premed, ice packs provided    Mobility  Bed Mobility Bed Mobility:  (Pt declines to attempt - stating no problems overnight) Transfers Transfers: Sit to Stand;Stand to Sit Sit to Stand: 5: Supervision Stand to Sit: 5: Supervision Details for Transfer Assistance: no cues needed Ambulation/Gait Ambulation/Gait Assistance: 4: Min guard;5: Supervision Ambulation Distance (Feet): 250 Feet Assistive device: Rolling walker Ambulation/Gait Assistance Details: cues for posture and position from RW Gait Pattern: Step-to pattern;Step-through pattern General Gait Details: pt progressed to recip gait Stairs: Yes Stairs Assistance: 4: Min assist Stairs Assistance Details (indicate cue type and reason): cues for sequence and foot/crutch placement Stair Management Technique: One rail Left;Step to pattern;With crutches Number of Stairs: 8    Exercises Total Joint Exercises Ankle Circles/Pumps: AROM;Both;Supine;20 reps Quad Sets: AROM;Both;Supine;20 reps Gluteal Sets: 10 reps;Supine;AROM;Both Heel Slides: AAROM;Right;Supine;20 reps Hip ABduction/ADduction:  AAROM;Right;Supine;20 reps   PT Diagnosis:    PT Problem List:   PT Treatment Interventions:     PT Goals (current goals can now be found in the care plan section) Acute Rehab PT Goals Patient Stated Goal: Resume previous lifestyle with decreased pain PT Goal Formulation: With patient Potential to Achieve Goals: Good  Visit Information  Last PT Received On: 07/22/13 Assistance Needed: +1    Subjective Data  Subjective: I've been a little nauseous but doing ok now Patient Stated Goal: Resume previous lifestyle with decreased pain   Cognition  Cognition Arousal/Alertness: Awake/alert Behavior During Therapy: WFL for tasks assessed/performed Overall Cognitive Status: Within Functional Limits for tasks assessed    Balance     End of Session PT - End of Session Equipment Utilized During Treatment: Gait belt Activity Tolerance: Patient tolerated treatment well Patient left: in chair;with call bell/phone within reach Nurse Communication: Mobility status   GP     [name] 07/22/2013, 12:26 PM

## 2013-07-22 NOTE — Care Management Note (Signed)
    Page 1 of 2   07/22/2013     11:53:30 AM   CARE MANAGEMENT NOTE 07/22/2013  Patient:  [name]   Account Number:  [id number]  Date Initiated:  07/21/2013  Documentation initiated by:  Colleen Can  Subjective/Objective Assessment:   dx total right knee replacemnt.     Action/Plan:   Plans home in [place] where daughter will be caregiver.  She will need DME. Genevieve Norlander will provide hhpt services day after discharge   Anticipated DC Date:  07/22/2013   Anticipated DC Plan:  HOME W HOME HEALTH SERVICES      DC Planning Services  CM consult      PAC Choice  DURABLE MEDICAL EQUIPMENT  HOME HEALTH   Choice offered to / List presented to:  C-1 Patient   DME arranged  3-N-1  Levan Hurst      DME agency  [facility]     HH arranged  HH-2 PT      [facility] agency  [facility]   Status of service:  Completed, signed off Medicare Important Message given?   (If response is "NO", the following Medicare IM given date fields will be blank) Date Medicare IM given:   Date Additional Medicare IM given:    Discharge Disposition:  HOME W HOME HEALTH SERVICES  Per UR Regulation:    If discussed at Long Length of Stay Meetings, dates discussed:    Comments:

## 2013-07-23 NOTE — Discharge Summary (Signed)
Physician Discharge Summary  Patient ID: [name] MRN: [id number] DOB/AGE: 1956-10-21 57 y.o.  Admit date: 07/20/2013 Discharge date: 07/22/2013   Procedures:  Procedure(s) (LRB): RIGHT TOTAL HIP ARTHROPLASTY ANTERIOR APPROACH (Right)  Attending Physician:  Dr. Durene Romans   Admission Diagnoses:   Right hip OA / pain  Discharge Diagnoses:  Principal Problem:   S/P right THA, AA Active Problems:   Expected blood loss anemia   Morbid obesity  Past Medical History  Diagnosis Date  . Allergic rhinitis   . HTN (hypertension)   . Osteoarthritis     Right Hip - SEVERE  . GERD (gastroesophageal reflux disease)   . Abnormal glucose 2009    HPI:    [name], 56 y.o. female, has a history of pain and functional disability in the right hip(s) due to arthritis and patient has failed non-surgical conservative treatments for greater than 12 weeks to include NSAID's and/or analgesics and activity modification. Onset of symptoms was gradual starting 7 years ago with rapidlly worsening over the last 3.5 years.The patient noted no past surgery on the right hip(s). Patient currently rates pain in the right hip at 10 out of 10 with activity. Patient has night pain, worsening of pain with activity and weight bearing, trendelenberg gait, pain that interfers with activities of daily living and pain with passive range of motion. Patient has evidence of periarticular osteophytes and joint space narrowing by imaging studies. This condition presents safety issues increasing the risk of falls. There is no current active infection. Risks, benefits and expectations were discussed with the patient. Patient understand the risks, benefits and expectations and wishes to proceed with surgery.  PCP: Shelby Primes, MD   Discharged Condition: good  Hospital Course:  Patient underwent the above stated procedure on 07/20/2013. Patient tolerated the procedure well and brought to the recovery room in good  condition and subsequently to the floor.  POD #1 BP: 130/60 ; Pulse: 66 ; Temp: 98.3 F (36.8 C) ; Resp: 18  Pt's foley was removed, as well as the hemovac drain removed. IV was changed to a saline lock. Patient reports pain as mild, pain well controlled. No events throughout the night. Neurovascular intact, dorsiflexion/plantar flexion intact, incision: dressing C/D/I, no cellulitis present and compartment soft.   LABS  Basename    HGB  10.2  HCT  31.9   POD #2  BP: 114/50 ; Pulse: 67 ; Temp: 97.9 F (36.6 C) ; Resp: 17  Patient reports pain as mild, pain well controlled. No events throughout the night. Ready to be discharged home. Neurovascular intact, dorsiflexion/plantar flexion intact, incision: dressing C/D/I, no cellulitis present and compartment soft.   LABS  Basename    HGB  9.4  HCT  29.4     Discharge Exam: General appearance: alert, cooperative and no distress Extremities: Homans sign is negative, no sign of DVT, no edema, redness or tenderness in the calves or thighs and no ulcers, gangrene or trophic changes  Disposition:   Home or Self Care with follow up in 2 weeks   Follow-up Information   Follow up with Shelby Pal, MD. Schedule an appointment as soon as possible for a visit in 2 weeks.   Specialty:  Orthopedic Surgery   Contact information:   [address] [place] [address] [phone]       Discharge Orders   Future Orders Complete By Expires   Call MD / Call 911  As directed  Comments:     If you experience chest pain or shortness of breath, CALL 911 and be transported to the hospital emergency room.  If you develope a fever above 101 F, pus (white drainage) or increased drainage or redness at the wound, or calf pain, call your surgeon's office.   Change dressing  As directed    Comments:     Maintain surgical dressing for 10-14 days, then replace with 4x4 guaze and tape. Keep the area dry and clean.   Constipation  Prevention  As directed    Comments:     Drink plenty of fluids.  Prune juice may be helpful.  You may use a stool softener, such as Colace (over the counter) 100 mg twice a day.  Use MiraLax (over the counter) for constipation as needed.   Diet - low sodium heart healthy  As directed    Discharge instructions  As directed    Comments:     Maintain surgical dressing for 10-14 days, then replace with gauze and tape. Keep the area dry and clean until follow up. Follow up in 2 weeks at [facility]. Call with any questions or concerns.   Increase activity slowly as tolerated  As directed    TED hose  As directed    Comments:     Use stockings (TED hose) for 2 weeks on both leg(s).  You may remove them at night for sleeping.   Weight bearing as tolerated  As directed         Medication List    STOP taking these medications       diclofenac sodium 1 % Gel  Commonly known as:  VOLTAREN      TAKE these medications       aspirin 325 MG EC tablet  Take 1 tablet (325 mg total) by mouth 2 (two) times daily.     carvedilol 25 MG tablet  Commonly known as:  COREG  Take 1 tablet (25 mg total) by mouth 2 (two) times daily with a meal.     Cholecalciferol 1000 UNITS tablet  Take 1,000 Units by mouth daily.     DSS 100 MG Caps  Take 100 mg by mouth 2 (two) times daily.     ferrous sulfate 325 (65 FE) MG tablet  Take 1 tablet (325 mg total) by mouth 3 (three) times daily after meals.     fish oil-omega-3 fatty acids 1000 MG capsule  Take 1 g by mouth daily.     furosemide 20 MG tablet  Commonly known as:  LASIX  Take 20 mg by mouth every morning.     HYDROcodone-acetaminophen 7.5-325 MG per tablet  Commonly known as:  NORCO  Take 1-2 tablets by mouth every 4 (four) hours as needed for pain.     methocarbamol 500 MG tablet  Commonly known as:  ROBAXIN  Take 1 tablet (500 mg total) by mouth every 6 (six) hours as needed (muscle spasms).     polyethylene glycol packet    Commonly known as:  MIRALAX / GLYCOLAX  Take 17 g by mouth 2 (two) times daily.         Signed: Anastasio Auerbach. Penda Venturi   PAC  07/23/2013, 10:35 AM

## 2013-12-09 LAB — HM MAMMOGRAPHY

## 2013-12-26 ENCOUNTER — Other Ambulatory Visit: Payer: Self-pay | Admitting: Internal Medicine

## 2013-12-27 ENCOUNTER — Other Ambulatory Visit: Payer: Self-pay | Admitting: Obstetrics and Gynecology

## 2014-02-20 ENCOUNTER — Other Ambulatory Visit: Payer: Self-pay | Admitting: Internal Medicine

## 2014-07-09 IMAGING — CR DG CHEST 2V
2 series · 2 of 2 positions shown · non-contrast
Comparison: 01/25/2010

CLINICAL DATA: Preop for right hip replacement.  History of cardiac
surgery.

CHEST - 2 VIEW

[w chest pa]
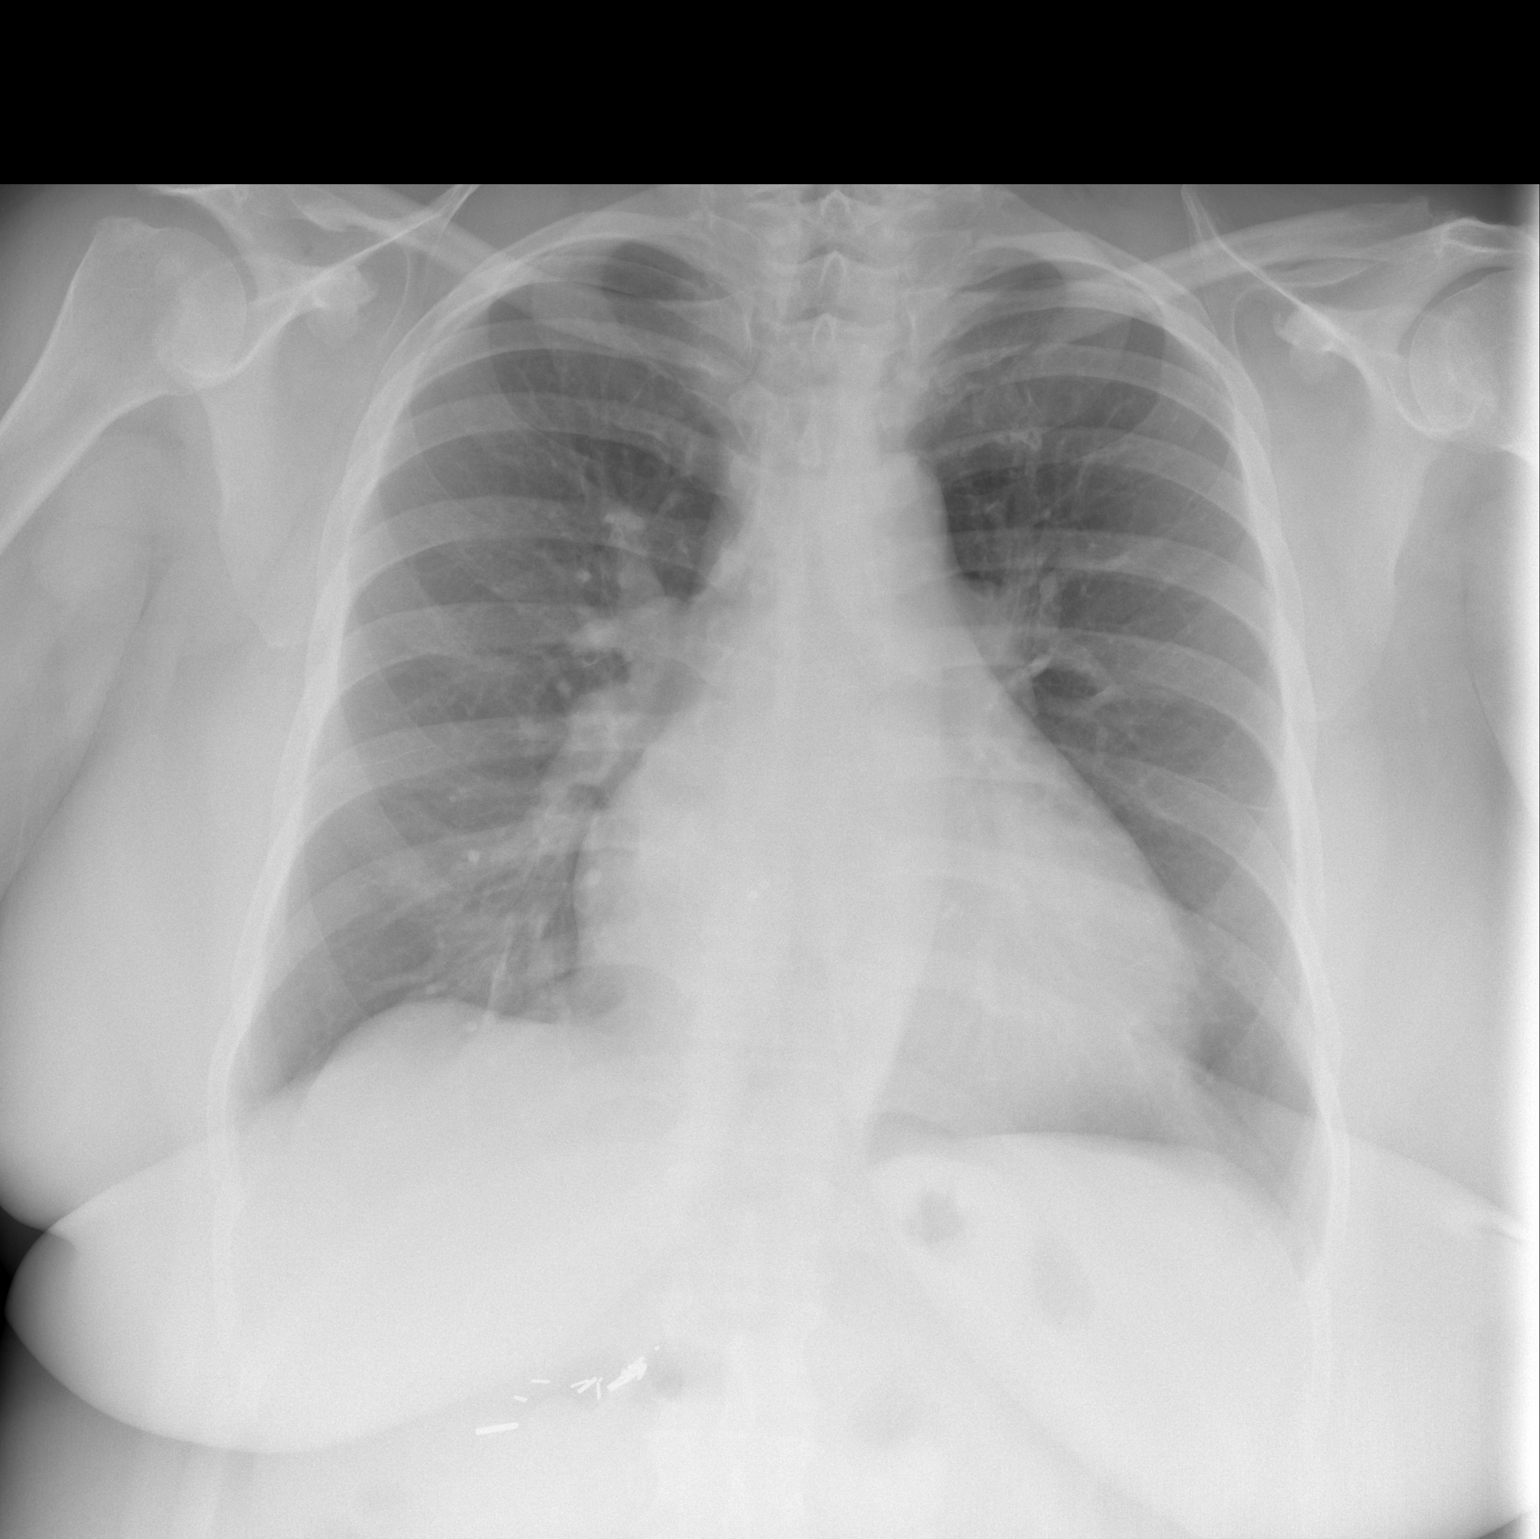

[w chest lat]
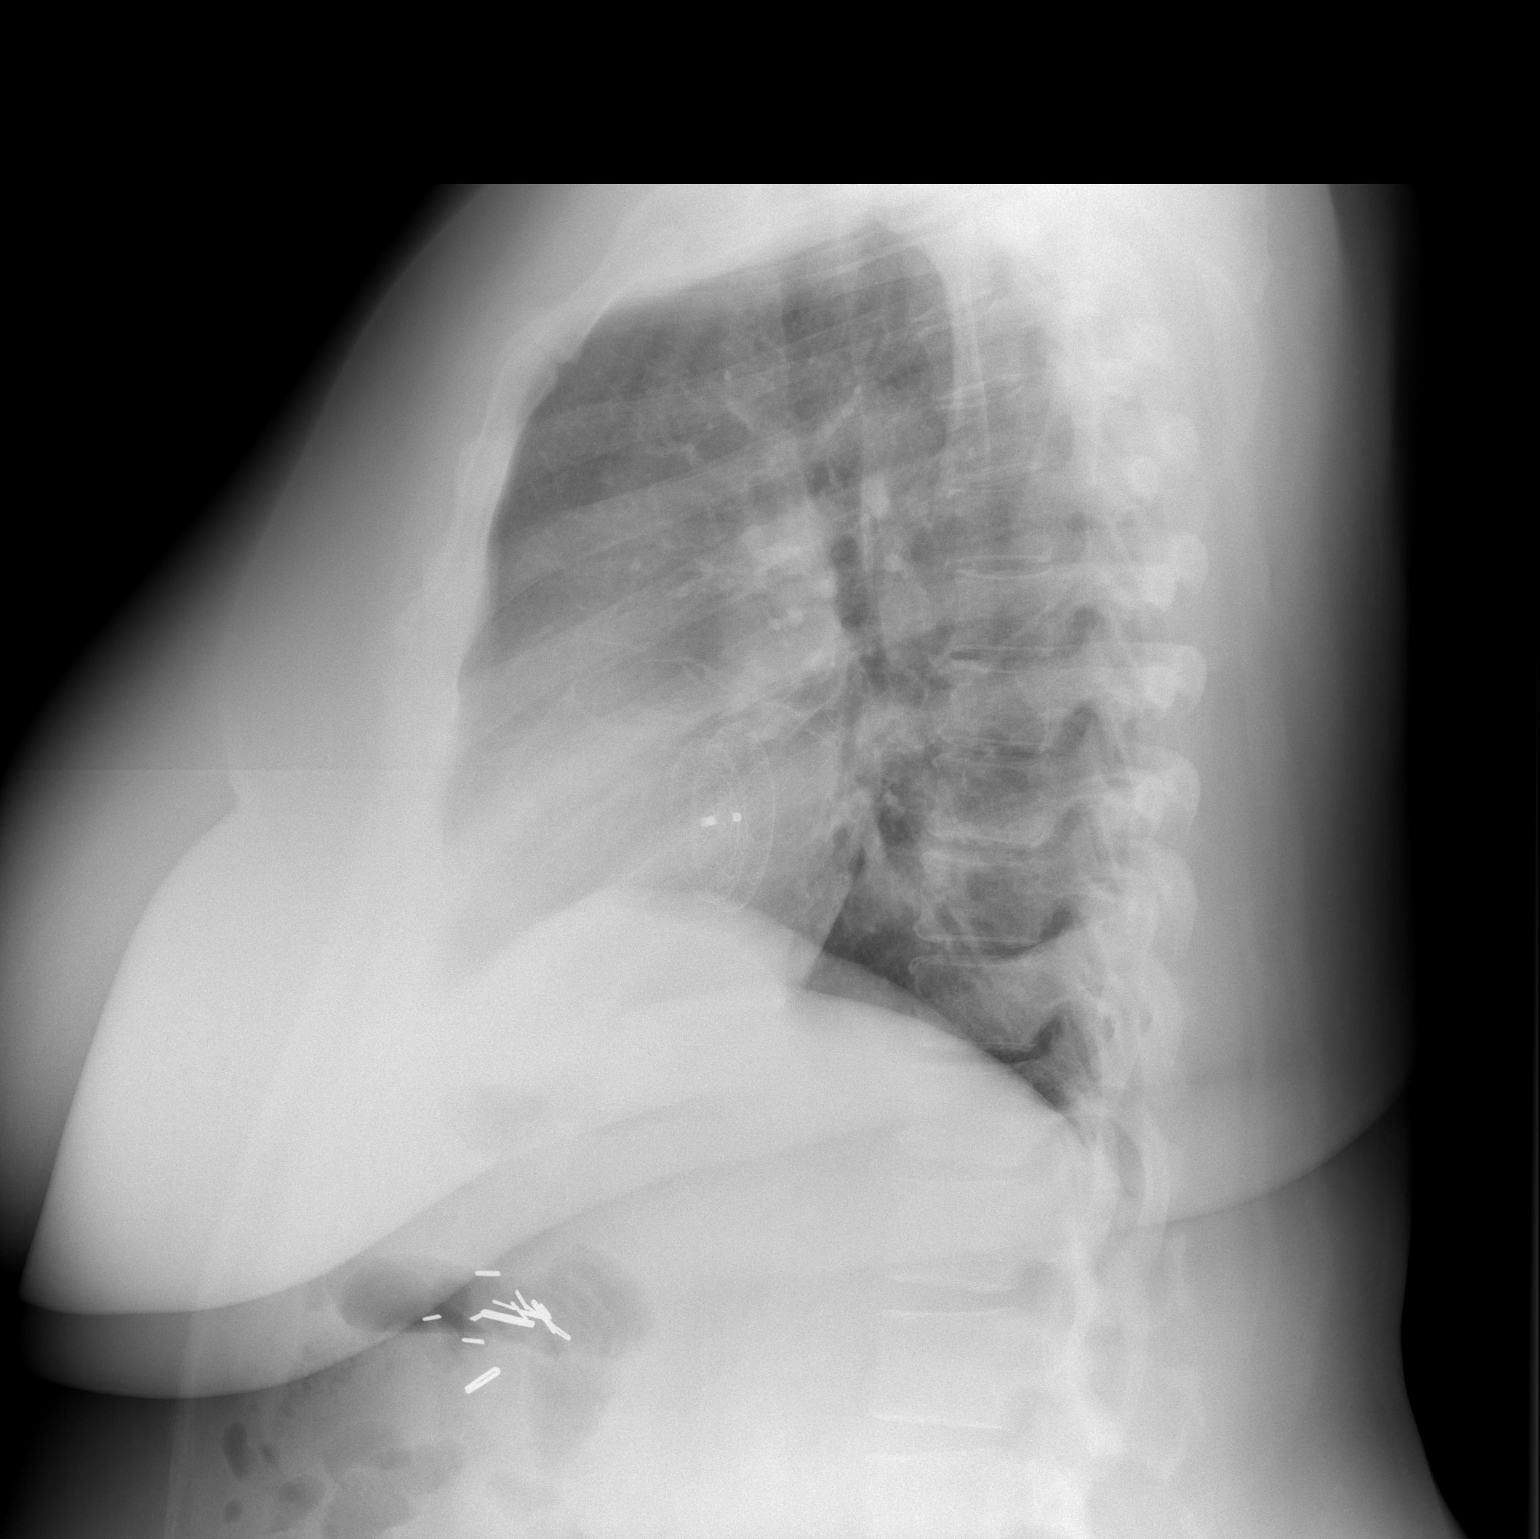

[2 of 2 positions shown; findings below may reference images not displayed]

FINDINGS: The atrial septal defect closure device is unchanged.
The cardiac silhouette is mildly enlarged.  The mediastinum is
normal in contour.  There are no hilar masses.  The lungs are
clear.  No pleural effusion or pneumothorax.

The bony thorax is demineralized but intact.
IMPRESSION: No acute cardiopulmonary disease.

## 2014-07-15 ENCOUNTER — Encounter: Payer: BC Managed Care – PPO | Admitting: Internal Medicine

## 2014-07-16 IMAGING — CR DG PORTABLE PELVIS
1 series · 1 of 1 positions shown · non-contrast
Comparison: 03/31/2007

CLINICAL DATA: Postop right hip

PORTABLE PELVIS

[AP]
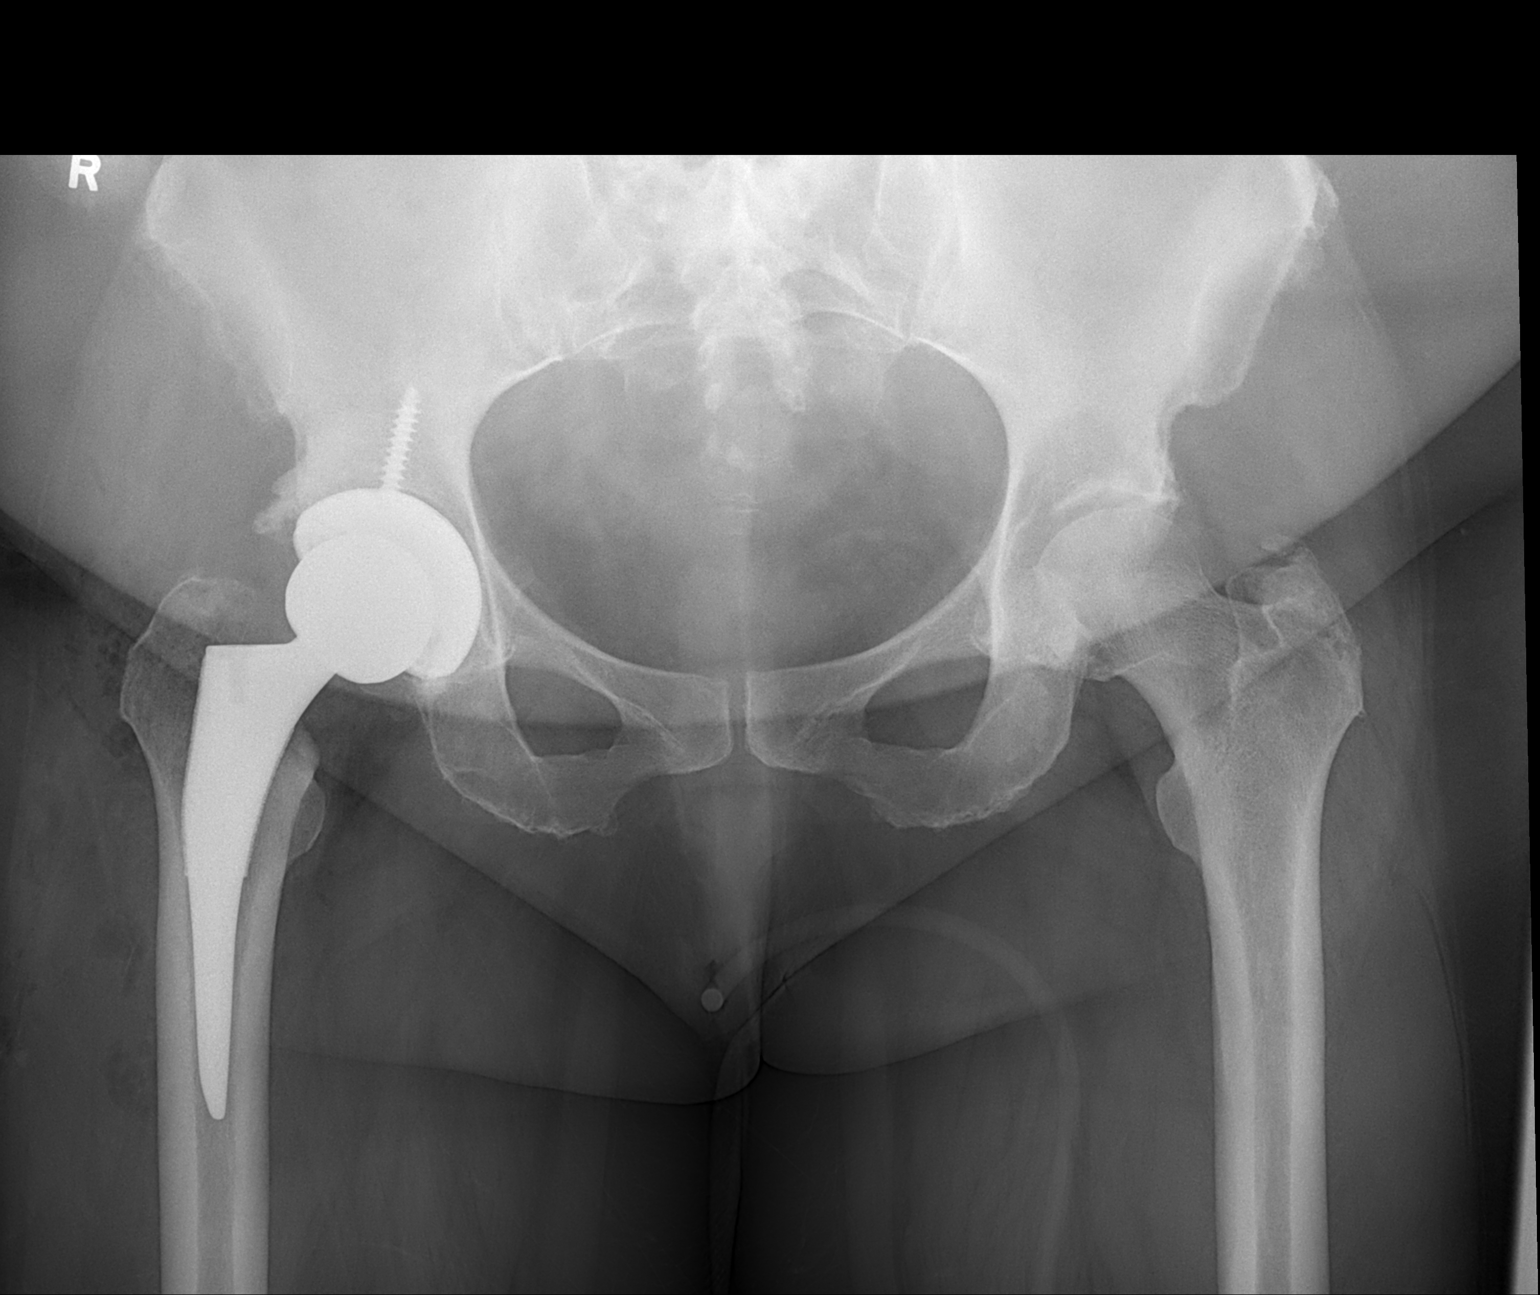

[1 of 1 positions shown; findings below may reference images not displayed]

FINDINGS: Right total hip replacement in satisfactory position and
alignment.  No fracture or immediate complication.
IMPRESSION: Satisfactory right hip replacement.

## 2014-07-16 IMAGING — CR DG HIP 1V PORT*R*
1 series · 1 of 1 positions shown · non-contrast
Comparison: 07/20/2013

CLINICAL DATA: Postop right hip replacement

PORTABLE RIGHT HIP - 1 VIEW

[AP]
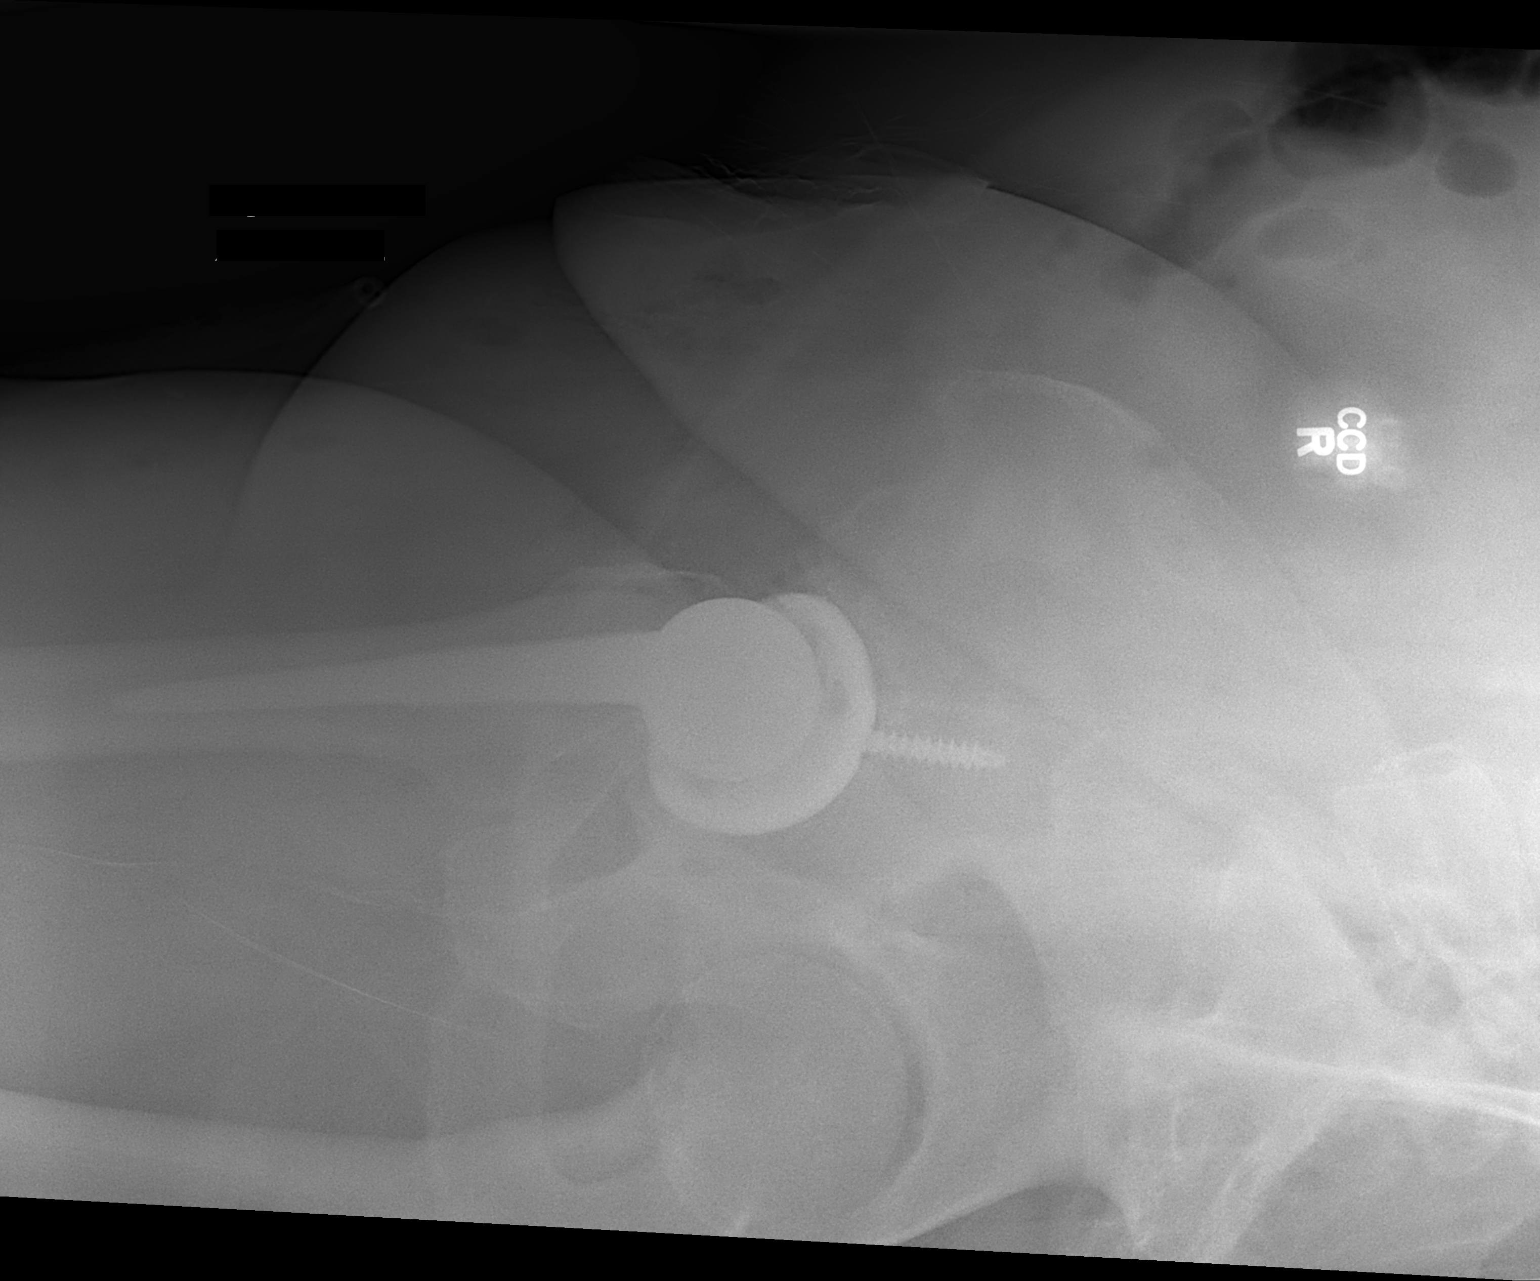

[1 of 1 positions shown; findings below may reference images not displayed]

FINDINGS: Lateral view of the right hip reveals satisfactory
alignment.  Right hip replacement without immediate complication.
IMPRESSION: Satisfactory right hip replacement with normal alignment.

## 2014-07-27 ENCOUNTER — Ambulatory Visit (INDEPENDENT_AMBULATORY_CARE_PROVIDER_SITE_OTHER): Payer: BC Managed Care – PPO | Admitting: Internal Medicine

## 2014-07-27 ENCOUNTER — Encounter: Payer: Self-pay | Admitting: Internal Medicine

## 2014-07-27 VITALS — BP 120/80 | HR 80 | Temp 98.8°F | Resp 16 | Ht 64.0 in | Wt 241.0 lb

## 2014-07-27 DIAGNOSIS — M1990 Unspecified osteoarthritis, unspecified site: Secondary | ICD-10-CM

## 2014-07-27 DIAGNOSIS — L309 Dermatitis, unspecified: Secondary | ICD-10-CM

## 2014-07-27 DIAGNOSIS — I10 Essential (primary) hypertension: Secondary | ICD-10-CM

## 2014-07-27 DIAGNOSIS — L259 Unspecified contact dermatitis, unspecified cause: Secondary | ICD-10-CM

## 2014-07-27 MED ORDER — LORATADINE 10 MG PO TABS: 10.0000 mg | ORAL_TABLET | Freq: Every day | ORAL | Status: DC

## 2014-07-27 MED ORDER — METHYLPREDNISOLONE ACETATE 80 MG/ML IJ SUSP
80.0000 mg | Freq: Once | INTRAMUSCULAR | Status: AC
  Administered 2014-07-27: 80 mg via INTRAMUSCULAR

## 2014-07-27 MED ORDER — TRIAMCINOLONE ACETONIDE 0.5 % EX CREA: 1.0000 "application " | TOPICAL_CREAM | Freq: Three times a day (TID) | CUTANEOUS | Status: DC

## 2014-07-27 NOTE — Assessment & Plan Note (Signed)
Continue with current prescription therapy as reflected on the Med list.  

## 2014-07-27 NOTE — Assessment & Plan Note (Signed)
Chronic 8/15 severe exacerbation  Depomedrol 80 mg IM Triamc 0.5% cream

## 2014-07-27 NOTE — Progress Notes (Signed)
Subjective:  Rash This is a new problem. The current episode started more than 1 month ago. The problem has been gradually worsening since onset. The affected locations include the face, lips, left hand, right arm and right hand. The rash is characterized by burning, itchiness, peeling, redness, swelling and scaling. She was exposed to nothing. Pertinent negatives include no congestion, cough, diarrhea or fatigue. The treatment provided no relief. Her past medical history is significant for eczema.      The patient presents for a follow-up of  chronic hypertension, eczema,  chronic dyslipidemia, obesity controlled. F/u pain and numbness in the L thigh x weeks - better  She had a THR in 8/14     BP Readings from Last 3 Encounters:  07/27/14 120/80  07/22/13 114/50  07/22/13 114/50   Wt Readings from Last 3 Encounters:  07/27/14 241 lb (109.317 kg)  07/20/13 247 lb (112.038 kg)  07/20/13 247 lb (112.038 kg)    Review of Systems  Constitutional: Positive for unexpected weight change. Negative for chills, activity change, appetite change and fatigue.  HENT: Negative for congestion, mouth sores and sinus pressure.   Eyes: Negative for visual disturbance.  Respiratory: Negative for cough and chest tightness.   Gastrointestinal: Negative for nausea, abdominal pain, diarrhea and constipation.  Genitourinary: Negative for frequency, difficulty urinating and vaginal pain.  Musculoskeletal: Negative for back pain and gait problem.  Skin: Negative for pallor and rash.  Neurological: Negative for dizziness, tremors, weakness, numbness and headaches.  Psychiatric/Behavioral: Negative for suicidal ideas, confusion and sleep disturbance. The patient is not nervous/anxious.        Objective:   Physical Exam  Constitutional: She appears well-developed. No distress.  obese  HENT:  Head: Normocephalic.  Right Ear: External ear normal.  Left Ear: External ear normal.  Nose: Nose  normal.  Mouth/Throat: Oropharynx is clear and moist.  Eyes: Conjunctivae are normal. Pupils are equal, round, and reactive to light. Right eye exhibits no discharge. Left eye exhibits no discharge.  Neck: Normal range of motion. Neck supple. No JVD present. No tracheal deviation present. No thyromegaly present.  Cardiovascular: Normal rate, regular rhythm and normal heart sounds.   Pulmonary/Chest: No stridor. No respiratory distress. She has no wheezes.  Abdominal: Soft. Bowel sounds are normal. She exhibits no distension and no mass. There is no tenderness. There is no rebound and no guarding.  Musculoskeletal: She exhibits no edema and no tenderness.  Lymphadenopathy:    She has no cervical adenopathy.  Neurological: She displays normal reflexes. No cranial nerve deficit. She exhibits normal muscle tone. Coordination normal.  Skin: Rash noted. She is not diaphoretic. There is erythema.  eryth scaly rash on face, dist neck, hands, V neck  Psychiatric: She has a normal mood and affect. Her behavior is normal. Judgment and thought content normal.   Lab Results  Component Value Date   WBC 11.4* 07/22/2013   HGB 9.4* 07/22/2013   HCT 29.4* 07/22/2013   PLT 173 07/22/2013   GLUCOSE 151* 07/22/2013   CHOL 182 09/28/2012   TRIG 102.0 09/28/2012   HDL 37.30* 09/28/2012   LDLCALC 124* 09/28/2012   ALT 18 09/28/2012   AST 15 09/28/2012   NA 139 07/22/2013   K 4.1 07/22/2013   CL 105 07/22/2013   CREATININE 1.03 07/22/2013   BUN 16 07/22/2013   CO2 30 07/22/2013   TSH 1.04 09/28/2012   INR 1.09 07/13/2013   HGBA1C 6.3 10/26/2010  Assessment & Plan:

## 2014-07-27 NOTE — Assessment & Plan Note (Signed)
Better after R THR

## 2014-07-27 NOTE — Progress Notes (Signed)
Pre visit review using our clinic review tool, if applicable. No additional management support is needed unless otherwise documented below in the visit note. 

## 2014-08-01 ENCOUNTER — Other Ambulatory Visit: Payer: Self-pay

## 2014-08-22 ENCOUNTER — Other Ambulatory Visit: Payer: Self-pay | Admitting: *Deleted

## 2014-08-22 ENCOUNTER — Other Ambulatory Visit (INDEPENDENT_AMBULATORY_CARE_PROVIDER_SITE_OTHER): Payer: BC Managed Care – PPO

## 2014-08-22 DIAGNOSIS — Z0000 Encounter for general adult medical examination without abnormal findings: Secondary | ICD-10-CM

## 2014-08-22 LAB — URINALYSIS, ROUTINE W REFLEX MICROSCOPIC
BILIRUBIN URINE: NEGATIVE
Ketones, ur: NEGATIVE
LEUKOCYTES UA: NEGATIVE
Nitrite: NEGATIVE
SPECIFIC GRAVITY, URINE: 1.025 (ref 1.000–1.030)
Total Protein, Urine: NEGATIVE
Urine Glucose: NEGATIVE
Urobilinogen, UA: 0.2 (ref 0.0–1.0)
pH: 5.5 (ref 5.0–8.0)

## 2014-08-22 LAB — BASIC METABOLIC PANEL
BUN: 10 mg/dL (ref 6–23)
CALCIUM: 9.3 mg/dL (ref 8.4–10.5)
CO2: 28 mEq/L (ref 19–32)
Chloride: 104 mEq/L (ref 96–112)
Creatinine, Ser: 0.9 mg/dL (ref 0.4–1.2)
GFR: 70.7 mL/min (ref >60.00)
GLUCOSE: 89 mg/dL (ref 70–99)
Potassium: 4 mEq/L (ref 3.5–5.1)
Sodium: 141 mEq/L (ref 135–145)

## 2014-08-22 LAB — CBC WITH DIFFERENTIAL/PLATELET
BASOS PCT: 0.4 % (ref 0.0–3.0)
Basophils Absolute: 0 10*3/uL (ref 0.0–0.1)
EOS PCT: 3.6 % (ref 0.0–5.0)
Eosinophils Absolute: 0.3 10*3/uL (ref 0.0–0.7)
HEMATOCRIT: 38.8 % (ref 36.0–46.0)
Hemoglobin: 12.9 g/dL (ref 12.0–15.0)
LYMPHS ABS: 1.4 10*3/uL (ref 0.7–4.0)
Lymphocytes Relative: 18.8 % (ref 12.0–46.0)
MCHC: 33.1 g/dL (ref 30.0–36.0)
MCV: 91.7 fl (ref 78.0–100.0)
Monocytes Absolute: 0.5 10*3/uL (ref 0.1–1.0)
Monocytes Relative: 6.9 % (ref 3.0–12.0)
NEUTROS ABS: 5.4 10*3/uL (ref 1.4–7.7)
Neutrophils Relative %: 70.3 % (ref 43.0–77.0)
Platelets: 248 10*3/uL (ref 150.0–400.0)
RBC: 4.23 Mil/uL (ref 3.87–5.11)
RDW: 13.8 % (ref 11.5–15.5)
WBC: 7.7 10*3/uL (ref 4.0–10.5)

## 2014-08-22 LAB — LIPID PANEL
CHOL/HDL RATIO: 4
Cholesterol: 170 mg/dL (ref 0–200)
HDL: 38.6 mg/dL — ABNORMAL LOW (ref >39.00)
LDL CALC: 119 mg/dL — AB (ref 0–99)
NONHDL: 131.4
Triglycerides: 64 mg/dL (ref 0.0–149.0)
VLDL: 12.8 mg/dL (ref 0.0–40.0)

## 2014-08-22 LAB — TSH: TSH: 1.08 u[IU]/mL (ref 0.35–4.50)

## 2014-08-22 LAB — HEPATIC FUNCTION PANEL
ALBUMIN: 3.7 g/dL (ref 3.5–5.2)
ALT: 16 U/L (ref 0–35)
AST: 14 U/L (ref 0–37)
Alkaline Phosphatase: 63 U/L (ref 39–117)
Bilirubin, Direct: 0.1 mg/dL (ref 0.0–0.3)
TOTAL PROTEIN: 7.5 g/dL (ref 6.0–8.3)
Total Bilirubin: 0.6 mg/dL (ref 0.2–1.2)

## 2014-08-26 ENCOUNTER — Encounter: Payer: Self-pay | Admitting: Internal Medicine

## 2014-08-26 ENCOUNTER — Ambulatory Visit (INDEPENDENT_AMBULATORY_CARE_PROVIDER_SITE_OTHER): Payer: BC Managed Care – PPO | Admitting: Internal Medicine

## 2014-08-26 VITALS — BP 120/80 | HR 80 | Temp 98.5°F | Resp 16 | Wt 239.0 lb

## 2014-08-26 DIAGNOSIS — K2100 Gastro-esophageal reflux disease with esophagitis, without bleeding: Secondary | ICD-10-CM

## 2014-08-26 DIAGNOSIS — L259 Unspecified contact dermatitis, unspecified cause: Secondary | ICD-10-CM

## 2014-08-26 DIAGNOSIS — I10 Essential (primary) hypertension: Secondary | ICD-10-CM

## 2014-08-26 DIAGNOSIS — Z23 Encounter for immunization: Secondary | ICD-10-CM

## 2014-08-26 DIAGNOSIS — E669 Obesity, unspecified: Secondary | ICD-10-CM

## 2014-08-26 DIAGNOSIS — L309 Dermatitis, unspecified: Secondary | ICD-10-CM

## 2014-08-26 MED ORDER — TRIAMCINOLONE ACETONIDE 0.5 % EX CREA: 1.0000 "application " | TOPICAL_CREAM | Freq: Three times a day (TID) | CUTANEOUS | Status: DC

## 2014-08-26 MED ORDER — CARVEDILOL 25 MG PO TABS: 25.0000 mg | ORAL_TABLET | Freq: Two times a day (BID) | ORAL | Status: DC

## 2014-08-26 MED ORDER — FUROSEMIDE 20 MG PO TABS: 20.0000 mg | ORAL_TABLET | Freq: Every day | ORAL | Status: DC | PRN

## 2014-08-26 NOTE — Progress Notes (Signed)
Subjective:  Rash This is a new problem. The current episode started more than 1 month ago. The problem has been gradually improving since onset. The affected locations include the face, lips, left hand, right arm and right hand. The rash is characterized by burning, itchiness, peeling, redness, swelling and scaling. She was exposed to nothing. Pertinent negatives include no congestion, cough, diarrhea or fatigue. The treatment provided no relief. Her past medical history is significant for eczema.      The patient presents for a follow-up of  chronic hypertension, eczema,  chronic dyslipidemia, obesity controlled. F/u pain and numbness in the L thigh x weeks - better  She had a THR in 8/14     BP Readings from Last 3 Encounters:  08/26/14 120/80  07/27/14 120/80  07/22/13 114/50   Wt Readings from Last 3 Encounters:  08/26/14 239 lb (108.41 kg)  07/27/14 241 lb (109.317 kg)  07/20/13 247 lb (112.038 kg)    Review of Systems  Constitutional: Positive for unexpected weight change. Negative for chills, activity change, appetite change and fatigue.  HENT: Negative for congestion, mouth sores and sinus pressure.   Eyes: Negative for visual disturbance.  Respiratory: Negative for cough and chest tightness.   Gastrointestinal: Negative for nausea, abdominal pain, diarrhea and constipation.  Genitourinary: Negative for frequency, difficulty urinating and vaginal pain.  Musculoskeletal: Negative for back pain and gait problem.  Skin: Negative for pallor and rash.  Neurological: Negative for dizziness, tremors, weakness, numbness and headaches.  Psychiatric/Behavioral: Negative for suicidal ideas, confusion and sleep disturbance. The patient is not nervous/anxious.        Objective:   Physical Exam  Constitutional: She appears well-developed. No distress.  obese  HENT:  Head: Normocephalic.  Right Ear: External ear normal.  Left Ear: External ear normal.  Nose: Nose normal.   Mouth/Throat: Oropharynx is clear and moist.  Eyes: Conjunctivae are normal. Pupils are equal, round, and reactive to light. Right eye exhibits no discharge. Left eye exhibits no discharge.  Neck: Normal range of motion. Neck supple. No JVD present. No tracheal deviation present. No thyromegaly present.  Cardiovascular: Normal rate, regular rhythm and normal heart sounds.   Pulmonary/Chest: No stridor. No respiratory distress. She has no wheezes.  Abdominal: Soft. Bowel sounds are normal. She exhibits no distension and no mass. There is no tenderness. There is no rebound and no guarding.  Musculoskeletal: She exhibits no edema and no tenderness.  Lymphadenopathy:    She has no cervical adenopathy.  Neurological: She displays normal reflexes. No cranial nerve deficit. She exhibits normal muscle tone. Coordination normal.  Skin: Rash noted. She is not diaphoretic. There is erythema.  eryth scaly rash on face, dist neck, hands, V neck  Psychiatric: She has a normal mood and affect. Her behavior is normal. Judgment and thought content normal.   Lab Results  Component Value Date   WBC 7.7 08/22/2014   HGB 12.9 08/22/2014   HCT 38.8 08/22/2014   PLT 248.0 08/22/2014   GLUCOSE 89 08/22/2014   CHOL 170 08/22/2014   TRIG 64.0 08/22/2014   HDL 38.60* 08/22/2014   LDLCALC 119* 08/22/2014   ALT 16 08/22/2014   AST 14 08/22/2014   NA 141 08/22/2014   K 4.0 08/22/2014   CL 104 08/22/2014   CREATININE 0.9 08/22/2014   BUN 10 08/22/2014   CO2 28 08/22/2014   TSH 1.08 08/22/2014   INR 1.09 07/13/2013   HGBA1C 6.3 10/26/2010  Assessment & Plan:

## 2014-08-26 NOTE — Progress Notes (Signed)
Pre visit review using our clinic review tool, if applicable. No additional management support is needed unless otherwise documented below in the visit note. 

## 2014-09-05 ENCOUNTER — Encounter: Payer: Self-pay | Admitting: Internal Medicine

## 2014-09-05 NOTE — Assessment & Plan Note (Signed)
Better Continue with current prescription therapy as reflected on the Med list.  

## 2014-09-05 NOTE — Assessment & Plan Note (Signed)
Continue with current prescription therapy as reflected on the Med list. BP Readings from Last 3 Encounters:  08/26/14 120/80  07/27/14 120/80  07/22/13 114/50

## 2014-09-05 NOTE — Assessment & Plan Note (Signed)
Wt Readings from Last 3 Encounters:  08/26/14 239 lb (108.41 kg)  07/27/14 241 lb (109.317 kg)  07/20/13 247 lb (112.038 kg)   Options discussed

## 2014-09-05 NOTE — Assessment & Plan Note (Signed)
Continue with current prescription therapy as reflected on the Med list.  

## 2014-11-21 ENCOUNTER — Encounter: Payer: Self-pay | Admitting: Internal Medicine

## 2014-12-13 ENCOUNTER — Other Ambulatory Visit: Payer: Self-pay | Admitting: Internal Medicine

## 2015-01-09 ENCOUNTER — Other Ambulatory Visit: Payer: Self-pay | Admitting: Obstetrics and Gynecology

## 2015-01-10 LAB — CYTOLOGY - PAP

## 2015-02-24 ENCOUNTER — Ambulatory Visit (INDEPENDENT_AMBULATORY_CARE_PROVIDER_SITE_OTHER): Payer: PRIVATE HEALTH INSURANCE | Admitting: Internal Medicine

## 2015-02-24 VITALS — BP 124/70 | HR 78 | Temp 98.5°F | Resp 16 | Ht 64.0 in | Wt 243.2 lb

## 2015-02-24 DIAGNOSIS — R739 Hyperglycemia, unspecified: Secondary | ICD-10-CM

## 2015-02-24 DIAGNOSIS — I10 Essential (primary) hypertension: Secondary | ICD-10-CM

## 2015-02-24 DIAGNOSIS — E669 Obesity, unspecified: Secondary | ICD-10-CM

## 2015-02-24 MED ORDER — CARVEDILOL 25 MG PO TABS: 25.0000 mg | ORAL_TABLET | Freq: Two times a day (BID) | ORAL | Status: DC

## 2015-02-24 MED ORDER — FUROSEMIDE 20 MG PO TABS: ORAL_TABLET | ORAL | Status: DC

## 2015-02-24 NOTE — Progress Notes (Signed)
   Subjective:  Rash This is a chronic problem. The problem has been gradually improving since onset. The affected locations include the left arm, torso, right upper leg, right lower leg and left buttock. Pertinent negatives include no congestion, cough, diarrhea or fatigue.      The patient presents for a follow-up of  chronic hypertension, eczema,  chronic dyslipidemia, obesity controlled. F/u pain and numbness in the L thigh x weeks - better  She had a THR in 8/14     BP Readings from Last 3 Encounters:  02/24/15 124/70  08/26/14 120/80  07/27/14 120/80   Wt Readings from Last 3 Encounters:  02/24/15 243 lb 4 oz (110.337 kg)  08/26/14 239 lb (108.41 kg)  07/27/14 241 lb (109.317 kg)    Review of Systems  Constitutional: Positive for unexpected weight change. Negative for chills, activity change, appetite change and fatigue.  HENT: Negative for congestion, mouth sores and sinus pressure.   Eyes: Negative for visual disturbance.  Respiratory: Negative for cough and chest tightness.   Gastrointestinal: Negative for nausea, abdominal pain, diarrhea and constipation.  Genitourinary: Negative for frequency, difficulty urinating and vaginal pain.  Musculoskeletal: Negative for back pain and gait problem.  Skin: Negative for pallor and rash.  Neurological: Negative for dizziness, tremors, weakness, numbness and headaches.  Psychiatric/Behavioral: Negative for suicidal ideas, confusion and sleep disturbance. The patient is not nervous/anxious.        Objective:   Physical Exam  Constitutional: She appears well-developed. No distress.  obese  HENT:  Head: Normocephalic.  Right Ear: External ear normal.  Left Ear: External ear normal.  Nose: Nose normal.  Mouth/Throat: Oropharynx is clear and moist.  Eyes: Conjunctivae are normal. Pupils are equal, round, and reactive to light. Right eye exhibits no discharge. Left eye exhibits no discharge.  Neck: Normal range of motion.  Neck supple. No JVD present. No tracheal deviation present. No thyromegaly present.  Cardiovascular: Normal rate, regular rhythm and normal heart sounds.   Pulmonary/Chest: No stridor. No respiratory distress. She has no wheezes.  Abdominal: Soft. Bowel sounds are normal. She exhibits no distension and no mass. There is no tenderness. There is no rebound and no guarding.  Musculoskeletal: She exhibits no edema or tenderness.  Lymphadenopathy:    She has no cervical adenopathy.  Neurological: She displays normal reflexes. No cranial nerve deficit. She exhibits normal muscle tone. Coordination normal.  Skin: No rash noted. She is not diaphoretic. No erythema.  Psychiatric: She has a normal mood and affect. Her behavior is normal. Judgment and thought content normal.  rash is much better  Lab Results  Component Value Date   WBC 7.7 08/22/2014   HGB 12.9 08/22/2014   HCT 38.8 08/22/2014   PLT 248.0 08/22/2014   GLUCOSE 89 08/22/2014   CHOL 170 08/22/2014   TRIG 64.0 08/22/2014   HDL 38.60* 08/22/2014   LDLCALC 119* 08/22/2014   ALT 16 08/22/2014   AST 14 08/22/2014   NA 141 08/22/2014   K 4.0 08/22/2014   CL 104 08/22/2014   CREATININE 0.9 08/22/2014   BUN 10 08/22/2014   CO2 28 08/22/2014   TSH 1.08 08/22/2014   INR 1.09 07/13/2013   HGBA1C 6.3 10/26/2010         Assessment & Plan:

## 2015-02-24 NOTE — Assessment & Plan Note (Signed)
Wt Readings from Last 3 Encounters:  02/24/15 243 lb 4 oz (110.337 kg)  08/26/14 239 lb (108.41 kg)  07/27/14 241 lb (109.317 kg)

## 2015-02-24 NOTE — Progress Notes (Signed)
Pre visit review using our clinic review tool, if applicable. No additional management support is needed unless otherwise documented below in the visit note. 

## 2015-02-24 NOTE — Assessment & Plan Note (Signed)
A1c Wt loss 

## 2015-02-24 NOTE — Assessment & Plan Note (Signed)
Coreg, Furosemide

## 2015-03-13 ENCOUNTER — Encounter: Payer: Self-pay | Admitting: Internal Medicine

## 2015-05-09 ENCOUNTER — Telehealth: Payer: Self-pay | Admitting: Internal Medicine

## 2015-05-09 NOTE — Telephone Encounter (Signed)
Error

## 2015-08-28 ENCOUNTER — Other Ambulatory Visit (INDEPENDENT_AMBULATORY_CARE_PROVIDER_SITE_OTHER): Payer: PRIVATE HEALTH INSURANCE

## 2015-08-28 ENCOUNTER — Ambulatory Visit (INDEPENDENT_AMBULATORY_CARE_PROVIDER_SITE_OTHER): Payer: PRIVATE HEALTH INSURANCE | Admitting: Internal Medicine

## 2015-08-28 ENCOUNTER — Encounter: Payer: Self-pay | Admitting: Internal Medicine

## 2015-08-28 VITALS — BP 138/76 | HR 72 | Wt 252.0 lb

## 2015-08-28 DIAGNOSIS — Z23 Encounter for immunization: Secondary | ICD-10-CM | POA: Diagnosis not present

## 2015-08-28 DIAGNOSIS — R739 Hyperglycemia, unspecified: Secondary | ICD-10-CM

## 2015-08-28 DIAGNOSIS — E669 Obesity, unspecified: Secondary | ICD-10-CM

## 2015-08-28 DIAGNOSIS — I10 Essential (primary) hypertension: Secondary | ICD-10-CM

## 2015-08-28 LAB — TSH: TSH: 0.9 u[IU]/mL (ref 0.35–4.50)

## 2015-08-28 LAB — URINALYSIS
Bilirubin Urine: NEGATIVE
Leukocytes, UA: NEGATIVE
Nitrite: NEGATIVE
PH: 6 (ref 5.0–8.0)
SPECIFIC GRAVITY, URINE: 1.025 (ref 1.000–1.030)
Total Protein, Urine: NEGATIVE
URINE GLUCOSE: NEGATIVE
UROBILINOGEN UA: 1 (ref 0.0–1.0)

## 2015-08-28 LAB — BASIC METABOLIC PANEL
BUN: 15 mg/dL (ref 6–23)
CALCIUM: 9.4 mg/dL (ref 8.4–10.5)
CO2: 32 meq/L (ref 19–32)
Chloride: 105 mEq/L (ref 96–112)
Creatinine, Ser: 0.96 mg/dL (ref 0.40–1.20)
GFR: 62.89 mL/min (ref >60.00)
GLUCOSE: 88 mg/dL (ref 70–99)
POTASSIUM: 3.9 meq/L (ref 3.5–5.1)
SODIUM: 142 meq/L (ref 135–145)

## 2015-08-28 LAB — LIPID PANEL
Cholesterol: 181 mg/dL (ref 0–200)
HDL: 35.6 mg/dL — AB (ref >39.00)
LDL Cholesterol: 116 mg/dL — ABNORMAL HIGH (ref 0–99)
NONHDL: 145.68
TRIGLYCERIDES: 150 mg/dL — AB (ref 0.0–149.0)
Total CHOL/HDL Ratio: 5
VLDL: 30 mg/dL (ref 0.0–40.0)

## 2015-08-28 LAB — HEPATIC FUNCTION PANEL
ALBUMIN: 3.9 g/dL (ref 3.5–5.2)
ALT: 13 U/L (ref 0–35)
AST: 12 U/L (ref 0–37)
Alkaline Phosphatase: 64 U/L (ref 39–117)
Bilirubin, Direct: 0.1 mg/dL (ref 0.0–0.3)
Total Bilirubin: 0.4 mg/dL (ref 0.2–1.2)
Total Protein: 7.1 g/dL (ref 6.0–8.3)

## 2015-08-28 LAB — HEMOGLOBIN A1C: HEMOGLOBIN A1C: 5.9 % (ref 4.6–6.5)

## 2015-08-28 MED ORDER — NALTREXONE-BUPROPION HCL ER 8-90 MG PO TB12: ORAL_TABLET | ORAL | Status: DC

## 2015-08-28 MED ORDER — LORATADINE 10 MG PO TABS: 10.0000 mg | ORAL_TABLET | Freq: Every day | ORAL | Status: DC

## 2015-08-28 MED ORDER — TRIAMCINOLONE ACETONIDE 0.5 % EX CREA: 1.0000 "application " | TOPICAL_CREAM | Freq: Three times a day (TID) | CUTANEOUS | Status: DC

## 2015-08-28 NOTE — Patient Instructions (Signed)
Titrate up Contrave as we discussed

## 2015-08-28 NOTE — Progress Notes (Signed)
Pre visit review using our clinic review tool, if applicable. No additional management support is needed unless otherwise documented below in the visit note. 

## 2015-08-28 NOTE — Assessment & Plan Note (Signed)
A1c Wt loss 

## 2015-08-28 NOTE — Assessment & Plan Note (Signed)
9/16 Contrave Potential benefits of a long term Contrave use as well as potential risks  and complications were explained to the patient and were aknowledged.

## 2015-08-28 NOTE — Progress Notes (Signed)
Subjective:  Patient ID: [name], female    DOB: 05-20-1956  Age: 59 y.o. MRN: [id number]  CC: No chief complaint on file.   HPI DEYANNA MCTIER presents for HTN f/u  Outpatient Prescriptions Prior to Visit  Medication Sig Dispense Refill  . carvedilol (COREG) 25 MG tablet Take 1 tablet (25 mg total) by mouth 2 (two) times daily with a meal. 180 tablet 3  . Cholecalciferol 1000 UNITS tablet Take 1,000 Units by mouth daily.     . fish oil-omega-3 fatty acids 1000 MG capsule Take 1 g by mouth daily.     . furosemide (LASIX) 20 MG tablet TAKE 1 TABLET BY MOUTH EVERY DAY AS NEEDED FOR EDEMA 90 tablet 3  . loratadine (CLARITIN) 10 MG tablet Take 1 tablet (10 mg total) by mouth daily. 100 tablet 3  . triamcinolone cream (KENALOG) 0.5 % Apply 1 application topically 3 (three) times daily. 400 g 1   No facility-administered medications prior to visit.    ROS Review of Systems  Constitutional: Positive for unexpected weight change. Negative for chills, activity change, appetite change and fatigue.  HENT: Negative for congestion, mouth sores and sinus pressure.   Eyes: Negative for visual disturbance.  Respiratory: Negative for cough and chest tightness.   Gastrointestinal: Negative for nausea and abdominal pain.  Genitourinary: Negative for frequency, difficulty urinating and vaginal pain.  Musculoskeletal: Negative for back pain and gait problem.  Skin: Negative for pallor and rash.  Neurological: Negative for dizziness, tremors, weakness, numbness and headaches.  Psychiatric/Behavioral: Negative for suicidal ideas, confusion and sleep disturbance. The patient is not nervous/anxious.     Objective:  BP 138/76 mmHg  Pulse 72  Wt 252 lb (114.306 kg)  SpO2 96%  BP Readings from Last 3 Encounters:  08/28/15 138/76  02/24/15 124/70  08/26/14 120/80    Wt Readings from Last 3 Encounters:  08/28/15 252 lb (114.306 kg)  02/24/15 243 lb 4 oz (110.337 kg)  08/26/14 239 lb  (108.41 kg)    Physical Exam  Constitutional: She appears well-developed. No distress.  HENT:  Head: Normocephalic.  Right Ear: External ear normal.  Left Ear: External ear normal.  Nose: Nose normal.  Mouth/Throat: Oropharynx is clear and moist.  Eyes: Conjunctivae are normal. Pupils are equal, round, and reactive to light. Right eye exhibits no discharge. Left eye exhibits no discharge.  Neck: Normal range of motion. Neck supple. No JVD present. No tracheal deviation present. No thyromegaly present.  Cardiovascular: Normal rate, regular rhythm and normal heart sounds.   Pulmonary/Chest: No stridor. No respiratory distress. She has no wheezes.  Abdominal: Soft. Bowel sounds are normal. She exhibits no distension and no mass. There is no tenderness. There is no rebound and no guarding.  Musculoskeletal: She exhibits no edema or tenderness.  Lymphadenopathy:    She has no cervical adenopathy.  Neurological: She displays normal reflexes. No cranial nerve deficit. She exhibits normal muscle tone. Coordination normal.  Skin: No rash noted. No erythema.  Psychiatric: She has a normal mood and affect. Her behavior is normal. Judgment and thought content normal.    Lab Results  Component Value Date   WBC 7.7 08/22/2014   HGB 12.9 08/22/2014   HCT 38.8 08/22/2014   PLT 248.0 08/22/2014   GLUCOSE 88 08/28/2015   CHOL 181 08/28/2015   TRIG 150.0* 08/28/2015   HDL 35.60* 08/28/2015   LDLCALC 116* 08/28/2015   ALT 13 08/28/2015   AST 12 08/28/2015  NA 142 08/28/2015   K 3.9 08/28/2015   CL 105 08/28/2015   CREATININE 0.96 08/28/2015   BUN 15 08/28/2015   CO2 32 08/28/2015   TSH 0.90 08/28/2015   INR 1.09 07/13/2013   HGBA1C 5.9 08/28/2015    Dg Chest 2 View  07/13/2013   *RADIOLOGY REPORT*  Clinical Data: Preop for right hip replacement.  History of cardiac surgery.  CHEST - 2 VIEW  Comparison: 01/25/2010  Findings: The atrial septal defect closure device is unchanged. The  cardiac silhouette is mildly enlarged.  The mediastinum is normal in contour.  There are no hilar masses.  The lungs are clear.  No pleural effusion or pneumothorax.  The bony thorax is demineralized but intact.  IMPRESSION: No acute cardiopulmonary disease.   Original Report Authenticated By: Lajean Manes, M.D.    Assessment & Plan:   Diagnoses and all orders for this visit:  Obesity  Hyperglycemia  Need for prophylactic vaccination and inoculation against influenza -     Flu Vaccine QUAD 36+ mos IM  Other orders -     loratadine (CLARITIN) 10 MG tablet; Take 1 tablet (10 mg total) by mouth daily. -     triamcinolone cream (KENALOG) 0.5 %; Apply 1 application topically 3 (three) times daily. -     Naltrexone-Bupropion HCl ER (CONTRAVE) 8-90 MG TB12; 2 tabs bid   I am having Ms. Desaulniers start on Naltrexone-Bupropion HCl ER. I am also having her maintain her fish oil-omega-3 fatty acids, Cholecalciferol, carvedilol, furosemide, loratadine, and triamcinolone cream.  Meds ordered this encounter  Medications  . loratadine (CLARITIN) 10 MG tablet    Sig: Take 1 tablet (10 mg total) by mouth daily.    Dispense:  100 tablet    Refill:  3  . triamcinolone cream (KENALOG) 0.5 %    Sig: Apply 1 application topically 3 (three) times daily.    Dispense:  120 g    Refill:  3  . Naltrexone-Bupropion HCl ER (CONTRAVE) 8-90 MG TB12    Sig: 2 tabs bid    Dispense:  120 tablet    Refill:  3     Follow-up: Return in about 3 months (around 11/27/2015) for a follow-up visit.  Walker Kehr, MD

## 2015-08-31 ENCOUNTER — Encounter: Payer: Self-pay | Admitting: Internal Medicine

## 2015-08-31 NOTE — Assessment & Plan Note (Signed)
Coreg, Furosemide

## 2015-11-27 ENCOUNTER — Ambulatory Visit: Payer: PRIVATE HEALTH INSURANCE | Admitting: Internal Medicine

## 2016-01-04 ENCOUNTER — Telehealth: Payer: Self-pay | Admitting: Family

## 2016-01-04 ENCOUNTER — Encounter: Payer: Self-pay | Admitting: Family

## 2016-01-04 ENCOUNTER — Ambulatory Visit (INDEPENDENT_AMBULATORY_CARE_PROVIDER_SITE_OTHER): Payer: PRIVATE HEALTH INSURANCE | Admitting: Family

## 2016-01-04 ENCOUNTER — Other Ambulatory Visit (INDEPENDENT_AMBULATORY_CARE_PROVIDER_SITE_OTHER): Payer: PRIVATE HEALTH INSURANCE

## 2016-01-04 DIAGNOSIS — R0789 Other chest pain: Secondary | ICD-10-CM

## 2016-01-04 LAB — CBC
HCT: 39.6 % (ref 36.0–46.0)
HEMOGLOBIN: 13 g/dL (ref 12.0–15.0)
MCHC: 32.8 g/dL (ref 30.0–36.0)
MCV: 91.2 fl (ref 78.0–100.0)
Platelets: 212 10*3/uL (ref 150.0–400.0)
RBC: 4.34 Mil/uL (ref 3.87–5.11)
RDW: 13.5 % (ref 11.5–15.5)
WBC: 7.3 10*3/uL (ref 4.0–10.5)

## 2016-01-04 LAB — D-DIMER, QUANTITATIVE (NOT AT ARMC): D DIMER QUANT: 0.4 ug{FEU}/mL (ref 0.00–0.48)

## 2016-01-04 NOTE — Telephone Encounter (Signed)
Please inform patient that her white/red blood cells and d-dimer  to evaluate for blood clots are all within normal limits. Therefore this is most likely related to stress and anxiety as we discussed. Follow-up if symptoms worsen.

## 2016-01-04 NOTE — Assessment & Plan Note (Signed)
In office ECG reveals normal sinus rhythm. Chest tightness on likely related to cardiac origin but instead stress and anxiety given recent death of her uncle and anniversary of her son's death as well. Given mild calf tenderness obtain CBC and d-dimer. Information provided regarding stress management and grieving. Instructed to follow-up if symptoms worsen or fail to improve.

## 2016-01-04 NOTE — Progress Notes (Signed)
Subjective:    Patient ID: [name], female    DOB: October 17, 1956, 60 y.o.   MRN: [id number]  Chief Complaint  Patient presents with  . Chest Pain    states that she has had tightness on the left side of her chest x4 days had a death in the family on 03/19/23    HPI:  [name] is a 60 y.o. female who  has a past medical history of Allergic rhinitis; HTN (hypertension); Osteoarthritis; GERD (gastroesophageal reflux disease); and Abnormal glucose (2009). and presents today for an acute office visit.   This is a new problem. Associated symptom of chest tightness located on the left side of her chest has been going on for approximately 4 days. Notes most recent death of a family member at the onset of symptoms. Described like something is squeezing her heart. Does have some shortness of breath only when going up the stairs. Denies paroxysmal nocturnal dyspnea. Has stayed about the same since initial onset.     Allergies  Allergen Reactions  . Hydrochlorothiazide     REACTION: cramps  . Phentermine Hcl     REACTION: dizzy  . Valsartan     REACTION: side effect  . Verapamil     REACTION: ? lightheaded     Current Outpatient Prescriptions on File Prior to Visit  Medication Sig Dispense Refill  . carvedilol (COREG) 25 MG tablet Take 1 tablet (25 mg total) by mouth 2 (two) times daily with a meal. 180 tablet 3  . Cholecalciferol 1000 UNITS tablet Take 1,000 Units by mouth daily.     . fish oil-omega-3 fatty acids 1000 MG capsule Take 1 g by mouth daily.     . furosemide (LASIX) 20 MG tablet TAKE 1 TABLET BY MOUTH EVERY DAY AS NEEDED FOR EDEMA 90 tablet 3  . loratadine (CLARITIN) 10 MG tablet Take 1 tablet (10 mg total) by mouth daily. 100 tablet 3  . Naltrexone-Bupropion HCl ER (CONTRAVE) 8-90 MG TB12 2 tabs bid 120 tablet 3  . triamcinolone cream (KENALOG) 0.5 % Apply 1 application topically 3 (three) times daily. 120 g 3   No current facility-administered medications on file  prior to visit.    Past Medical History  Diagnosis Date  . Allergic rhinitis   . HTN (hypertension)   . Osteoarthritis     Right Hip - SEVERE  . GERD (gastroesophageal reflux disease)   . Abnormal glucose 2009    Review of Systems  Constitutional: Negative for fever, chills and diaphoresis.  Respiratory: Positive for chest tightness. Negative for shortness of breath.   Cardiovascular: Negative for chest pain, palpitations and leg swelling.  Neurological: Negative for dizziness and weakness.      Objective:    BP 142/72 mmHg  Pulse 71  Temp(Src) 98.3 F (36.8 C) (Oral)  Resp 18  Ht 5\' 4"  (1.626 m)  Wt 256 lb (116.121 kg)  BMI 43.92 kg/m2  SpO2 97% Nursing note and vital signs reviewed.   Physical Exam  Constitutional: She is oriented to person, place, and time. She appears well-developed and well-nourished. No distress.  Cardiovascular: Normal rate, regular rhythm, normal heart sounds and intact distal pulses.   Pulmonary/Chest: Effort normal and breath sounds normal.  Musculoskeletal:  No obvious swelling, deformity or discoloration with mild right sided calf tenderness.  Neurological: She is alert and oriented to person, place, and time.  Skin: Skin is warm and dry.  Psychiatric: She has a normal mood and  affect. Her behavior is normal. Judgment and thought content normal.       Assessment & Plan:   Problem List Items Addressed This Visit      Other   Chest tightness or pressure    In office ECG reveals normal sinus rhythm. Chest tightness on likely related to cardiac origin but instead stress and anxiety given recent death of her uncle and anniversary of her son's death as well. Given mild calf tenderness obtain CBC and d-dimer. Information provided regarding stress management and grieving. Instructed to follow-up if symptoms worsen or fail to improve.      Relevant Orders   EKG 12-Lead (Completed)   D-Dimer, Quantitative (Completed)   CBC (Completed)

## 2016-01-04 NOTE — Progress Notes (Signed)
Pre visit review using our clinic review tool, if applicable. No additional management support is needed unless otherwise documented below in the visit note. 

## 2016-01-04 NOTE — Patient Instructions (Signed)
Thank you for choosing [facility].  Summary/Instructions:   Please stop by the lab on the basement level of the building for your blood work. Your results will be released to [facility] (or called to you) after review, usually within 72 hours after test completion. If any changes need to be made, you will be notified at that same time.  If your symptoms worsen or fail to improve, please contact our office for further instruction, or in case of emergency go directly to the emergency room at the closest medical facility.   Stress and Stress Management Stress is a normal reaction to life events. It is what you feel when life demands more than you are used to or more than you can handle. Some stress can be useful. For example, the stress reaction can help you catch the last bus of the day, study for a test, or meet a deadline at work. But stress that occurs too often or for too long can cause problems. It can affect your emotional health and interfere with relationships and normal daily activities. Too much stress can weaken your immune system and increase your risk for physical illness. If you already have a medical problem, stress can make it worse. CAUSES  All sorts of life events may cause stress. An event that causes stress for one person may not be stressful for another person. Major life events commonly cause stress. These may be positive or negative. Examples include losing your job, moving into a new home, getting married, having a baby, or losing a loved one. Less obvious life events may also cause stress, especially if they occur day after day or in combination. Examples include working long hours, driving in traffic, caring for children, being in debt, or being in a difficult relationship. SIGNS AND SYMPTOMS Stress may cause emotional symptoms including, the following:  Anxiety. This is feeling worried, afraid, on edge, overwhelmed, or out of control.  Anger. This is feeling irritated  or impatient.  Depression. This is feeling sad, down, helpless, or guilty.  Difficulty focusing, remembering, or making decisions. Stress may cause physical symptoms, including the following:   Aches and pains. These may affect your head, neck, back, stomach, or other areas of your body.  Tight muscles or clenched jaw.  Low energy or trouble sleeping. Stress may cause unhealthy behaviors, including the following:   Eating to feel better (overeating) or skipping meals.  Sleeping too little, too much, or both.  Working too much or putting off tasks (procrastination).  Smoking, drinking alcohol, or using drugs to feel better. DIAGNOSIS  Stress is diagnosed through an assessment by your health care provider. Your health care provider will ask questions about your symptoms and any stressful life events.Your health care provider will also ask about your medical history and may order blood tests or other tests. Certain medical conditions and medicine can cause physical symptoms similar to stress. Mental illness can cause emotional symptoms and unhealthy behaviors similar to stress. Your health care provider may refer you to a mental health professional for further evaluation.  TREATMENT  Stress management is the recommended treatment for stress.The goals of stress management are reducing stressful life events and coping with stress in healthy ways.  Techniques for reducing stressful life events include the following:  Stress identification. Self-monitor for stress and identify what causes stress for you. These skills may help you to avoid some stressful events.  Time management. Set your priorities, keep a calendar of events, and learn to  say "no." These tools can help you avoid making too many commitments. Techniques for coping with stress include the following:  Rethinking the problem. Try to think realistically about stressful events rather than ignoring them or overreacting. Try to  find the positives in a stressful situation rather than focusing on the negatives.  Exercise. Physical exercise can release both physical and emotional tension. The key is to find a form of exercise you enjoy and do it regularly.  Relaxation techniques. These relax the body and mind. Examples include yoga, meditation, tai chi, biofeedback, deep breathing, progressive muscle relaxation, listening to music, being out in nature, journaling, and other hobbies. Again, the key is to find one or more that you enjoy and can do regularly.  Healthy lifestyle. Eat a balanced diet, get plenty of sleep, and do not smoke. Avoid using alcohol or drugs to relax.  Strong support network. Spend time with family, friends, or other people you enjoy being around.Express your feelings and talk things over with someone you trust. Counseling or talktherapy with a mental health professional may be helpful if you are having difficulty managing stress on your own. Medicine is typically not recommended for the treatment of stress.Talk to your health care provider if you think you need medicine for symptoms of stress. HOME CARE INSTRUCTIONS  Keep all follow-up visits as directed by your health care provider.  Take all medicines as directed by your health care provider. SEEK MEDICAL CARE IF:  Your symptoms get worse or you start having new symptoms.  You feel overwhelmed by your problems and can no longer manage them on your own. SEEK IMMEDIATE MEDICAL CARE IF:  You feel like hurting yourself or someone else.   This information is not intended to replace advice given to you by your health care provider. Make sure you discuss any questions you have with your health care provider.   Document Released: 05/21/2001 Document Revised: 12/16/2014 Document Reviewed: 07/20/2013 Elsevier Interactive Patient Education [employer].

## 2016-01-08 NOTE — Telephone Encounter (Signed)
Pt aware of results 

## 2016-02-26 ENCOUNTER — Other Ambulatory Visit: Payer: Self-pay | Admitting: Internal Medicine

## 2016-03-12 ENCOUNTER — Other Ambulatory Visit: Payer: Self-pay | Admitting: Obstetrics and Gynecology

## 2016-03-13 LAB — CYTOLOGY - PAP

## 2016-03-20 ENCOUNTER — Other Ambulatory Visit: Payer: Self-pay | Admitting: Obstetrics and Gynecology

## 2016-03-25 NOTE — Patient Instructions (Addendum)
Your procedure is scheduled on:  Thursday, April 04, 2016  Enter through the [employer] [facility] at:  10:15 AM  Pick up the phone at the desk and dial [phone].  Call this number if you have problems the morning of surgery: [phone].  Remember: Do NOT eat food or drink after:  Midnight Wednesday, April 03, 2016  Take these medicines the morning of surgery with a SIP OF WATER:  Carvedilol  Stop taking fish oil at this time  Do NOT wear jewelry (body piercing), metal hair clips/bobby pins, make-up, or nail polish. Do NOT wear lotions, powders, or perfumes.  You may wear deodorant. Do NOT shave for 48 hours prior to surgery. Do NOT bring valuables to the hospital. Contacts, dentures, or bridgework may not be worn into surgery.  Have a responsible adult drive you home and stay with you for 24 hours after your procedure

## 2016-03-26 ENCOUNTER — Encounter (HOSPITAL_COMMUNITY)
Admission: RE | Admit: 2016-03-26 | Discharge: 2016-03-26 | Disposition: A | Discharge status: Home / Self Care | Payer: PRIVATE HEALTH INSURANCE | Source: Ambulatory Visit | Attending: Obstetrics and Gynecology | Admitting: Obstetrics and Gynecology

## 2016-03-26 ENCOUNTER — Encounter (HOSPITAL_COMMUNITY): Payer: Self-pay

## 2016-03-26 DIAGNOSIS — Z01812 Encounter for preprocedural laboratory examination: Secondary | ICD-10-CM | POA: Diagnosis not present

## 2016-03-26 DIAGNOSIS — M1611 Unilateral primary osteoarthritis, right hip: Secondary | ICD-10-CM | POA: Diagnosis not present

## 2016-03-26 DIAGNOSIS — N840 Polyp of corpus uteri: Secondary | ICD-10-CM | POA: Diagnosis not present

## 2016-03-26 DIAGNOSIS — K219 Gastro-esophageal reflux disease without esophagitis: Secondary | ICD-10-CM | POA: Diagnosis not present

## 2016-03-26 DIAGNOSIS — I10 Essential (primary) hypertension: Secondary | ICD-10-CM | POA: Insufficient documentation

## 2016-03-26 HISTORY — DX: Headache, unspecified: R51.9

## 2016-03-26 HISTORY — DX: Headache: R51

## 2016-03-26 LAB — BASIC METABOLIC PANEL
Anion gap: 4 — ABNORMAL LOW (ref 5–15)
BUN: 17 mg/dL (ref 6–20)
CALCIUM: 9.4 mg/dL (ref 8.9–10.3)
CHLORIDE: 109 mmol/L (ref 101–111)
CO2: 30 mmol/L (ref 22–32)
CREATININE: 0.84 mg/dL (ref 0.44–1.00)
GFR calc non Af Amer: >60 mL/min (ref >60)
Glucose, Bld: 99 mg/dL (ref 65–99)
Potassium: 3.8 mmol/L (ref 3.5–5.1)
SODIUM: 143 mmol/L (ref 135–145)

## 2016-03-26 LAB — CBC
HEMATOCRIT: 39.3 % (ref 36.0–46.0)
HEMOGLOBIN: 12.9 g/dL (ref 12.0–15.0)
MCH: 30.3 pg (ref 26.0–34.0)
MCHC: 32.8 g/dL (ref 30.0–36.0)
MCV: 92.3 fL (ref 78.0–100.0)
Platelets: 216 10*3/uL (ref 150–400)
RBC: 4.26 MIL/uL (ref 3.87–5.11)
RDW: 13.4 % (ref 11.5–15.5)
WBC: 7 10*3/uL (ref 4.0–10.5)

## 2016-04-04 ENCOUNTER — Encounter (HOSPITAL_COMMUNITY): Payer: Self-pay | Admitting: Registered Nurse

## 2016-04-04 ENCOUNTER — Encounter (HOSPITAL_COMMUNITY)
Admission: RE | Disposition: A | Discharge status: Home / Self Care | Payer: Self-pay | Source: Ambulatory Visit | Attending: Obstetrics and Gynecology

## 2016-04-04 ENCOUNTER — Ambulatory Visit (HOSPITAL_COMMUNITY): Payer: PRIVATE HEALTH INSURANCE | Admitting: Anesthesiology

## 2016-04-04 ENCOUNTER — Ambulatory Visit (HOSPITAL_COMMUNITY)
Admission: RE | Admit: 2016-04-04 | Discharge: 2016-04-04 | Disposition: A | Discharge status: Home / Self Care | Payer: PRIVATE HEALTH INSURANCE | Source: Ambulatory Visit | Attending: Obstetrics and Gynecology | Admitting: Obstetrics and Gynecology

## 2016-04-04 DIAGNOSIS — G43909 Migraine, unspecified, not intractable, without status migrainosus: Secondary | ICD-10-CM | POA: Diagnosis not present

## 2016-04-04 DIAGNOSIS — I10 Essential (primary) hypertension: Secondary | ICD-10-CM | POA: Diagnosis not present

## 2016-04-04 DIAGNOSIS — J45909 Unspecified asthma, uncomplicated: Secondary | ICD-10-CM | POA: Insufficient documentation

## 2016-04-04 DIAGNOSIS — Z87891 Personal history of nicotine dependence: Secondary | ICD-10-CM | POA: Insufficient documentation

## 2016-04-04 DIAGNOSIS — M1611 Unilateral primary osteoarthritis, right hip: Secondary | ICD-10-CM | POA: Diagnosis not present

## 2016-04-04 DIAGNOSIS — N840 Polyp of corpus uteri: Secondary | ICD-10-CM | POA: Insufficient documentation

## 2016-04-04 DIAGNOSIS — Z780 Asymptomatic menopausal state: Secondary | ICD-10-CM | POA: Diagnosis not present

## 2016-04-04 DIAGNOSIS — K219 Gastro-esophageal reflux disease without esophagitis: Secondary | ICD-10-CM | POA: Diagnosis not present

## 2016-04-04 DIAGNOSIS — Z888 Allergy status to other drugs, medicaments and biological substances status: Secondary | ICD-10-CM | POA: Diagnosis not present

## 2016-04-04 HISTORY — PX: HYSTEROSCOPY WITH D & C: SHX1775

## 2016-04-04 SURGERY — DILATATION AND CURETTAGE /HYSTEROSCOPY
Anesthesia: General | Site: Vagina

## 2016-04-04 MED ORDER — FENTANYL CITRATE (PF) 100 MCG/2ML IJ SOLN
INTRAMUSCULAR | Status: AC
  Filled 2016-04-04: qty 2

## 2016-04-04 MED ORDER — SCOPOLAMINE 1 MG/3DAYS TD PT72
MEDICATED_PATCH | TRANSDERMAL | Status: AC
  Administered 2016-04-04: 1.5 mg via TRANSDERMAL
  Filled 2016-04-04: qty 1

## 2016-04-04 MED ORDER — LACTATED RINGERS IV SOLN
INTRAVENOUS | Status: DC
  Administered 2016-04-04 (×2): via INTRAVENOUS

## 2016-04-04 MED ORDER — PROPOFOL 10 MG/ML IV BOLUS
INTRAVENOUS | Status: AC
  Filled 2016-04-04: qty 20

## 2016-04-04 MED ORDER — PROPOFOL 10 MG/ML IV BOLUS
INTRAVENOUS | Status: DC | PRN
  Administered 2016-04-04: 200 mg via INTRAVENOUS
  Administered 2016-04-04: 100 mg via INTRAVENOUS

## 2016-04-04 MED ORDER — SODIUM CHLORIDE 0.9 % IR SOLN
Status: DC | PRN
  Administered 2016-04-04: 3000 mL

## 2016-04-04 MED ORDER — DEXAMETHASONE SODIUM PHOSPHATE 4 MG/ML IJ SOLN
INTRAMUSCULAR | Status: DC | PRN
  Administered 2016-04-04: 4 mg via INTRAVENOUS

## 2016-04-04 MED ORDER — CEFAZOLIN SODIUM-DEXTROSE 2-4 GM/100ML-% IV SOLN
2.0000 g | INTRAVENOUS | Status: AC
  Administered 2016-04-04: 2 g via INTRAVENOUS
  Filled 2016-04-04: qty 100

## 2016-04-04 MED ORDER — MIDAZOLAM HCL 2 MG/2ML IJ SOLN
INTRAMUSCULAR | Status: AC
  Filled 2016-04-04: qty 2

## 2016-04-04 MED ORDER — KETOROLAC TROMETHAMINE 30 MG/ML IJ SOLN
INTRAMUSCULAR | Status: DC | PRN
  Administered 2016-04-04: 30 mg via INTRAVENOUS

## 2016-04-04 MED ORDER — SUCCINYLCHOLINE CHLORIDE 20 MG/ML IJ SOLN
INTRAMUSCULAR | Status: AC
  Filled 2016-04-04: qty 1

## 2016-04-04 MED ORDER — MIDAZOLAM HCL 5 MG/5ML IJ SOLN
INTRAMUSCULAR | Status: DC | PRN
  Administered 2016-04-04: 2 mg via INTRAVENOUS

## 2016-04-04 MED ORDER — FENTANYL CITRATE (PF) 100 MCG/2ML IJ SOLN
INTRAMUSCULAR | Status: DC | PRN
  Administered 2016-04-04: 50 ug via INTRAVENOUS

## 2016-04-04 MED ORDER — SCOPOLAMINE 1 MG/3DAYS TD PT72
1.0000 | MEDICATED_PATCH | Freq: Once | TRANSDERMAL | Status: DC
  Administered 2016-04-04: 1.5 mg via TRANSDERMAL

## 2016-04-04 MED ORDER — LIDOCAINE HCL (CARDIAC) 20 MG/ML IV SOLN
INTRAVENOUS | Status: DC | PRN
  Administered 2016-04-04: 100 mg via INTRAVENOUS

## 2016-04-04 MED ORDER — SUCCINYLCHOLINE CHLORIDE 20 MG/ML IJ SOLN
INTRAMUSCULAR | Status: DC | PRN
  Administered 2016-04-04: 120 mg via INTRAVENOUS

## 2016-04-04 MED ORDER — LIDOCAINE HCL 1 % IJ SOLN
INTRAMUSCULAR | Status: AC
  Filled 2016-04-04: qty 20

## 2016-04-04 MED ORDER — ONDANSETRON HCL 4 MG/2ML IJ SOLN
INTRAMUSCULAR | Status: DC | PRN
  Administered 2016-04-04: 4 mg via INTRAVENOUS

## 2016-04-04 MED ORDER — LIDOCAINE HCL (CARDIAC) 20 MG/ML IV SOLN
INTRAVENOUS | Status: AC
  Filled 2016-04-04: qty 5

## 2016-04-04 MED ORDER — ONDANSETRON HCL 4 MG/2ML IJ SOLN
INTRAMUSCULAR | Status: AC
  Filled 2016-04-04: qty 2

## 2016-04-04 MED ORDER — DEXAMETHASONE SODIUM PHOSPHATE 4 MG/ML IJ SOLN
INTRAMUSCULAR | Status: AC
  Filled 2016-04-04: qty 1

## 2016-04-04 MED ORDER — KETOROLAC TROMETHAMINE 30 MG/ML IJ SOLN
INTRAMUSCULAR | Status: AC
  Filled 2016-04-04: qty 1

## 2016-04-04 MED ORDER — LIDOCAINE HCL 1 % IJ SOLN
INTRAMUSCULAR | Status: DC | PRN
  Administered 2016-04-04: 10 mL

## 2016-04-04 MED ORDER — CEFAZOLIN SODIUM-DEXTROSE 2-3 GM-% IV SOLR
INTRAVENOUS | Status: AC
  Filled 2016-04-04: qty 50

## 2016-04-04 SURGICAL SUPPLY — 20 items
CANISTER SUCT 3000ML (MISCELLANEOUS) ×3 IMPLANT
CATH ROBINSON RED A/P 16FR (CATHETERS) ×3 IMPLANT
CLOTH BEACON ORANGE TIMEOUT ST (SAFETY) ×3 IMPLANT
CONTAINER PREFILL 10% NBF 60ML (FORM) ×6 IMPLANT
DEVICE MYOSURE REACH (MISCELLANEOUS) ×3 IMPLANT
ELECT REM PT RETURN 9FT ADLT (ELECTROSURGICAL) ×3
ELECTRODE REM PT RTRN 9FT ADLT (ELECTROSURGICAL) ×1 IMPLANT
GLOVE BIOGEL PI IND STRL 7.0 (GLOVE) ×1 IMPLANT
GLOVE BIOGEL PI INDICATOR 7.0 (GLOVE) ×2
GLOVE ECLIPSE 7.0 STRL STRAW (GLOVE) ×6 IMPLANT
GOWN STRL REUS W/TWL LRG LVL3 (GOWN DISPOSABLE) ×9 IMPLANT
LOOP ANGLED CUTTING 22FR (CUTTING LOOP) IMPLANT
PACK HYSTEROSCOPY LF (CUSTOM PROCEDURE TRAY) ×3 IMPLANT
PACK VAGINAL MINOR WOMEN LF (CUSTOM PROCEDURE TRAY) ×3 IMPLANT
PAD OB MATERNITY 4.3X12.25 (PERSONAL CARE ITEMS) ×3 IMPLANT
SEAL ROD LENS SCOPE MYOSURE (ABLATOR) ×3 IMPLANT
TOWEL OR 17X24 6PK STRL BLUE (TOWEL DISPOSABLE) ×6 IMPLANT
TUBING AQUILEX INFLOW (TUBING) ×3 IMPLANT
TUBING AQUILEX OUTFLOW (TUBING) ×3 IMPLANT
WATER STERILE IRR 1000ML POUR (IV SOLUTION) ×3 IMPLANT

## 2016-04-04 NOTE — Transfer of Care (Signed)
Immediate Anesthesia Transfer of Care Note  Patient: [name]  Procedure(s) Performed: Procedure(s): DILATATION AND CURETTAGE /HYSTEROSCOPY (N/A)  Patient Location: PACU  Anesthesia Type:General  Level of Consciousness: awake, alert  and oriented  Airway & Oxygen Therapy: Patient Spontanous Breathing and Patient connected to face mask oxygen  Post-op Assessment: Report given to RN  Post vital signs: Reviewed  Last Vitals:  Filed Vitals:   04/04/16 0953  BP: 134/69  Pulse: 74  Temp: 36.8 C  Resp: 20    Last Pain: There were no vitals filed for this visit.    Patients Stated Pain Goal: 3 ([id number])  Complications: No apparent anesthesia complications

## 2016-04-04 NOTE — Discharge Instructions (Signed)

## 2016-04-04 NOTE — Anesthesia Postprocedure Evaluation (Signed)
Anesthesia Post Note  Patient: [name]  Procedure(s) Performed: Procedure(s) (LRB): DILATATION AND CURETTAGE /HYSTEROSCOPY, POLYPECTOMY WITH MYOSURE (N/A)  Patient location during evaluation: PACU Anesthesia Type: General Level of consciousness: awake and alert Pain management: pain level controlled Vital Signs Assessment: post-procedure vital signs reviewed and stable Respiratory status: spontaneous breathing, nonlabored ventilation, respiratory function stable and patient connected to nasal cannula oxygen Cardiovascular status: blood pressure returned to baseline and stable Postop Assessment: no signs of nausea or vomiting Anesthetic complications: no     Last Vitals:  Filed Vitals:   04/04/16 1427 04/04/16 1430  BP:  134/70  Pulse: 63 71  Temp: 36.7 C   Resp: 17 18    Last Pain: There were no vitals filed for this visit. Pain Goal: Patients Stated Pain Goal: 3 (04/04/16 0953)               Effie Berkshire

## 2016-04-04 NOTE — H&P (Signed)
[name] is an 60 y.o. black female who presents to the OR for a hysteroscopy/D&C for a thickened endometrium . Pt had a large endocervical polyp removed. There was a long stalk which went up into the ut cavity. She then had an u/s which indicated that her lining was thickened. Chief Complaint: HPI:  Past Medical History  Diagnosis Date  . Allergic rhinitis   . HTN (hypertension)   . Osteoarthritis     Right Hip - SEVERE  . GERD (gastroesophageal reflux disease)   . Abnormal glucose 2009  . Headache     Migraines (rare)    Past Surgical History  Procedure Laterality Date  . 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 Cholecystectomy    . Knee arthroscopy      left   . 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 Colonoscopy    . Wisdom tooth extraction    . Tubal ligation      Family History  Problem Relation Age of Onset  . Hypertension    . Parkinsonism Mother   . Hypertension Mother    Social History:  reports that she quit smoking about 4 years ago. Her smoking use included Cigarettes. She has a 20 pack-year smoking history. She has never used smokeless tobacco. She reports that she drinks about 4.2 oz of alcohol per week. She reports that she does not use illicit drugs.  Allergies:  Allergies  Allergen Reactions  . Hydrochlorothiazide     REACTION: cramps  . Phentermine Hcl     REACTION: dizzy  . Valsartan     REACTION: side effect- lightheadedness  . Verapamil     REACTION: ? lightheaded    Medications Prior to Admission  Medication Sig Dispense Refill  . carvedilol (COREG) 25 MG tablet TAKE 1 TABLET BY MOUTH TWICE A DAY WITH A MEAL 180 tablet 1  . furosemide (LASIX) 20 MG tablet TAKE 1 TABLET BY MOUTH EVERY DAY AS NEEDED FOR EDEMA 90 tablet 3  . loratadine (CLARITIN) 10 MG tablet Take 1 tablet (10 mg  total) by mouth daily. 100 tablet 3  . triamcinolone cream (KENALOG) 0.5 % Apply 1 application topically 3 (three) times daily. 120 g 3       Blood pressure 134/69, pulse 74, temperature 98.3 F (36.8 C), temperature source Oral, resp. rate 20, SpO2 98 %. General appearance: alert and cooperative Lungs: clear to auscultation bilaterally Abdomen: soft, non-tender; bowel sounds normal; no masses,  no organomegaly   Lab Results  Component Value Date   WBC 7.0 03/26/2016   HGB 12.9 03/26/2016   HCT 39.3 03/26/2016   MCV 92.3 03/26/2016   PLT 216 03/26/2016   No results found for: PREGTESTUR, PREGSERUM, HCG, HCGQUANT   IMP/ Abnl endometrium in post menopausal female Plan/ Hysteroscopy and D&C

## 2016-04-04 NOTE — Anesthesia Preprocedure Evaluation (Addendum)
Anesthesia Evaluation  Patient identified by MRN, date of birth, ID band Patient awake    Reviewed: Allergy & Precautions, NPO status , Patient's Chart, lab work & pertinent test results, reviewed documented beta blocker date and time   Airway Mallampati: III  TM Distance: >3 FB Neck ROM: Full    Dental  (+) Teeth Intact   Pulmonary asthma , former smoker,    breath sounds clear to auscultation       Cardiovascular hypertension, Pt. on medications and Pt. on home beta blockers  Rhythm:Regular Rate:Normal     Neuro/Psych  Headaches,  Neuromuscular disease negative psych ROS   GI/Hepatic Neg liver ROS, GERD  Controlled,  Endo/Other  negative endocrine ROS  Renal/GU negative Renal ROS  negative genitourinary   Musculoskeletal  (+) Arthritis ,   Abdominal   Peds negative pediatric ROS (+)  Hematology negative hematology ROS (+)   Anesthesia Other Findings   Reproductive/Obstetrics negative OB ROS                            Lab Results  Component Value Date   WBC 7.0 03/26/2016   HGB 12.9 03/26/2016   HCT 39.3 03/26/2016   MCV 92.3 03/26/2016   PLT 216 03/26/2016   Lab Results  Component Value Date   CREATININE 0.84 03/26/2016   BUN 17 03/26/2016   NA 143 03/26/2016   K 3.8 03/26/2016   CL 109 03/26/2016   CO2 30 03/26/2016     Anesthesia Physical Anesthesia Plan  ASA: III  Anesthesia Plan: General   Post-op Pain Management:    Induction: Intravenous  Airway Management Planned: LMA  Additional Equipment:   Intra-op Plan:   Post-operative Plan: Extubation in OR  Informed Consent: I have reviewed the patients History and Physical, chart, labs and discussed the procedure including the risks, benefits and alternatives for the proposed anesthesia with the patient or authorized representative who has indicated his/her understanding and acceptance.   Dental advisory  given  Plan Discussed with: CRNA  Anesthesia Plan Comments:         Anesthesia Quick Evaluation

## 2016-04-04 NOTE — Anesthesia Procedure Notes (Addendum)
Procedure Name: LMA Insertion Date/Time: 04/04/2016 12:19 PM Performed by: Talbot Grumbling Pre-anesthesia Checklist: Patient identified, Emergency Drugs available, Suction available and Patient being monitored Patient Re-evaluated:Patient Re-evaluated prior to inductionOxygen Delivery Method: Circle system utilized Preoxygenation: Pre-oxygenation with 100% oxygen Intubation Type: IV induction Ventilation: Mask ventilation without difficulty LMA: LMA inserted LMA Size: 4.0 Number of attempts: 1 Placement Confirmation: positive ETCO2 and breath sounds checked- equal and bilateral Tube secured with: Tape Dental Injury: Teeth and Oropharynx as per pre-operative assessment    Procedure Name: Intubation Date/Time: 04/04/2016 12:34 PM Performed by: Talbot Grumbling Pre-anesthesia Checklist: Patient identified, Emergency Drugs available, Suction available and Patient being monitored Intubation Type: IV induction Ventilation: Mask ventilation with difficulty Laryngoscope Size: Glidescope (attempt to visualize with Miller 3, unsuccessful) Grade View: Grade II Tube type: Oral Tube size: 7.0 mm Number of attempts: 2 Airway Equipment and Method: Stylet Placement Confirmation: ETT inserted through vocal cords under direct vision,  positive ETCO2 and breath sounds checked- equal and bilateral Secured at: 21 cm Tube secured with: Tape Dental Injury: Teeth and Oropharynx as per pre-operative assessment

## 2016-04-05 ENCOUNTER — Encounter (HOSPITAL_COMMUNITY): Payer: Self-pay | Admitting: Obstetrics and Gynecology

## 2016-04-05 NOTE — Op Note (Signed)
NAME:  [name], [name] NO.:  [id number]  MEDICAL RECORD NO.:  [id number]  LOCATION:  [facility]                          FACILITY:  [facility]  PHYSICIAN:  Freda Munro, M.D.    DATE OF BIRTH:  [birth year]  DATE OF PROCEDURE:  04/04/2016 DATE OF DISCHARGE:  04/04/2016                              OPERATIVE REPORT   PREOPERATIVE DIAGNOSES: 1. Abnormal endometrial lining in a postmenopausal woman. 2. Suspected endometrial polyp.  POSTOPERATIVE DIAGNOSES: 1. Abnormal endometrial lining in a postmenopausal woman. 2. Suspected endometrial polyp.  PROCEDURE: 1. Hysteroscopy. 2. Resection of large endometrial polyp with a MyoSure. 3. Curettage.  SURGEON:  Freda Munro, MD  ANESTHESIA:  General and local.  ANTIBIOTICS:  Ancef 2 g.  DRAINS:  Red rubber catheter bladder.  SPECIMENS:  Polyp and endometrial curettings sent to Pathology.  ESTIMATED BLOOD LOSS:  Minimal.  COMPLICATIONS:  None.  PROCEDURE IN DETAIL:  The patient was taken to the operating room, where general anesthesia was administered without difficulty.  A sterile speculum was placed in the vagina, 20 mL of 1% lidocaine was used for paracervical block.  A single-tooth tenaculum was applied to the anterior cervical lip.  The cervix was serially dilated to 23-French. The hysteroscope was advanced to the endocervical canal.  On entering the uterine cavity, it appeared the patient had a large endometrial polyp arising from the patient's left cornua.  At this point, the decision was made to use the MyoSure to remove the polyp.  MyoSure scope was set up and the scope placed in the uterine cavity, not sure if MyoSure was advanced through cavity and the polyp resected in entirety. The base was then curetted with a MyoSure.  Remaining cavity revealed no other polyp or abnormalities.  Ostia were visualized, appeared to be normal.  The hysteroscope was then removed.  Sharp curettage was performed.  This concluded  the procedure.  She was taken to recovery room in stable condition.  Instrument and lap counts were correct x2.          ______________________________ Freda Munro, M.D.     MA/MEDQ  D:  04/04/2016  T:  04/05/2016  Job:  [id number]

## 2016-04-13 ENCOUNTER — Other Ambulatory Visit: Payer: Self-pay | Admitting: Internal Medicine

## 2016-06-07 ENCOUNTER — Other Ambulatory Visit: Payer: Self-pay | Admitting: *Deleted

## 2016-06-07 MED ORDER — LORATADINE 10 MG PO TABS: 10.0000 mg | ORAL_TABLET | Freq: Every day | ORAL | Status: DC

## 2016-07-12 ENCOUNTER — Other Ambulatory Visit: Payer: Self-pay | Admitting: Internal Medicine

## 2016-09-03 ENCOUNTER — Other Ambulatory Visit: Payer: Self-pay | Admitting: Internal Medicine

## 2016-11-02 ENCOUNTER — Other Ambulatory Visit: Payer: Self-pay | Admitting: Internal Medicine

## 2016-12-01 ENCOUNTER — Other Ambulatory Visit: Payer: Self-pay | Admitting: Internal Medicine

## 2016-12-10 ENCOUNTER — Other Ambulatory Visit: Payer: Self-pay | Admitting: Internal Medicine

## 2017-01-02 ENCOUNTER — Other Ambulatory Visit: Payer: Self-pay | Admitting: Internal Medicine

## 2017-01-28 ENCOUNTER — Other Ambulatory Visit: Payer: Self-pay | Admitting: Internal Medicine

## 2017-02-06 ENCOUNTER — Other Ambulatory Visit: Payer: Self-pay | Admitting: Internal Medicine

## 2017-02-12 ENCOUNTER — Other Ambulatory Visit: Payer: Self-pay | Admitting: Internal Medicine

## 2017-02-14 ENCOUNTER — Other Ambulatory Visit: Payer: Self-pay | Admitting: Internal Medicine

## 2017-02-19 ENCOUNTER — Other Ambulatory Visit: Payer: Self-pay | Admitting: Internal Medicine

## 2017-03-03 ENCOUNTER — Encounter: Payer: Self-pay | Admitting: Internal Medicine

## 2017-03-03 ENCOUNTER — Other Ambulatory Visit (INDEPENDENT_AMBULATORY_CARE_PROVIDER_SITE_OTHER): Payer: Managed Care, Other (non HMO)

## 2017-03-03 ENCOUNTER — Ambulatory Visit (INDEPENDENT_AMBULATORY_CARE_PROVIDER_SITE_OTHER): Payer: Managed Care, Other (non HMO) | Admitting: Internal Medicine

## 2017-03-03 VITALS — BP 130/72 | HR 70 | Temp 98.8°F | Resp 16 | Ht 63.75 in | Wt 267.1 lb

## 2017-03-03 DIAGNOSIS — R140 Abdominal distension (gaseous): Secondary | ICD-10-CM

## 2017-03-03 DIAGNOSIS — Z23 Encounter for immunization: Secondary | ICD-10-CM | POA: Diagnosis not present

## 2017-03-03 DIAGNOSIS — E6609 Other obesity due to excess calories: Secondary | ICD-10-CM

## 2017-03-03 DIAGNOSIS — Z6841 Body Mass Index (BMI) 40.0 and over, adult: Secondary | ICD-10-CM | POA: Diagnosis not present

## 2017-03-03 DIAGNOSIS — I10 Essential (primary) hypertension: Secondary | ICD-10-CM | POA: Diagnosis not present

## 2017-03-03 DIAGNOSIS — IMO0001 Reserved for inherently not codable concepts without codable children: Secondary | ICD-10-CM

## 2017-03-03 DIAGNOSIS — Z0000 Encounter for general adult medical examination without abnormal findings: Secondary | ICD-10-CM

## 2017-03-03 LAB — HEPATIC FUNCTION PANEL
ALT: 16 U/L (ref 0–35)
AST: 14 U/L (ref 0–37)
Albumin: 4.3 g/dL (ref 3.5–5.2)
Alkaline Phosphatase: 57 U/L (ref 39–117)
BILIRUBIN DIRECT: 0 mg/dL (ref 0.0–0.3)
Total Bilirubin: 0.4 mg/dL (ref 0.2–1.2)
Total Protein: 7.5 g/dL (ref 6.0–8.3)

## 2017-03-03 LAB — CBC WITH DIFFERENTIAL/PLATELET
BASOS PCT: 0.7 % (ref 0.0–3.0)
Basophils Absolute: 0.1 10*3/uL (ref 0.0–0.1)
EOS ABS: 0.3 10*3/uL (ref 0.0–0.7)
EOS PCT: 4.7 % (ref 0.0–5.0)
HEMATOCRIT: 39.1 % (ref 36.0–46.0)
HEMOGLOBIN: 13 g/dL (ref 12.0–15.0)
LYMPHS PCT: 23.8 % (ref 12.0–46.0)
Lymphs Abs: 1.7 10*3/uL (ref 0.7–4.0)
MCHC: 33.1 g/dL (ref 30.0–36.0)
MCV: 90.6 fl (ref 78.0–100.0)
MONOS PCT: 8.5 % (ref 3.0–12.0)
Monocytes Absolute: 0.6 10*3/uL (ref 0.1–1.0)
Neutro Abs: 4.6 10*3/uL (ref 1.4–7.7)
Neutrophils Relative %: 62.3 % (ref 43.0–77.0)
Platelets: 216 10*3/uL (ref 150.0–400.0)
RBC: 4.32 Mil/uL (ref 3.87–5.11)
RDW: 13.1 % (ref 11.5–15.5)
WBC: 7.3 10*3/uL (ref 4.0–10.5)

## 2017-03-03 LAB — URINALYSIS
Bilirubin Urine: NEGATIVE
KETONES UR: NEGATIVE
Leukocytes, UA: NEGATIVE
Nitrite: NEGATIVE
Specific Gravity, Urine: 1.02 (ref 1.000–1.030)
Total Protein, Urine: NEGATIVE
URINE GLUCOSE: NEGATIVE
UROBILINOGEN UA: 0.2 (ref 0.0–1.0)
pH: 5.5 (ref 5.0–8.0)

## 2017-03-03 LAB — BASIC METABOLIC PANEL
BUN: 13 mg/dL (ref 6–23)
CHLORIDE: 102 meq/L (ref 96–112)
CO2: 34 meq/L — AB (ref 19–32)
Calcium: 9.7 mg/dL (ref 8.4–10.5)
Creatinine, Ser: 0.85 mg/dL (ref 0.40–1.20)
GFR: 72.01 mL/min (ref >60.00)
Glucose, Bld: 96 mg/dL (ref 70–99)
Potassium: 3.6 mEq/L (ref 3.5–5.1)
SODIUM: 141 meq/L (ref 135–145)

## 2017-03-03 LAB — LIPID PANEL
CHOL/HDL RATIO: 4
Cholesterol: 162 mg/dL (ref 0–200)
HDL: 38 mg/dL — ABNORMAL LOW (ref >39.00)
LDL Cholesterol: 104 mg/dL — ABNORMAL HIGH (ref 0–99)
NonHDL: 124.3
TRIGLYCERIDES: 104 mg/dL (ref 0.0–149.0)
VLDL: 20.8 mg/dL (ref 0.0–40.0)

## 2017-03-03 LAB — TSH: TSH: 1.19 u[IU]/mL (ref 0.35–4.50)

## 2017-03-03 MED ORDER — TRIAMCINOLONE ACETONIDE 0.5 % EX CREA: 1.0000 "application " | TOPICAL_CREAM | Freq: Three times a day (TID) | CUTANEOUS | 3 refills | Status: DC

## 2017-03-03 MED ORDER — PANCRELIPASE (LIP-PROT-AMYL) 36000-114000 UNITS PO CPEP: 36000.0000 [IU] | ORAL_CAPSULE | Freq: Three times a day (TID) | ORAL | 3 refills | Status: DC

## 2017-03-03 MED ORDER — LORATADINE 10 MG PO TABS: 10.0000 mg | ORAL_TABLET | Freq: Every day | ORAL | 3 refills | Status: DC

## 2017-03-03 MED ORDER — FUROSEMIDE 20 MG PO TABS: ORAL_TABLET | ORAL | 3 refills | Status: DC

## 2017-03-03 MED ORDER — CARVEDILOL 25 MG PO TABS: ORAL_TABLET | ORAL | 11 refills | Status: DC

## 2017-03-03 NOTE — Assessment & Plan Note (Signed)
We discussed age appropriate health related issues, including available/recomended screening tests and vaccinations. We discussed a need for adhering to healthy diet and exercise. Labs/EKG were reviewed/ordered. All questions were answered.   

## 2017-03-03 NOTE — Assessment & Plan Note (Signed)
Wt Readings from Last 3 Encounters:  03/03/17 267 lb 2 oz (121.2 kg)  03/26/16 261 lb (118.4 kg)  01/04/16 256 lb (116.1 kg)

## 2017-03-03 NOTE — Addendum Note (Signed)
Addended by: Valere Dross on: 03/03/2017 11:10 AM   Modules accepted: Orders

## 2017-03-03 NOTE — Progress Notes (Signed)
Subjective:  Patient ID: [name], female    DOB: 11-08-1956  Age: 61 y.o. MRN: [id number]  CC: Annual Exam (SOB, )   HPI [name] presents for a well exam C/o stress w/mom; working from home  Outpatient Medications Prior to Visit  Medication Sig Dispense Refill  . carvedilol (COREG) 25 MG tablet TAKE 1 TABLET BY MOUTH TWICE A DAY WITH A MEAL 60 tablet 0  . furosemide (LASIX) 20 MG tablet TAKE 1 TABLET BY MOUTH EVERY DAY AS NEEDED FOR EDEMA 90 tablet 0  . loratadine (CLARITIN) 10 MG tablet Take 1 tablet (10 mg total) by mouth daily. 100 tablet 3  . triamcinolone cream (KENALOG) 0.5 % Apply 1 application topically 3 (three) times daily. 120 g 3   No facility-administered medications prior to visit.     ROS Review of Systems  Constitutional: Positive for unexpected weight change. Negative for activity change, appetite change, chills and fatigue.  HENT: Negative for congestion, mouth sores and sinus pressure.   Eyes: Negative for visual disturbance.  Respiratory: Negative for cough and chest tightness.   Gastrointestinal: Negative for abdominal pain and nausea.  Genitourinary: Negative for difficulty urinating, frequency and vaginal pain.  Musculoskeletal: Positive for arthralgias. Negative for back pain and gait problem.  Skin: Negative for pallor and rash.  Neurological: Negative for dizziness, tremors, weakness, numbness and headaches.  Psychiatric/Behavioral: Negative for confusion and sleep disturbance. The patient is nervous/anxious.     Objective:  BP 130/72   Pulse 70   Temp 98.8 F (37.1 C) (Oral)   Resp 16   Ht 5' 3.75" (1.619 m)   Wt 267 lb 2 oz (121.2 kg)   SpO2 98%   BMI 46.21 kg/m   BP Readings from Last 3 Encounters:  03/03/17 130/72  04/04/16 127/79  03/26/16 140/83    Wt Readings from Last 3 Encounters:  03/03/17 267 lb 2 oz (121.2 kg)  03/26/16 261 lb (118.4 kg)  01/04/16 256 lb (116.1 kg)    Physical Exam  Constitutional: She  appears well-developed. No distress.  HENT:  Head: Normocephalic.  Right Ear: External ear normal.  Left Ear: External ear normal.  Nose: Nose normal.  Mouth/Throat: Oropharynx is clear and moist.  Eyes: Conjunctivae are normal. Pupils are equal, round, and reactive to light. Right eye exhibits no discharge. Left eye exhibits no discharge.  Neck: Normal range of motion. Neck supple. No JVD present. No tracheal deviation present. No thyromegaly present.  Cardiovascular: Normal rate, regular rhythm and normal heart sounds.   Pulmonary/Chest: No stridor. No respiratory distress. She has no wheezes.  Abdominal: Soft. Bowel sounds are normal. She exhibits no distension and no mass. There is no tenderness. There is no rebound and no guarding.  Musculoskeletal: She exhibits no edema or tenderness.  Lymphadenopathy:    She has no cervical adenopathy.  Neurological: She displays normal reflexes. No cranial nerve deficit. She exhibits normal muscle tone. Coordination normal.  Skin: No rash noted. No erythema.  Psychiatric: She has a normal mood and affect. Her behavior is normal. Judgment and thought content normal.  Obese  Lab Results  Component Value Date   WBC 7.0 03/26/2016   HGB 12.9 03/26/2016   HCT 39.3 03/26/2016   PLT 216 03/26/2016   GLUCOSE 99 03/26/2016   CHOL 181 08/28/2015   TRIG 150.0 (H) 08/28/2015   HDL 35.60 (L) 08/28/2015   LDLCALC 116 (H) 08/28/2015   ALT 13 08/28/2015   AST 12  08/28/2015   NA 143 03/26/2016   K 3.8 03/26/2016   CL 109 03/26/2016   CREATININE 0.84 03/26/2016   BUN 17 03/26/2016   CO2 30 03/26/2016   TSH 0.90 08/28/2015   INR 1.09 07/13/2013   HGBA1C 5.9 08/28/2015    No results found.  Assessment & Plan:   There are no diagnoses linked to this encounter. I am having [name] maintain her triamcinolone cream, loratadine, furosemide, and carvedilol.  No orders of the defined types were placed in this encounter.    Follow-up: No Follow-up  on file.  Walker Kehr, MD

## 2017-03-03 NOTE — Progress Notes (Signed)
Pre-visit discussion using our clinic review tool. No additional management support is needed unless otherwise documented below in the visit note.  

## 2017-03-03 NOTE — Assessment & Plan Note (Signed)
Creon trial

## 2017-03-03 NOTE — Assessment & Plan Note (Signed)
Coreg and Furosemide

## 2017-09-03 ENCOUNTER — Ambulatory Visit: Payer: Managed Care, Other (non HMO) | Admitting: Internal Medicine

## 2017-09-08 ENCOUNTER — Encounter: Payer: Self-pay | Admitting: Internal Medicine

## 2017-09-08 ENCOUNTER — Ambulatory Visit (INDEPENDENT_AMBULATORY_CARE_PROVIDER_SITE_OTHER): Payer: Managed Care, Other (non HMO) | Admitting: Internal Medicine

## 2017-09-08 VITALS — BP 128/68 | HR 69 | Temp 98.5°F | Wt 266.0 lb

## 2017-09-08 DIAGNOSIS — J309 Allergic rhinitis, unspecified: Secondary | ICD-10-CM

## 2017-09-08 DIAGNOSIS — R140 Abdominal distension (gaseous): Secondary | ICD-10-CM | POA: Diagnosis not present

## 2017-09-08 DIAGNOSIS — Z299 Encounter for prophylactic measures, unspecified: Secondary | ICD-10-CM | POA: Diagnosis not present

## 2017-09-08 DIAGNOSIS — Z0000 Encounter for general adult medical examination without abnormal findings: Secondary | ICD-10-CM

## 2017-09-08 DIAGNOSIS — I10 Essential (primary) hypertension: Secondary | ICD-10-CM | POA: Diagnosis not present

## 2017-09-08 MED ORDER — VITAMIN D3 50 MCG (2000 UT) PO CAPS: 2000.0000 [IU] | ORAL_CAPSULE | Freq: Every day | ORAL | 3 refills | Status: DC

## 2017-09-08 MED ORDER — LORATADINE 10 MG PO TABS: 10.0000 mg | ORAL_TABLET | Freq: Every day | ORAL | 3 refills | Status: DC

## 2017-09-08 MED ORDER — CARVEDILOL 25 MG PO TABS: ORAL_TABLET | ORAL | 3 refills | Status: DC

## 2017-09-08 MED ORDER — FUROSEMIDE 20 MG PO TABS: ORAL_TABLET | ORAL | 3 refills | Status: DC

## 2017-09-08 NOTE — Assessment & Plan Note (Signed)
Coreg, Lasix 

## 2017-09-08 NOTE — Assessment & Plan Note (Signed)
Try Gluten free diet 

## 2017-09-08 NOTE — Patient Instructions (Addendum)
Try Valerian root to help with sleep       Try gluten free trial for 4-6 weeks. OK to use gluten-free bread and gluten-free pasta.    Gluten-Free Diet for Celiac Disease, Adult The gluten-free diet includes all foods that do not contain gluten. Gluten is a protein that is found in wheat, rye, barley, and some other grains. Following the gluten-free diet is the only treatment for people with celiac disease. It helps to prevent damage to the intestines and improves or eliminates the symptoms of celiac disease. Following the gluten-free diet requires some planning. It can be challenging at first, but it gets easier with time and practice. There are more gluten-free options available today than ever before. If you need help finding gluten-free foods or if you have questions, talk with your diet and nutrition specialist (registered dietitian) or your health care provider. What do I need to know about a gluten-free diet?  All fruits, vegetables, and meats are safe to eat and do not contain gluten.  When grocery shopping, start by shopping in the produce, meat, and dairy sections. These sections are more likely to contain gluten-free foods. Then move to the aisles that contain packaged foods if you need to.  Read all food labels. Gluten is often added to foods. Always check the ingredient list and look for warnings, such as "may contain gluten."  Talk with your dietitian or health care provider before taking a gluten-free multivitamin or mineral supplement.  Be aware of gluten-free foods having contact with foods that contain gluten (cross-contamination). This can happen at home and with any processed foods. ? Talk with your health care provider or dietitian about how to reduce the risk of cross-contamination in your home. ? If you have questions about how a food is processed, ask the manufacturer. What key words help to identify gluten? Foods that list any of these key words on the label  usually contain gluten:  Wheat, flour, enriched flour, bromated flour, white flour, durum flour, graham flour, phosphated flour, self-rising flour, semolina, farina, barley (malt), rye, and oats.  Starch, dextrin, modified food starch, or cereal.  Thickening, fillers, or emulsifiers.  Malt flavoring, malt extract, or malt syrup.  Hydrolyzed vegetable protein.  In the [place] packaged foods that are gluten-free are required to be labeled "GF." These foods should be easy to identify and are safe to eat. In the [place] food companies are also required to list common food allergens, including wheat, on their labels. Recommended foods Grains  Amaranth, bean flours, 100% buckwheat flour, corn, millet, nut flours or nut meals, GF oats, quinoa, rice, sorghum, teff, rice wafers, pure cornmeal tortillas, popcorn, and hot cereals made from cornmeal. Hominy, rice, wild rice. Some Asian rice noodles or bean noodles. Arrowroot starch, corn bran, corn flour, corn germ, cornmeal, corn starch, potato flour, potato starch flour, and rice bran. Plain, brown, and sweet rice flours. Rice polish, soy flour, and tapioca starch. Vegetables  All plain fresh, frozen, and canned vegetables. Fruits  All plain fresh, frozen, canned, and dried fruits, and 100% fruit juices. Meats and other protein foods  All fresh beef, pork, poultry, fish, seafood, and eggs. Fish canned in water, oil, brine, or vegetable broth. Plain nuts and seeds, peanut butter. Some lunch meat and some frankfurters. Dried beans, dried peas, and lentils. Dairy  Fresh plain, dry, evaporated, or condensed milk. Cream, butter, sour cream, whipping cream, and most yogurts. Unprocessed cheese, most processed cheeses, some cottage cheese, some cream cheeses. Beverages  Coffee, tea, most herbal teas. Carbonated beverages and some root beers. Wine, sake, and distilled spirits, such as gin, vodka, and whiskey. Most hard ciders. Fats and oils  Butter,  margarine, vegetable oil, hydrogenated butter, olive oil, shortening, lard, cream, and some mayonnaise. Some commercial salad dressings. Olives. Sweets and desserts  Sugar, honey, some syrups, molasses, jelly, and jam. Plain hard candy, marshmallows, and gumdrops. Pure cocoa powder. Plain chocolate. Custard and some pudding mixes. Gelatin desserts, sorbets, frozen ice pops, and sherbet. Cake, cookies, and other desserts prepared with allowed flours. Some commercial ice creams. Cornstarch, tapioca, and rice puddings. Seasoning and other foods  Some canned or frozen soups. Monosodium glutamate (MSG). Cider, rice, and wine vinegar. Baking soda and baking powder. Cream of tartar. Baking and nutritional yeast. Certain soy sauces made without wheat (ask your dietitian about specific brands that are allowed). Nuts, coconut, and chocolate. Salt, pepper, herbs, spices, flavoring extracts, imitation or artificial flavorings, natural flavorings, and food colorings. Some medicines and supplements. Some lip glosses and other cosmetics. Rice syrups. The items listed may not be a complete list. Talk with your dietitian about what dietary choices are best for you. Foods to avoid Grains  Barley, bran, bulgur, couscous, cracked wheat, [place], farro, graham, malt, matzo, semolina, wheat germ, and all wheat and rye cereals including spelt and kamut. Cereals containing malt as a flavoring, such as rice cereal. Noodles, spaghetti, macaroni, most packaged rice mixes, and all mixes containing wheat, rye, barley, or triticale. Vegetables  Most creamed vegetables and most vegetables canned in sauces. Some commercially prepared vegetables and salads. Fruits  Thickened or prepared fruits and some pie fillings. Some fruit snacks and fruit roll-ups. Meats and other protein foods  Any meat or meat alternative containing wheat, rye, barley, or gluten stabilizers. These are often marinated or packaged meats and lunch meats.  Bread-containing products, such as Swiss steak, croquettes, meatballs, and meatloaf. Most tuna canned in vegetable broth and [place] with hydrolyzed vegetable protein (HVP) injected as part of the basting. Seitan. Imitation fish. Eggs in sauces made from ingredients to avoid. Dairy  Commercial chocolate milk drinks and malted milk. Some non-dairy creamers. Any cheese product containing ingredients to avoid. Beverages  Certain cereal beverages. Beer, ale, malted milk, and some root beers. Some hard ciders. Some instant flavored coffees. Some herbal teas made with barley or with barley malt added. Fats and oils  Some commercial salad dressings. Sour cream containing modified food starch. Sweets and desserts  Some toffees. Chocolate-coated nuts (may be rolled in wheat flour) and some commercial candies and candy bars. Most cakes, cookies, donuts, pastries, and other baked goods. Some commercial ice cream. Ice cream cones. Commercially prepared mixes for cakes, cookies, and other desserts. Bread pudding and other puddings thickened with flour. Products containing brown rice syrup made with barley malt enzyme. Desserts and sweets made with malt flavoring. Seasoning and other foods  Some curry powders, some dry seasoning mixes, some gravy extracts, some meat sauces, some ketchups, some prepared mustards, and horseradish. Certain soy sauces. Malt vinegar. Bouillon and bouillon cubes that contain HVP. Some chip dips, and some chewing gum. Yeast extract. Brewer's yeast. Caramel color. Some medicines and supplements. Some lip glosses and other cosmetics. The items listed may not be a complete list. Talk with your dietitian about what dietary choices are best for you. Summary  Gluten is a protein that is found in wheat, rye, barley, and some other grains. The gluten-free diet includes all foods that do not contain  gluten.  If you need help finding gluten-free foods or if you have questions, talk with your  diet and nutrition specialist (registered dietitian) or your health care provider.  Read all food labels. Gluten is often added to foods. Always check the ingredient list and look for warnings, such as "may contain gluten." This information is not intended to replace advice given to you by your health care provider. Make sure you discuss any questions you have with your health care provider. Document Released: 11/25/2005 Document Revised: 09/09/2016 Document Reviewed: 09/09/2016 Elsevier Interactive Patient Education  2018 [employer].

## 2017-09-08 NOTE — Addendum Note (Signed)
Addended by: Ander Slade on: 09/08/2017 04:48 PM   Modules accepted: Orders

## 2017-09-08 NOTE — Progress Notes (Signed)
Subjective:  Patient ID: [name], female    DOB: 1956-03-07  Age: 61 y.o. MRN: [id number]  CC: No chief complaint on file.   HPI KHARMA SAMPSEL presents for HTN Stress w/mom - ongoing  Outpatient Medications Prior to Visit  Medication Sig Dispense Refill  . carvedilol (COREG) 25 MG tablet TAKE 1 TABLET BY MOUTH TWICE A DAY WITH A MEAL 60 tablet 11  . furosemide (LASIX) 20 MG tablet TAKE 1 TABLET BY MOUTH EVERY DAY AS NEEDED FOR EDEMA 90 tablet 3  . loratadine (CLARITIN) 10 MG tablet Take 1 tablet (10 mg total) by mouth daily. 100 tablet 3  . triamcinolone cream (KENALOG) 0.5 % Apply 1 application topically 3 (three) times daily. 120 g 3  . lipase/protease/amylase (CREON) 36000 UNITS CPEP capsule Take 1 capsule (36,000 Units total) by mouth 3 (three) times daily before meals. (Patient not taking: Reported on 09/08/2017) 90 capsule 3   No facility-administered medications prior to visit.     ROS Review of Systems  Constitutional: Negative for activity change, appetite change, chills, fatigue and unexpected weight change.  HENT: Negative for congestion, mouth sores and sinus pressure.   Eyes: Negative for visual disturbance.  Respiratory: Negative for cough and chest tightness.   Gastrointestinal: Negative for abdominal pain and nausea.  Genitourinary: Negative for difficulty urinating, frequency and vaginal pain.  Musculoskeletal: Negative for back pain and gait problem.  Skin: Negative for pallor and rash.  Neurological: Negative for dizziness, tremors, weakness, numbness and headaches.  Psychiatric/Behavioral: Negative for confusion and sleep disturbance. The patient is not nervous/anxious.     Objective:  BP 128/68   Pulse 69   Temp 98.5 F (36.9 C)   Wt 266 lb (120.7 kg)   SpO2 99%   BMI 46.02 kg/m   BP Readings from Last 3 Encounters:  09/08/17 128/68  03/03/17 130/72  04/04/16 127/79    Wt Readings from Last 3 Encounters:  09/08/17 266 lb (120.7 kg)    03/03/17 267 lb 2 oz (121.2 kg)  03/26/16 261 lb (118.4 kg)    Physical Exam  Constitutional: She appears well-developed. No distress.  HENT:  Head: Normocephalic.  Right Ear: External ear normal.  Left Ear: External ear normal.  Nose: Nose normal.  Mouth/Throat: Oropharynx is clear and moist.  Eyes: Pupils are equal, round, and reactive to light. Conjunctivae are normal. Right eye exhibits no discharge. Left eye exhibits no discharge.  Neck: Normal range of motion. Neck supple. No JVD present. No tracheal deviation present. No thyromegaly present.  Cardiovascular: Normal rate, regular rhythm and normal heart sounds.   Pulmonary/Chest: No stridor. No respiratory distress. She has no wheezes.  Abdominal: Soft. Bowel sounds are normal. She exhibits no distension and no mass. There is no tenderness. There is no rebound and no guarding.  Musculoskeletal: She exhibits no edema or tenderness.  Lymphadenopathy:    She has no cervical adenopathy.  Neurological: She displays normal reflexes. No cranial nerve deficit. She exhibits normal muscle tone. Coordination normal.  Skin: No rash noted. No erythema.  Psychiatric: She has a normal mood and affect. Her behavior is normal. Judgment and thought content normal.  obese  Lab Results  Component Value Date   WBC 7.3 03/03/2017   HGB 13.0 03/03/2017   HCT 39.1 03/03/2017   PLT 216.0 03/03/2017   GLUCOSE 96 03/03/2017   CHOL 162 03/03/2017   TRIG 104.0 03/03/2017   HDL 38.00 (L) 03/03/2017   LDLCALC 104 (H)  03/03/2017   ALT 16 03/03/2017   AST 14 03/03/2017   NA 141 03/03/2017   K 3.6 03/03/2017   CL 102 03/03/2017   CREATININE 0.85 03/03/2017   BUN 13 03/03/2017   CO2 34 (H) 03/03/2017   TSH 1.19 03/03/2017   INR 1.09 07/13/2013   HGBA1C 5.9 08/28/2015    No results found.  Assessment & Plan:   There are no diagnoses linked to this encounter. I am having [name] maintain her lipase/protease/amylase, carvedilol, furosemide,  loratadine, and triamcinolone cream.  No orders of the defined types were placed in this encounter.    Follow-up: No Follow-up on file.  Walker Kehr, MD

## 2017-09-08 NOTE — Assessment & Plan Note (Signed)
Claritin prn 

## 2018-03-11 ENCOUNTER — Ambulatory Visit: Payer: Managed Care, Other (non HMO) | Admitting: Internal Medicine

## 2018-03-30 ENCOUNTER — Other Ambulatory Visit (INDEPENDENT_AMBULATORY_CARE_PROVIDER_SITE_OTHER): Payer: Managed Care, Other (non HMO)

## 2018-03-30 ENCOUNTER — Encounter: Payer: Self-pay | Admitting: Internal Medicine

## 2018-03-30 ENCOUNTER — Ambulatory Visit: Payer: Managed Care, Other (non HMO) | Admitting: Internal Medicine

## 2018-03-30 VITALS — BP 124/66 | HR 71 | Temp 98.5°F | Ht 63.75 in | Wt 270.0 lb

## 2018-03-30 DIAGNOSIS — Z6834 Body mass index (BMI) 34.0-34.9, adult: Secondary | ICD-10-CM

## 2018-03-30 DIAGNOSIS — E6609 Other obesity due to excess calories: Secondary | ICD-10-CM | POA: Diagnosis not present

## 2018-03-30 DIAGNOSIS — I10 Essential (primary) hypertension: Secondary | ICD-10-CM

## 2018-03-30 DIAGNOSIS — D500 Iron deficiency anemia secondary to blood loss (chronic): Secondary | ICD-10-CM | POA: Diagnosis not present

## 2018-03-30 DIAGNOSIS — Z6841 Body Mass Index (BMI) 40.0 and over, adult: Secondary | ICD-10-CM

## 2018-03-30 DIAGNOSIS — J309 Allergic rhinitis, unspecified: Secondary | ICD-10-CM | POA: Diagnosis not present

## 2018-03-30 DIAGNOSIS — Z0000 Encounter for general adult medical examination without abnormal findings: Secondary | ICD-10-CM | POA: Diagnosis not present

## 2018-03-30 LAB — LIPID PANEL
CHOL/HDL RATIO: 4
Cholesterol: 166 mg/dL (ref 0–200)
HDL: 37.9 mg/dL — ABNORMAL LOW (ref >39.00)
LDL CALC: 99 mg/dL (ref 0–99)
NONHDL: 128.35
TRIGLYCERIDES: 149 mg/dL (ref 0.0–149.0)
VLDL: 29.8 mg/dL (ref 0.0–40.0)

## 2018-03-30 LAB — URINALYSIS
Bilirubin Urine: NEGATIVE
Hgb urine dipstick: NEGATIVE
Ketones, ur: NEGATIVE
LEUKOCYTES UA: NEGATIVE
Nitrite: NEGATIVE
SPECIFIC GRAVITY, URINE: 1.015 (ref 1.000–1.030)
Total Protein, Urine: NEGATIVE
UROBILINOGEN UA: 0.2 (ref 0.0–1.0)
Urine Glucose: NEGATIVE
pH: 5.5 (ref 5.0–8.0)

## 2018-03-30 LAB — BASIC METABOLIC PANEL
BUN: 14 mg/dL (ref 6–23)
CALCIUM: 9.7 mg/dL (ref 8.4–10.5)
CHLORIDE: 103 meq/L (ref 96–112)
CO2: 31 meq/L (ref 19–32)
Creatinine, Ser: 0.94 mg/dL (ref 0.40–1.20)
GFR: 63.89 mL/min (ref >60.00)
GLUCOSE: 115 mg/dL — AB (ref 70–99)
POTASSIUM: 4.4 meq/L (ref 3.5–5.1)
SODIUM: 141 meq/L (ref 135–145)

## 2018-03-30 LAB — CBC WITH DIFFERENTIAL/PLATELET
BASOS ABS: 0.1 10*3/uL (ref 0.0–0.1)
BASOS PCT: 0.9 % (ref 0.0–3.0)
EOS ABS: 0.4 10*3/uL (ref 0.0–0.7)
Eosinophils Relative: 5.4 % — ABNORMAL HIGH (ref 0.0–5.0)
HEMATOCRIT: 39.1 % (ref 36.0–46.0)
HEMOGLOBIN: 13 g/dL (ref 12.0–15.0)
LYMPHS PCT: 21.5 % (ref 12.0–46.0)
Lymphs Abs: 1.7 10*3/uL (ref 0.7–4.0)
MCHC: 33.4 g/dL (ref 30.0–36.0)
MCV: 89.6 fl (ref 78.0–100.0)
MONOS PCT: 10.4 % (ref 3.0–12.0)
Monocytes Absolute: 0.8 10*3/uL (ref 0.1–1.0)
Neutro Abs: 4.9 10*3/uL (ref 1.4–7.7)
Neutrophils Relative %: 61.8 % (ref 43.0–77.0)
Platelets: 223 10*3/uL (ref 150.0–400.0)
RBC: 4.36 Mil/uL (ref 3.87–5.11)
RDW: 14 % (ref 11.5–15.5)
WBC: 7.9 10*3/uL (ref 4.0–10.5)

## 2018-03-30 LAB — TSH: TSH: 0.96 u[IU]/mL (ref 0.35–4.50)

## 2018-03-30 LAB — HEPATIC FUNCTION PANEL
ALT: 16 U/L (ref 0–35)
AST: 16 U/L (ref 0–37)
Albumin: 4 g/dL (ref 3.5–5.2)
Alkaline Phosphatase: 59 U/L (ref 39–117)
BILIRUBIN TOTAL: 0.4 mg/dL (ref 0.2–1.2)
Bilirubin, Direct: 0.1 mg/dL (ref 0.0–0.3)
Total Protein: 7.2 g/dL (ref 6.0–8.3)

## 2018-03-30 MED ORDER — TRIAMCINOLONE ACETONIDE 0.5 % EX CREA: 1.0000 "application " | TOPICAL_CREAM | Freq: Three times a day (TID) | CUTANEOUS | 3 refills | Status: DC

## 2018-03-30 MED ORDER — FUROSEMIDE 20 MG PO TABS: ORAL_TABLET | ORAL | 3 refills | Status: DC

## 2018-03-30 MED ORDER — CARVEDILOL 25 MG PO TABS: ORAL_TABLET | ORAL | 3 refills | Status: DC

## 2018-03-30 NOTE — Assessment & Plan Note (Signed)
CBC

## 2018-03-30 NOTE — Assessment & Plan Note (Signed)
Coreg, Furosemide

## 2018-03-30 NOTE — Progress Notes (Signed)
Subjective:  Patient ID: [name], female    DOB: March 29, 1956  Age: 62 y.o. MRN: [id number]  CC: No chief complaint on file.   HPI [name] presents for HTN, allergies, obesity f/u  Outpatient Medications Prior to Visit  Medication Sig Dispense Refill  . carvedilol (COREG) 25 MG tablet TAKE 1 TABLET BY MOUTH TWICE A DAY WITH A MEAL 180 tablet 3  . Cholecalciferol (VITAMIN D3) 2000 units capsule Take 1 capsule (2,000 Units total) by mouth daily. 100 capsule 3  . furosemide (LASIX) 20 MG tablet TAKE 1 TABLET BY MOUTH EVERY DAY AS NEEDED FOR EDEMA 90 tablet 3  . loratadine (CLARITIN) 10 MG tablet Take 1 tablet (10 mg total) by mouth daily. 100 tablet 3  . triamcinolone cream (KENALOG) 0.5 % Apply 1 application topically 3 (three) times daily. 120 g 3   No facility-administered medications prior to visit.     ROS Review of Systems  Constitutional: Positive for unexpected weight change. Negative for activity change, appetite change, chills and fatigue.  HENT: Negative for congestion, mouth sores and sinus pressure.   Eyes: Negative for visual disturbance.  Respiratory: Negative for cough and chest tightness.   Gastrointestinal: Negative for abdominal pain and nausea.  Genitourinary: Negative for difficulty urinating, frequency and vaginal pain.  Musculoskeletal: Negative for back pain and gait problem.  Skin: Negative for pallor and rash.  Neurological: Negative for dizziness, tremors, weakness, numbness and headaches.  Psychiatric/Behavioral: Negative for confusion and sleep disturbance. The patient is nervous/anxious.     Objective:  BP 124/66 (BP Location: Left Arm, Patient Position: Sitting, Cuff Size: Large)   Pulse 71   Temp 98.5 F (36.9 C) (Oral)   Ht 5' 3.75" (1.619 m)   Wt 270 lb (122.5 kg)   SpO2 95%   BMI 46.71 kg/m   BP Readings from Last 3 Encounters:  03/30/18 124/66  09/08/17 128/68  03/03/17 130/72    Wt Readings from Last 3 Encounters:    03/30/18 270 lb (122.5 kg)  09/08/17 266 lb (120.7 kg)  03/03/17 267 lb 2 oz (121.2 kg)    Physical Exam  Constitutional: She appears well-developed. No distress.  HENT:  Head: Normocephalic.  Right Ear: External ear normal.  Left Ear: External ear normal.  Nose: Nose normal.  Mouth/Throat: Oropharynx is clear and moist.  Eyes: Pupils are equal, round, and reactive to light. Conjunctivae are normal. Right eye exhibits no discharge. Left eye exhibits no discharge.  Neck: Normal range of motion. Neck supple. No JVD present. No tracheal deviation present. No thyromegaly present.  Cardiovascular: Normal rate, regular rhythm and normal heart sounds.  Pulmonary/Chest: No stridor. No respiratory distress. She has no wheezes.  Abdominal: Soft. Bowel sounds are normal. She exhibits no distension and no mass. There is no tenderness. There is no rebound and no guarding.  Musculoskeletal: She exhibits no edema or tenderness.  Lymphadenopathy:    She has no cervical adenopathy.  Neurological: She displays normal reflexes. No cranial nerve deficit. She exhibits normal muscle tone. Coordination normal.  Skin: No rash noted. No erythema.  Psychiatric: She has a normal mood and affect. Her behavior is normal. Judgment and thought content normal.     obese  Lab Results  Component Value Date   WBC 7.3 03/03/2017   HGB 13.0 03/03/2017   HCT 39.1 03/03/2017   PLT 216.0 03/03/2017   GLUCOSE 96 03/03/2017   CHOL 162 03/03/2017   TRIG 104.0 03/03/2017  HDL 38.00 (L) 03/03/2017   LDLCALC 104 (H) 03/03/2017   ALT 16 03/03/2017   AST 14 03/03/2017   NA 141 03/03/2017   K 3.6 03/03/2017   CL 102 03/03/2017   CREATININE 0.85 03/03/2017   BUN 13 03/03/2017   CO2 34 (H) 03/03/2017   TSH 1.19 03/03/2017   INR 1.09 07/13/2013   HGBA1C 5.9 08/28/2015    No results found.  Assessment & Plan:   There are no diagnoses linked to this encounter. I am having [name] maintain her  triamcinolone cream, Vitamin D3, carvedilol, furosemide, and loratadine.  No orders of the defined types were placed in this encounter.    Follow-up: No follow-ups on file.  Walker Kehr, MD

## 2018-03-30 NOTE — Assessment & Plan Note (Signed)
Wt Readings from Last 3 Encounters:  03/30/18 270 lb (122.5 kg)  09/08/17 266 lb (120.7 kg)  03/03/17 267 lb 2 oz (121.2 kg)

## 2018-03-30 NOTE — Assessment & Plan Note (Signed)
Claritin prn 

## 2018-04-28 ENCOUNTER — Telehealth: Payer: Self-pay | Admitting: Internal Medicine

## 2018-04-28 NOTE — Telephone Encounter (Signed)
Called and spoke with patient and informed her she would have to be seen to get a zpack. She is unable to come into office. I informed her she could use her mychart and do a e visit.

## 2018-04-28 NOTE — Telephone Encounter (Signed)
Copied from [place] [id number]. Topic: General - Other >> Apr 28, 2018 10:35 AM Lennox Solders wrote: Reason for CRM: pt last seen dr plotnikov on 03-30-18. Pt now has a cold and headache and would like a zpak call into [facility]. Pt decline an appt

## 2018-05-14 ENCOUNTER — Encounter (INDEPENDENT_AMBULATORY_CARE_PROVIDER_SITE_OTHER): Payer: Self-pay

## 2018-05-26 ENCOUNTER — Ambulatory Visit (INDEPENDENT_AMBULATORY_CARE_PROVIDER_SITE_OTHER): Payer: Managed Care, Other (non HMO) | Admitting: Family Medicine

## 2018-05-26 ENCOUNTER — Encounter (INDEPENDENT_AMBULATORY_CARE_PROVIDER_SITE_OTHER): Payer: Self-pay | Admitting: Family Medicine

## 2018-05-26 VITALS — BP 144/79 | HR 71 | Ht 64.0 in | Wt 266.0 lb

## 2018-05-26 DIAGNOSIS — R739 Hyperglycemia, unspecified: Secondary | ICD-10-CM | POA: Diagnosis not present

## 2018-05-26 DIAGNOSIS — Z1331 Encounter for screening for depression: Secondary | ICD-10-CM

## 2018-05-26 DIAGNOSIS — Z9189 Other specified personal risk factors, not elsewhere classified: Secondary | ICD-10-CM

## 2018-05-26 DIAGNOSIS — R0602 Shortness of breath: Secondary | ICD-10-CM | POA: Diagnosis not present

## 2018-05-26 DIAGNOSIS — R5383 Other fatigue: Secondary | ICD-10-CM | POA: Diagnosis not present

## 2018-05-26 DIAGNOSIS — I10 Essential (primary) hypertension: Secondary | ICD-10-CM

## 2018-05-26 DIAGNOSIS — Z0289 Encounter for other administrative examinations: Secondary | ICD-10-CM

## 2018-05-26 DIAGNOSIS — Z6841 Body Mass Index (BMI) 40.0 and over, adult: Secondary | ICD-10-CM | POA: Diagnosis not present

## 2018-05-27 LAB — HEMOGLOBIN A1C
Est. average glucose Bld gHb Est-mCnc: 126 mg/dL
Hgb A1c MFr Bld: 6 % — ABNORMAL HIGH (ref 4.8–5.6)

## 2018-05-27 LAB — COMPREHENSIVE METABOLIC PANEL
ALBUMIN: 3.9 g/dL (ref 3.6–4.8)
ALT: 17 IU/L (ref 0–32)
AST: 18 IU/L (ref 0–40)
Albumin/Globulin Ratio: 1.5 (ref 1.2–2.2)
Alkaline Phosphatase: 57 IU/L (ref 39–117)
BUN / CREAT RATIO: 17 (ref 12–28)
BUN: 14 mg/dL (ref 8–27)
Bilirubin Total: 0.3 mg/dL (ref 0.0–1.2)
CALCIUM: 9.2 mg/dL (ref 8.7–10.3)
CO2: 25 mmol/L (ref 20–29)
CREATININE: 0.81 mg/dL (ref 0.57–1.00)
Chloride: 103 mmol/L (ref 96–106)
GFR calc Af Amer: 89 mL/min/{1.73_m2} (ref >59)
GFR, EST NON AFRICAN AMERICAN: 78 mL/min/{1.73_m2} (ref >59)
GLOBULIN, TOTAL: 2.6 g/dL (ref 1.5–4.5)
Glucose: 97 mg/dL (ref 65–99)
Potassium: 4.3 mmol/L (ref 3.5–5.2)
SODIUM: 141 mmol/L (ref 134–144)
TOTAL PROTEIN: 6.5 g/dL (ref 6.0–8.5)

## 2018-05-27 LAB — INSULIN, RANDOM: INSULIN: 10.1 u[IU]/mL (ref 2.6–24.9)

## 2018-05-27 LAB — VITAMIN D 25 HYDROXY (VIT D DEFICIENCY, FRACTURES): Vit D, 25-Hydroxy: 37.5 ng/mL (ref 30.0–100.0)

## 2018-05-27 NOTE — Progress Notes (Signed)
Office: [phone]  /  Fax: [phone]   Dear Dr. Alain Marion,   Thank you for referring Jeanella Anton to our clinic. The following note includes my evaluation and treatment recommendations.  HPI:   Chief Complaint: OBESITY    CARMINE YOUNGBERG has been referred by Evie Lacks. Plotnikov, MD for consultation regarding her obesity and obesity related comorbidities.    SANJUANA MRUK (MR# [id number]) is a 62 y.o. female who presents on 05/26/2018 for obesity evaluation and treatment. Current BMI is Body mass index is 45.66 kg/m.Marland Kitchen Mikalia has been struggling with her weight for many years and has been unsuccessful in either losing weight, maintaining weight loss, or reaching her healthy weight goal.     Tianni attended our information session and states she is currently in the action stage of change and ready to dedicate time achieving and maintaining a healthier weight. Jennavieve is interested in becoming our patient and working on intensive lifestyle modifications including (but not limited to) diet, exercise and weight loss.    Elonda states her family eats meals together her desired weight loss is 126 lbs she started gaining weight ex boyfriend putting additives in food for 2 years her heaviest weight ever was 280 lbs she snacks frequently in the evenings she skips meals frequently she is frequently drinking liquids with calories she frequently makes poor food choices she frequently eats larger portions than normal    Fatigue Faron feels her energy is lower than it should be. This has worsened with weight gain and has not worsened recently. Eathel admits to daytime somnolence and  admits to waking up still tired. Patient is at risk for obstructive sleep apnea. Patent has a history of symptoms of daytime fatigue. Patient generally gets 6 hours of sleep per night, and states they generally have nightime awakenings. Snoring is present. Apneic episodes are present. Epworth Sleepiness Score  is 5.  Dyspnea on exertion Malka notes increasing shortness of breath with exercising and seems to be worsening over time with weight gain. She notes getting out of breath sooner with activity than she used to. This has not gotten worse recently. Cheria denies orthopnea.  Hyperglycemia Rozell has a history of some elevated fasting glucose levels without a diagnosis of diabetes. No recent A1c and she admits to polyphagia.  Hypertension TIMBER MARSHMAN is a 62 y.o. female with hypertension. Yanice's blood pressure is elevated today, she states mostly controlled. She denies chest pain or headache. She is working weight loss to help control her blood pressure with the goal of decreasing her risk of heart attack and stroke. Jarae's blood pressure is not currently controlled.  At risk for cardiovascular disease Marleigh is at a higher than average risk for cardiovascular disease due to obesity and hypertension. She currently denies any chest pain.  Depression Screen Jayd's Food and Mood (modified PHQ-9) score was  Depression screen PHQ 2/9 05/26/2018  Decreased Interest 3  Down, Depressed, Hopeless 2  PHQ - 2 Score 5  Altered sleeping 1  Tired, decreased energy 3  Change in appetite 0  Feeling bad or failure about yourself  0  Trouble concentrating 0  Moving slowly or fidgety/restless 0  Suicidal thoughts 0  PHQ-9 Score 9  Difficult doing work/chores Somewhat difficult    ALLERGIES: Allergies  Allergen Reactions  . Hydrochlorothiazide     REACTION: cramps  . Phentermine Hcl     REACTION: dizzy  . Valsartan     REACTION: side effect- lightheadedness  .  Verapamil     REACTION: ? lightheaded    MEDICATIONS: Current Outpatient Medications on File Prior to Visit  Medication Sig Dispense Refill  . carvedilol (COREG) 25 MG tablet TAKE 1 TABLET BY MOUTH TWICE A DAY WITH A MEAL 180 tablet 3  . furosemide (LASIX) 20 MG tablet TAKE 1 TABLET BY MOUTH EVERY DAY AS NEEDED FOR  EDEMA 90 tablet 3  . loratadine (CLARITIN) 10 MG tablet Take 1 tablet (10 mg total) by mouth daily. 100 tablet 3  . triamcinolone cream (KENALOG) 0.5 % Apply 1 application topically 3 (three) times daily. 120 g 3   No current facility-administered medications on file prior to visit.     PAST MEDICAL HISTORY: Past Medical History:  Diagnosis Date  . Abnormal glucose 2009  . Allergic rhinitis   . Back pain   . COPD (chronic obstructive pulmonary disease) ([place])   . Dyspnea   . Food allergy   . GERD (gastroesophageal reflux disease)   . Headache    Migraines (rare)  . HTN (hypertension)   . Lactose intolerance   . Leg edema   . Osteoarthritis    Right Hip - SEVERE    PAST SURGICAL HISTORY: Past Surgical History:  Procedure Laterality Date  . CHOLECYSTECTOMY    . COLONOSCOPY    . hole repair in heart     2004 at [facility]   . HYSTEROSCOPY W/D&C N/A 04/04/2016   Procedure: DILATATION AND CURETTAGE /HYSTEROSCOPY, POLYPECTOMY WITH MYOSURE;  Surgeon: Olga Millers, MD;  Location: [facility];  Service: Gynecology;  Laterality: N/A;  . KNEE ARTHROSCOPY     left   . TOTAL HIP ARTHROPLASTY Right 07/20/2013   Procedure: RIGHT TOTAL HIP ARTHROPLASTY ANTERIOR APPROACH;  Surgeon: Mauri Pole, MD;  Location: [facility];  Service: Orthopedics;  Laterality: Right;  . TUBAL LIGATION    . WISDOM TOOTH EXTRACTION      SOCIAL HISTORY: Social History   Tobacco Use  . Smoking status: Former Smoker    Packs/day: 0.50    Years: 40.00    Pack years: 20.00    Types: Cigarettes    Last attempt to quit: 12/09/2011    Years since quitting: 6.4  . Smokeless tobacco: Never Used  . Tobacco comment: 2009  Substance Use Topics  . Alcohol use: Yes    Alcohol/week: 4.2 oz    Types: 7 Glasses of wine per week    Comment: 1 glass of wine nightly  . Drug use: No    FAMILY HISTORY: Family History  Problem Relation Age of Onset  . Parkinsonism Mother   . Hypertension Mother   . Hyperlipidemia Mother     . Hypertension Unknown     ROS: Review of Systems  Constitutional: Positive for malaise/fatigue. Negative for weight loss.  Eyes:       + Wear glasses or contacts  Respiratory: Positive for shortness of breath (with exertion) and wheezing.   Cardiovascular: Negative for chest pain and orthopnea.       + Leg cramping  Gastrointestinal: Positive for heartburn.  Musculoskeletal: Positive for back pain.       + Swollen glands  Skin: Positive for rash.  Endo/Heme/Allergies:       Positive polyphagia    PHYSICAL EXAM: Blood pressure (!) 144/79, pulse 71, height 5\' 4"  (1.626 m), weight 266 lb (120.7 kg), SpO2 97 %. Body mass index is 45.66 kg/m. Physical Exam  Constitutional: She is oriented to person, place, and time. She  appears well-developed and well-nourished.  HENT:  Head: Normocephalic and atraumatic.  Nose: Nose normal.  Eyes: EOM are normal. No scleral icterus.  Neck: Normal range of motion. Neck supple. No thyromegaly present.  Cardiovascular: Normal rate and regular rhythm.  Pulmonary/Chest: Effort normal. No respiratory distress.  Abdominal: Soft. There is no tenderness.  + Obesity  Musculoskeletal:  Range of Motion normal in all 4 extremities Trace edema noted in bilateral lower extremities  Neurological: She is alert and oriented to person, place, and time. Coordination normal.  Skin: Skin is warm and dry.  Psychiatric: She has a normal mood and affect. Her behavior is normal.  Vitals reviewed.   RECENT LABS AND TESTS: BMET    Component Value Date/Time   NA 141 05/26/2018 0000   K 4.3 05/26/2018 0000   CL 103 05/26/2018 0000   CO2 25 05/26/2018 0000   GLUCOSE 97 05/26/2018 0000   GLUCOSE 115 (H) 03/30/2018 1642   BUN 14 05/26/2018 0000   CREATININE 0.81 05/26/2018 0000   CALCIUM 9.2 05/26/2018 0000   GFRNONAA 78 05/26/2018 0000   GFRAA 89 05/26/2018 0000   Lab Results  Component Value Date   HGBA1C 6.0 (H) 05/26/2018   Lab Results  Component  Value Date   INSULIN 10.1 05/26/2018   CBC    Component Value Date/Time   WBC 7.9 03/30/2018 1642   RBC 4.36 03/30/2018 1642   HGB 13.0 03/30/2018 1642   HCT 39.1 03/30/2018 1642   PLT 223.0 03/30/2018 1642   MCV 89.6 03/30/2018 1642   MCH 30.3 03/26/2016 0905   MCHC 33.4 03/30/2018 1642   RDW 14.0 03/30/2018 1642   LYMPHSABS 1.7 03/30/2018 1642   MONOABS 0.8 03/30/2018 1642   EOSABS 0.4 03/30/2018 1642   BASOSABS 0.1 03/30/2018 1642   Iron/TIBC/Ferritin/ %Sat No results found for: IRON, TIBC, FERRITIN, IRONPCTSAT Lipid Panel     Component Value Date/Time   CHOL 166 03/30/2018 1642   TRIG 149.0 03/30/2018 1642   HDL 37.90 (L) 03/30/2018 1642   CHOLHDL 4 03/30/2018 1642   VLDL 29.8 03/30/2018 1642   LDLCALC 99 03/30/2018 1642   Hepatic Function Panel     Component Value Date/Time   PROT 6.5 05/26/2018 0000   ALBUMIN 3.9 05/26/2018 0000   AST 18 05/26/2018 0000   ALT 17 05/26/2018 0000   ALKPHOS 57 05/26/2018 0000   BILITOT 0.3 05/26/2018 0000   BILIDIR 0.1 03/30/2018 1642      Component Value Date/Time   TSH 0.96 03/30/2018 1642   TSH 1.19 03/03/2017 1122   TSH 0.90 08/28/2015 1634    ECG  shows NSR with a rate of 71 BPM INDIRECT CALORIMETER done today shows a VO2 of 241 and a REE of 1677.  Her calculated basal metabolic rate is 8527 thus her basal metabolic rate is worse than expected.    ASSESSMENT AND PLAN: Other fatigue - Plan: EKG 12-Lead, VITAMIN D 25 Hydroxy (Vit-D Deficiency, Fractures)  Shortness of breath on exertion  Hyperglycemia - Plan: Comprehensive metabolic panel, Hemoglobin A1c, Insulin, random  Essential hypertension  Depression screening  At risk for heart disease  Class 3 severe obesity with serious comorbidity and body mass index (BMI) of 45.0 to 49.9 in adult, unspecified obesity type ([place])  PLAN:  Fatigue Jaquana was informed that her fatigue may be related to obesity, depression or many other causes. Labs will be ordered,  and in the meanwhile Lyberti has agreed to work on diet, exercise and weight  loss to help with fatigue. Proper sleep hygiene was discussed including the need for 7-8 hours of quality sleep each night. A sleep study was not ordered based on symptoms and Epworth score.  Dyspnea on exertion Tyleah's shortness of breath appears to be obesity related and exercise induced. She has agreed to work on weight loss and gradually increase exercise to treat her exercise induced shortness of breath. If Wilmarie follows our instructions and loses weight without improvement of her shortness of breath, we will plan to refer to pulmonology. We will monitor this condition regularly. Kaytlynn agrees to this plan.  Hyperglycemia Fasting labs will be obtained and results with be discussed with Joaquim Lai in 2 weeks at her follow up visit. In the meanwhile Eugenie was started on a lower simple carbohydrate diet and will work on weight loss efforts.  Hypertension We discussed sodium restriction, working on healthy weight loss, and a regular exercise program as the means to achieve improved blood pressure control. Babette agreed with this plan and agreed to follow up as directed. We will continue to monitor her blood pressure as well as her progress with the above lifestyle modifications. She will continue her medications, start diet and exercise and she will watch for signs of hypotension as she continues her lifestyle modifications. We will check labs and Everley agrees to follow up with our clinic in 2 weeks.  Cardiovascular risk counselling Lian was given extended (15 minutes) coronary artery disease prevention counseling today. She is 62 y.o. female and has risk factors for heart disease including obesity and hypertension. We discussed intensive lifestyle modifications today with an emphasis on specific weight loss instructions and strategies. Pt was also informed of the importance of increasing exercise and decreasing  saturated fats to help prevent heart disease.  Depression Screen Jeraldine had a mildly positive depression screening. Depression is commonly associated with obesity and often results in emotional eating behaviors. We will monitor this closely and work on CBT to help improve the non-hunger eating patterns. Referral to Psychology may be required if no improvement is seen as she continues in our clinic.  Obesity Breslyn is currently in the action stage of change and her goal is to continue with weight loss efforts. I recommend Guadalupe begin the structured treatment plan as follows:  She has agreed to follow the Category 3 plan Lachlyn has been instructed to eventually work up to a goal of 150 minutes of combined cardio and strengthening exercise per week for weight loss and overall health benefits. We discussed the following Behavioral Modification Strategies today: increasing lean protein intake, decreasing simple carbohydrates  and work on meal planning and easy cooking plans   She was informed of the importance of frequent follow up visits to maximize her success with intensive lifestyle modifications for her multiple health conditions. She was informed we would discuss her lab results at her next visit unless there is a critical issue that needs to be addressed sooner. Aubrianna agreed to keep her next visit at the agreed upon time to discuss these results.    OBESITY BEHAVIORAL INTERVENTION VISIT  Today's visit was # 1 out of 22.  Starting weight: 266 lbs Starting date: 05/26/18 Today's weight : 266 lbs  Today's date: 05/26/2018 Total lbs lost to date: 0 (Patients must lose 7 lbs in the first 6 months to continue with counseling)   ASK: We discussed the diagnosis of obesity with Jeanella Anton today and Hodaya agreed to give [place] permission to discuss obesity  behavioral modification therapy today.  ASSESS: Jaziya has the diagnosis of obesity and her BMI today is 45.64 Kenisha is in the  action stage of change   ADVISE: Ingeborg was educated on the multiple health risks of obesity as well as the benefit of weight loss to improve her health. She was advised of the need for long term treatment and the importance of lifestyle modifications.  AGREE: Multiple dietary modification options and treatment options were discussed and  Waynette agreed to the above obesity treatment plan.   I, Trixie Dredge, am acting as transcriptionist for Dennard Nip, MD   I have reviewed the above documentation for accuracy and completeness, and I agree with the above. -Dennard Nip, MD

## 2018-06-08 ENCOUNTER — Ambulatory Visit (INDEPENDENT_AMBULATORY_CARE_PROVIDER_SITE_OTHER): Payer: Managed Care, Other (non HMO) | Admitting: Family Medicine

## 2018-06-08 VITALS — BP 132/79 | HR 70 | Temp 97.7°F | Ht 64.0 in | Wt 262.0 lb

## 2018-06-08 DIAGNOSIS — R7303 Prediabetes: Secondary | ICD-10-CM

## 2018-06-08 DIAGNOSIS — E559 Vitamin D deficiency, unspecified: Secondary | ICD-10-CM

## 2018-06-08 DIAGNOSIS — Z6841 Body Mass Index (BMI) 40.0 and over, adult: Secondary | ICD-10-CM

## 2018-06-08 DIAGNOSIS — Z9189 Other specified personal risk factors, not elsewhere classified: Secondary | ICD-10-CM | POA: Diagnosis not present

## 2018-06-08 MED ORDER — VITAMIN D (ERGOCALCIFEROL) 1.25 MG (50000 UNIT) PO CAPS: 50000.0000 [IU] | ORAL_CAPSULE | ORAL | 0 refills | Status: DC

## 2018-06-09 NOTE — Progress Notes (Signed)
Office: [phone]  /  Fax: [phone]   HPI:   Chief Complaint: OBESITY [name] is here to discuss her progress with her obesity treatment plan. She is on the Category 3 plan and is following her eating plan approximately 100 % of the time. She states she is exercising 0 minutes 0 times per week. [name] did well on her Category 3 plan. She liked the structure of the Category 3 plan and hunger was mostly controlled. She like her food choices.  Her weight is 262 lb (118.8 kg) today and has had a weight loss of 4 pounds over a period of 2 weeks since her last visit. She has lost 4 lbs since starting treatment with [place].  Vitamin D Deficiency [name] has a new diagnosis of vitamin D deficiency. She is not on Vit D, she notes fatigue and denies nausea, vomiting or muscle weakness.  Pre-Diabetes [name] has a diagnosis of pre-diabetes based on her elevated Hgb A1c and was informed this puts her at greater risk of developing diabetes. Hgb A1c elevated at 6.0 and she notes polyphagia but this improved on diet prescription. She is not taking metformin currently and continues to work on diet and exercise to decrease risk of diabetes. She denies nausea or hypoglycemia.  At risk for diabetes [name] is at higher than average risk for developing diabetes due to her obesity and pre-diabetes. She currently denies polyuria or polydipsia.  ALLERGIES: Allergies  Allergen Reactions  . Hydrochlorothiazide     REACTION: cramps  . Phentermine Hcl     REACTION: dizzy  . Valsartan     REACTION: side effect- lightheadedness  . Verapamil     REACTION: ? lightheaded    MEDICATIONS: Current Outpatient Medications on File Prior to Visit  Medication Sig Dispense Refill  . carvedilol (COREG) 25 MG tablet TAKE 1 TABLET BY MOUTH TWICE A DAY WITH A MEAL 180 tablet 3  . furosemide (LASIX) 20 MG tablet TAKE 1 TABLET BY MOUTH EVERY DAY AS NEEDED FOR EDEMA 90 tablet 3  . loratadine (CLARITIN) 10 MG tablet  Take 1 tablet (10 mg total) by mouth daily. 100 tablet 3  . triamcinolone cream (KENALOG) 0.5 % Apply 1 application topically 3 (three) times daily. 120 g 3   No current facility-administered medications on file prior to visit.     PAST MEDICAL HISTORY: Past Medical History:  Diagnosis Date  . Abnormal glucose 2009  . Allergic rhinitis   . Back pain   . COPD (chronic obstructive pulmonary disease) ([place])   . Dyspnea   . Food allergy   . GERD (gastroesophageal reflux disease)   . Headache    Migraines (rare)  . HTN (hypertension)   . Lactose intolerance   . Leg edema   . Osteoarthritis    Right Hip - SEVERE    PAST SURGICAL HISTORY: Past Surgical History:  Procedure Laterality Date  . CHOLECYSTECTOMY    . COLONOSCOPY    . hole repair in heart     2004 at [place]   . HYSTEROSCOPY W/D&C N/A 04/04/2016   Procedure: DILATATION AND CURETTAGE /HYSTEROSCOPY, POLYPECTOMY WITH MYOSURE;  Surgeon: Olga Millers, MD;  Location: [facility];  Service: Gynecology;  Laterality: N/A;  . KNEE ARTHROSCOPY     left   . TOTAL HIP ARTHROPLASTY Right 07/20/2013   Procedure: RIGHT TOTAL HIP ARTHROPLASTY ANTERIOR APPROACH;  Surgeon: Mauri Pole, MD;  Location: [facility];  Service: Orthopedics;  Laterality: Right;  . TUBAL LIGATION    .  WISDOM TOOTH EXTRACTION      SOCIAL HISTORY: Social History   Tobacco Use  . Smoking status: Former Smoker    Packs/day: 0.50    Years: 40.00    Pack years: 20.00    Types: Cigarettes    Last attempt to quit: 12/09/2011    Years since quitting: 6.5  . Smokeless tobacco: Never Used  . Tobacco comment: 2009  Substance Use Topics  . Alcohol use: Yes    Alcohol/week: 4.2 oz    Types: 7 Glasses of wine per week    Comment: 1 glass of wine nightly  . Drug use: No    FAMILY HISTORY: Family History  Problem Relation Age of Onset  . Parkinsonism Mother   . Hypertension Mother   . Hyperlipidemia Mother   . Hypertension Unknown     ROS: Review of Systems    Constitutional: Positive for malaise/fatigue and weight loss.  Gastrointestinal: Negative for nausea and vomiting.  Genitourinary: Negative for frequency.  Musculoskeletal:       Negative muscle weakness  Endo/Heme/Allergies: Negative for polydipsia.       Positive polyphagia Negative hypoglycemia    PHYSICAL EXAM: Blood pressure 132/79, pulse 70, temperature 97.7 F (36.5 C), temperature source Oral, height 5\' 4"  (1.626 m), weight 262 lb (118.8 kg), SpO2 98 %. Body mass index is 44.97 kg/m. Physical Exam  Constitutional: She is oriented to person, place, and time. She appears well-developed and well-nourished.  Cardiovascular: Normal rate.  Pulmonary/Chest: Effort normal.  Musculoskeletal: Normal range of motion.  Neurological: She is oriented to person, place, and time.  Skin: Skin is warm and dry.  Psychiatric: She has a normal mood and affect. Her behavior is normal.  Vitals reviewed.   RECENT LABS AND TESTS: BMET    Component Value Date/Time   NA 141 05/26/2018 0000   K 4.3 05/26/2018 0000   CL 103 05/26/2018 0000   CO2 25 05/26/2018 0000   GLUCOSE 97 05/26/2018 0000   GLUCOSE 115 (H) 03/30/2018 1642   BUN 14 05/26/2018 0000   CREATININE 0.81 05/26/2018 0000   CALCIUM 9.2 05/26/2018 0000   GFRNONAA 78 05/26/2018 0000   GFRAA 89 05/26/2018 0000   Lab Results  Component Value Date   HGBA1C 6.0 (H) 05/26/2018   HGBA1C 5.9 08/28/2015   HGBA1C 6.3 10/26/2010   HGBA1C 6.3 07/25/2010   HGBA1C 6.1 (H) 01/27/2009   Lab Results  Component Value Date   INSULIN 10.1 05/26/2018   CBC    Component Value Date/Time   WBC 7.9 03/30/2018 1642   RBC 4.36 03/30/2018 1642   HGB 13.0 03/30/2018 1642   HCT 39.1 03/30/2018 1642   PLT 223.0 03/30/2018 1642   MCV 89.6 03/30/2018 1642   MCH 30.3 03/26/2016 0905   MCHC 33.4 03/30/2018 1642   RDW 14.0 03/30/2018 1642   LYMPHSABS 1.7 03/30/2018 1642   MONOABS 0.8 03/30/2018 1642   EOSABS 0.4 03/30/2018 1642   BASOSABS  0.1 03/30/2018 1642   Iron/TIBC/Ferritin/ %Sat No results found for: IRON, TIBC, FERRITIN, IRONPCTSAT Lipid Panel     Component Value Date/Time   CHOL 166 03/30/2018 1642   TRIG 149.0 03/30/2018 1642   HDL 37.90 (L) 03/30/2018 1642   CHOLHDL 4 03/30/2018 1642   VLDL 29.8 03/30/2018 1642   LDLCALC 99 03/30/2018 1642   Hepatic Function Panel     Component Value Date/Time   PROT 6.5 05/26/2018 0000   ALBUMIN 3.9 05/26/2018 0000   AST 18 05/26/2018  0000   ALT 17 05/26/2018 0000   ALKPHOS 57 05/26/2018 0000   BILITOT 0.3 05/26/2018 0000   BILIDIR 0.1 03/30/2018 1642      Component Value Date/Time   TSH 0.96 03/30/2018 1642   TSH 1.19 03/03/2017 1122   TSH 0.90 08/28/2015 1634  Results for PRAGYA, LOFASO (MRN [id number]) as of 06/09/2018 09:44  Ref. Range 05/26/2018 00:00  Vitamin D, 25-Hydroxy Latest Ref Range: 30.0 - 100.0 ng/mL 37.5    ASSESSMENT AND PLAN: Vitamin D deficiency - Plan: Vitamin D, Ergocalciferol, (DRISDOL) 50000 units CAPS capsule  Prediabetes  At risk for diabetes mellitus  Class 3 severe obesity with serious comorbidity and body mass index (BMI) of 45.0 to 49.9 in adult, unspecified obesity type ([place])  PLAN:  Vitamin D Deficiency [name] was informed that low vitamin D levels contributes to fatigue and are associated with obesity, breast, and colon cancer. [name] agrees to start prescription Vit D @50 ,000 IU every week #4 with no refills. She will follow up for routine testing of vitamin D, at least 2-3 times per year. She was informed of the risk of over-replacement of vitamin D and agrees to not increase her dose unless she discusses this with [place] first. We will recheck labs in 3 months and [name] agrees to follow up with our clinic in 2 to 3 weeks.  Pre-Diabetes [name] will continue to work on weight loss, diet, exercise, and decreasing simple carbohydrates in her diet to help decrease the risk of diabetes. We dicussed metformin including benefits and  risks. She was informed that eating too many simple carbohydrates or too many calories at one sitting increases the likelihood of GI side effects. [name] declined metformin for now and a prescription was not written today. We will recheck labs in 3 months and [name] agrees to follow up with our clinic in 2 to 3 weeks as directed to monitor her progress.  Diabetes risk counselling [name] was given extended (30 minutes) diabetes prevention counseling today. She is 62 y.o. female and has risk factors for diabetes including obesity and pre-diabetes. We discussed intensive lifestyle modifications today with an emphasis on weight loss as well as increasing exercise and decreasing simple carbohydrates in her diet.  Obesity [name] is currently in the action stage of change. As such, her goal is to continue with weight loss efforts She has agreed to follow the Category 3 plan [name] has been instructed to work up to a goal of 150 minutes of combined cardio and strengthening exercise per week for weight loss and overall health benefits. We discussed the following Behavioral Modification Strategies today: increasing lean protein intake, decreasing simple carbohydrates, work on meal planning and easy cooking plans, and better snacking choices   [name] has agreed to follow up with our clinic in 2 to 3 weeks. She was informed of the importance of frequent follow up visits to maximize her success with intensive lifestyle modifications for her multiple health conditions.   OBESITY BEHAVIORAL INTERVENTION VISIT  Today's visit was # 2 out of 22.  Starting weight: 266 lbs Starting date: 05/26/18 Today's weight : 262 lbs Today's date: 06/08/2018 Total lbs lost to date: 4 (Patients must lose 7 lbs in the first 6 months to continue with counseling)   ASK: We discussed the diagnosis of obesity with [name] today and [name] agreed to give [place] permission to discuss obesity behavioral modification  therapy today.  ASSESS: [name] has the diagnosis of obesity and her BMI today  is 44.95 [name] is in the action stage of change   ADVISE: [name] was educated on the multiple health risks of obesity as well as the benefit of weight loss to improve her health. She was advised of the need for long term treatment and the importance of lifestyle modifications.  AGREE: Multiple dietary modification options and treatment options were discussed and  [name] agreed to the above obesity treatment plan.  I, Trixie Dredge, am acting as transcriptionist for Dennard Nip, MD  I have reviewed the above documentation for accuracy and completeness, and I agree with the above. -Dennard Nip, MD

## 2018-06-29 ENCOUNTER — Encounter: Payer: Self-pay | Admitting: Internal Medicine

## 2018-06-29 ENCOUNTER — Ambulatory Visit: Payer: Managed Care, Other (non HMO) | Admitting: Internal Medicine

## 2018-06-29 DIAGNOSIS — J309 Allergic rhinitis, unspecified: Secondary | ICD-10-CM

## 2018-06-29 DIAGNOSIS — J4520 Mild intermittent asthma, uncomplicated: Secondary | ICD-10-CM | POA: Diagnosis not present

## 2018-06-29 DIAGNOSIS — I10 Essential (primary) hypertension: Secondary | ICD-10-CM

## 2018-06-29 DIAGNOSIS — Z6841 Body Mass Index (BMI) 40.0 and over, adult: Secondary | ICD-10-CM

## 2018-06-29 DIAGNOSIS — M150 Primary generalized (osteo)arthritis: Secondary | ICD-10-CM | POA: Diagnosis not present

## 2018-06-29 DIAGNOSIS — M159 Polyosteoarthritis, unspecified: Secondary | ICD-10-CM

## 2018-06-29 DIAGNOSIS — R739 Hyperglycemia, unspecified: Secondary | ICD-10-CM

## 2018-06-29 MED ORDER — FLUTICASONE FUROATE-VILANTEROL 100-25 MCG/INH IN AEPB: 1.0000 | INHALATION_SPRAY | Freq: Every day | RESPIRATORY_TRACT | 11 refills | Status: DC

## 2018-06-29 MED ORDER — MONTELUKAST SODIUM 10 MG PO TABS: 10.0000 mg | ORAL_TABLET | Freq: Every day | ORAL | 3 refills | Status: DC

## 2018-06-29 NOTE — Patient Instructions (Addendum)
Try Xyzal in place of Claritin 1 a day Try Singulair Try Flonase

## 2018-06-29 NOTE — Assessment & Plan Note (Addendum)
Claritin prn - not working well Try Xyzal in place of Claritin 1 a day

## 2018-06-29 NOTE — Assessment & Plan Note (Addendum)
Try Singulair, Memory Dance

## 2018-06-29 NOTE — Assessment & Plan Note (Signed)
Vit D 

## 2018-06-29 NOTE — Assessment & Plan Note (Addendum)
A1c 6.0% F/u w/Dr Leafy Ro

## 2018-06-29 NOTE — Progress Notes (Signed)
Subjective:  Patient ID: [name], female    DOB: [birth year]  Age: 62 y.o. MRN: [id number]  CC: No chief complaint on file.   HPI [name] presents for HTN, edema, stress at home w/mom C/o allergies - worse C/o wheezing - asthma when outside    Outpatient Medications Prior to Visit  Medication Sig Dispense Refill  . carvedilol (COREG) 25 MG tablet TAKE 1 TABLET BY MOUTH TWICE A DAY WITH A MEAL 180 tablet 3  . furosemide (LASIX) 20 MG tablet TAKE 1 TABLET BY MOUTH EVERY DAY AS NEEDED FOR EDEMA 90 tablet 3  . loratadine (CLARITIN) 10 MG tablet Take 1 tablet (10 mg total) by mouth daily. 100 tablet 3  . triamcinolone cream (KENALOG) 0.5 % Apply 1 application topically 3 (three) times daily. 120 g 3  . Vitamin D, Ergocalciferol, (DRISDOL) 50000 units CAPS capsule Take 1 capsule (50,000 Units total) by mouth every 7 (seven) days. 4 capsule 0   No facility-administered medications prior to visit.     ROS: Review of Systems  Constitutional: Negative for activity change, appetite change, chills, fatigue and unexpected weight change.  HENT: Positive for congestion, postnasal drip and rhinorrhea. Negative for mouth sores and sinus pressure.   Eyes: Positive for itching. Negative for visual disturbance.  Respiratory: Positive for wheezing. Negative for cough and chest tightness.   Gastrointestinal: Negative for abdominal pain and nausea.  Genitourinary: Negative for difficulty urinating, frequency and vaginal pain.  Musculoskeletal: Positive for arthralgias. Negative for back pain and gait problem.  Skin: Negative for pallor and rash.  Neurological: Negative for dizziness, tremors, weakness, numbness and headaches.  Psychiatric/Behavioral: Positive for dysphoric mood. Negative for confusion, sleep disturbance and suicidal ideas. The patient is nervous/anxious.     Objective:  BP 130/78 (BP Location: Left Arm, Patient Position: Sitting, Cuff Size: Large)   Pulse 67   Temp  98.2 F (36.8 C) (Oral)   Ht 5\' 4"  (1.626 m)   Wt 263 lb (119.3 kg)   SpO2 97%   BMI 45.14 kg/m   BP Readings from Last 3 Encounters:  06/29/18 130/78  06/08/18 132/79  05/26/18 (!) 144/79    Wt Readings from Last 3 Encounters:  06/29/18 263 lb (119.3 kg)  06/08/18 262 lb (118.8 kg)  05/26/18 266 lb (120.7 kg)    Physical Exam  Constitutional: She appears well-developed. No distress.  HENT:  Head: Normocephalic.  Right Ear: External ear normal.  Left Ear: External ear normal.  Nose: Nose normal.  Mouth/Throat: Oropharynx is clear and moist.  Eyes: Pupils are equal, round, and reactive to light. Conjunctivae are normal. Right eye exhibits no discharge. Left eye exhibits no discharge.  Neck: Normal range of motion. Neck supple. No JVD present. No tracheal deviation present. No thyromegaly present.  Cardiovascular: Normal rate, regular rhythm and normal heart sounds.  Pulmonary/Chest: No stridor. No respiratory distress. She has no wheezes.  Abdominal: Soft. Bowel sounds are normal. She exhibits no distension and no mass. There is no tenderness. There is no rebound and no guarding.  Musculoskeletal: She exhibits no edema or tenderness.  Lymphadenopathy:    She has no cervical adenopathy.  Neurological: She displays normal reflexes. No cranial nerve deficit. She exhibits normal muscle tone. Coordination normal.  Skin: No rash noted. No erythema.  Psychiatric: She has a normal mood and affect. Her behavior is normal. Judgment and thought content normal.  Obese  I personally provided Breo inhaler use teaching. After the teaching  patient was able to demonstrate it's use effectively. All questions were answered  Lab Results  Component Value Date   WBC 7.9 03/30/2018   HGB 13.0 03/30/2018   HCT 39.1 03/30/2018   PLT 223.0 03/30/2018   GLUCOSE 97 05/26/2018   CHOL 166 03/30/2018   TRIG 149.0 03/30/2018   HDL 37.90 (L) 03/30/2018   LDLCALC 99 03/30/2018   ALT 17 05/26/2018     AST 18 05/26/2018   NA 141 05/26/2018   K 4.3 05/26/2018   CL 103 05/26/2018   CREATININE 0.81 05/26/2018   BUN 14 05/26/2018   CO2 25 05/26/2018   TSH 0.96 03/30/2018   INR 1.09 07/13/2013   HGBA1C 6.0 (H) 05/26/2018    No results found.  Assessment & Plan:   There are no diagnoses linked to this encounter.   No orders of the defined types were placed in this encounter.    Follow-up: No follow-ups on file.  Walker Kehr, MD

## 2018-06-29 NOTE — Assessment & Plan Note (Addendum)
Wt Readings from Last 3 Encounters:  06/29/18 263 lb (119.3 kg)  06/08/18 262 lb (118.8 kg)  05/26/18 266 lb (120.7 kg)   F/u w/Dr Leafy Ro

## 2018-06-29 NOTE — Assessment & Plan Note (Signed)
  Coreg, Furosemide

## 2018-06-30 ENCOUNTER — Ambulatory Visit (INDEPENDENT_AMBULATORY_CARE_PROVIDER_SITE_OTHER): Payer: Managed Care, Other (non HMO) | Admitting: Family Medicine

## 2018-06-30 VITALS — BP 124/74 | HR 68 | Temp 98.0°F | Ht 64.0 in | Wt 258.0 lb

## 2018-06-30 DIAGNOSIS — E559 Vitamin D deficiency, unspecified: Secondary | ICD-10-CM

## 2018-06-30 DIAGNOSIS — Z9189 Other specified personal risk factors, not elsewhere classified: Secondary | ICD-10-CM

## 2018-06-30 DIAGNOSIS — Z6841 Body Mass Index (BMI) 40.0 and over, adult: Secondary | ICD-10-CM | POA: Diagnosis not present

## 2018-06-30 MED ORDER — VITAMIN D (ERGOCALCIFEROL) 1.25 MG (50000 UNIT) PO CAPS: 50000.0000 [IU] | ORAL_CAPSULE | ORAL | 0 refills | Status: DC

## 2018-07-01 NOTE — Progress Notes (Signed)
Office: [phone]  /  Fax: [phone]   HPI:   Chief Complaint: OBESITY [name] is here to discuss her progress with her obesity treatment plan. She is on the Category 3 plan and is following her eating plan approximately 95 % of the time. She states she is walking on the treadmill 20 minutes 3 times per week. [name] continues to do very well with weight loss on her Category 3 plan. She is trying hard to make good choices, but she really misses some foods and wants to know how she can work some in. Her weight is 258 lb (117 kg) today and has had a weight loss of 4 pounds over a period of 3 weeks since her last visit. She has lost 8 lbs since starting treatment with [place].  Vitamin D deficiency [name] has a diagnosis of vitamin D deficiency. She is stable on vit D but she is not yet at goal. [name] denies nausea, vomiting or muscle weakness.  At risk for cardiovascular disease [name] is at a higher than average risk for cardiovascular disease due to obesity. She currently denies any chest pain.  ALLERGIES: Allergies  Allergen Reactions  . Hydrochlorothiazide     REACTION: cramps  . Phentermine Hcl     REACTION: dizzy  . Valsartan     REACTION: side effect- lightheadedness  . Verapamil     REACTION: ? lightheaded    MEDICATIONS: Current Outpatient Medications on File Prior to Visit  Medication Sig Dispense Refill  . carvedilol (COREG) 25 MG tablet TAKE 1 TABLET BY MOUTH TWICE A DAY WITH A MEAL 180 tablet 3  . fluticasone furoate-vilanterol (BREO ELLIPTA) 100-25 MCG/INH AEPB Inhale 1 puff into the lungs daily. 1 each 11  . furosemide (LASIX) 20 MG tablet TAKE 1 TABLET BY MOUTH EVERY DAY AS NEEDED FOR EDEMA 90 tablet 3  . loratadine (CLARITIN) 10 MG tablet Take 1 tablet (10 mg total) by mouth daily. 100 tablet 3  . montelukast (SINGULAIR) 10 MG tablet Take 1 tablet (10 mg total) by mouth daily. 90 tablet 3  . triamcinolone cream (KENALOG) 0.5 % Apply 1 application topically  3 (three) times daily. 120 g 3   No current facility-administered medications on file prior to visit.     PAST MEDICAL HISTORY: Past Medical History:  Diagnosis Date  . Abnormal glucose 2009  . Allergic rhinitis   . Back pain   . COPD (chronic obstructive pulmonary disease) ([place])   . Dyspnea   . Food allergy   . GERD (gastroesophageal reflux disease)   . Headache    Migraines (rare)  . HTN (hypertension)   . Lactose intolerance   . Leg edema   . Osteoarthritis    Right Hip - SEVERE    PAST SURGICAL HISTORY: Past Surgical History:  Procedure Laterality Date  . CHOLECYSTECTOMY    . COLONOSCOPY    . hole repair in heart     2004 at [place]   . HYSTEROSCOPY W/D&C N/A 04/04/2016   Procedure: DILATATION AND CURETTAGE /HYSTEROSCOPY, POLYPECTOMY WITH MYOSURE;  Surgeon: Olga Millers, MD;  Location: [facility];  Service: Gynecology;  Laterality: N/A;  . KNEE ARTHROSCOPY     left   . TOTAL HIP ARTHROPLASTY Right 07/20/2013   Procedure: RIGHT TOTAL HIP ARTHROPLASTY ANTERIOR APPROACH;  Surgeon: Mauri Pole, MD;  Location: [facility];  Service: Orthopedics;  Laterality: Right;  . TUBAL LIGATION    . WISDOM TOOTH EXTRACTION      SOCIAL  HISTORY: Social History   Tobacco Use  . Smoking status: Former Smoker    Packs/day: 0.50    Years: 40.00    Pack years: 20.00    Types: Cigarettes    Last attempt to quit: 12/09/2011    Years since quitting: 6.5  . Smokeless tobacco: Never Used  . Tobacco comment: 2009  Substance Use Topics  . Alcohol use: Yes    Alcohol/week: 4.2 oz    Types: 7 Glasses of wine per week    Comment: 1 glass of wine nightly  . Drug use: No    FAMILY HISTORY: Family History  Problem Relation Age of Onset  . Parkinsonism Mother   . Hypertension Mother   . Hyperlipidemia Mother   . Hypertension Unknown     ROS: Review of Systems  Constitutional: Positive for weight loss.  Cardiovascular: Negative for chest pain.  Gastrointestinal: Negative for nausea  and vomiting.  Musculoskeletal:       Negative for muscle weakness    PHYSICAL EXAM: Blood pressure 124/74, pulse 68, temperature 98 F (36.7 C), temperature source Oral, height 5\' 4"  (1.626 m), weight 258 lb (117 kg), SpO2 97 %. Body mass index is 44.29 kg/m. Physical Exam  Constitutional: She is oriented to person, place, and time. She appears well-developed and well-nourished.  Cardiovascular: Normal rate.  Pulmonary/Chest: Effort normal.  Musculoskeletal: Normal range of motion.  Neurological: She is oriented to person, place, and time.  Skin: Skin is warm and dry.  Psychiatric: She has a normal mood and affect. Her behavior is normal.  Vitals reviewed.   RECENT LABS AND TESTS: BMET    Component Value Date/Time   NA 141 05/26/2018 0000   K 4.3 05/26/2018 0000   CL 103 05/26/2018 0000   CO2 25 05/26/2018 0000   GLUCOSE 97 05/26/2018 0000   GLUCOSE 115 (H) 03/30/2018 1642   BUN 14 05/26/2018 0000   CREATININE 0.81 05/26/2018 0000   CALCIUM 9.2 05/26/2018 0000   GFRNONAA 78 05/26/2018 0000   GFRAA 89 05/26/2018 0000   Lab Results  Component Value Date   HGBA1C 6.0 (H) 05/26/2018   HGBA1C 5.9 08/28/2015   HGBA1C 6.3 10/26/2010   HGBA1C 6.3 07/25/2010   HGBA1C 6.1 (H) 01/27/2009   Lab Results  Component Value Date   INSULIN 10.1 05/26/2018   CBC    Component Value Date/Time   WBC 7.9 03/30/2018 1642   RBC 4.36 03/30/2018 1642   HGB 13.0 03/30/2018 1642   HCT 39.1 03/30/2018 1642   PLT 223.0 03/30/2018 1642   MCV 89.6 03/30/2018 1642   MCH 30.3 03/26/2016 0905   MCHC 33.4 03/30/2018 1642   RDW 14.0 03/30/2018 1642   LYMPHSABS 1.7 03/30/2018 1642   MONOABS 0.8 03/30/2018 1642   EOSABS 0.4 03/30/2018 1642   BASOSABS 0.1 03/30/2018 1642   Iron/TIBC/Ferritin/ %Sat No results found for: IRON, TIBC, FERRITIN, IRONPCTSAT Lipid Panel     Component Value Date/Time   CHOL 166 03/30/2018 1642   TRIG 149.0 03/30/2018 1642   HDL 37.90 (L) 03/30/2018 1642    CHOLHDL 4 03/30/2018 1642   VLDL 29.8 03/30/2018 1642   LDLCALC 99 03/30/2018 1642   Hepatic Function Panel     Component Value Date/Time   PROT 6.5 05/26/2018 0000   ALBUMIN 3.9 05/26/2018 0000   AST 18 05/26/2018 0000   ALT 17 05/26/2018 0000   ALKPHOS 57 05/26/2018 0000   BILITOT 0.3 05/26/2018 0000   BILIDIR 0.1 03/30/2018 1642  Component Value Date/Time   TSH 0.96 03/30/2018 1642   TSH 1.19 03/03/2017 1122   TSH 0.90 08/28/2015 1634   Results for ZANA, BIANCARDI (MRN [id number]) as of 07/01/2018 08:53  Ref. Range 05/26/2018 00:00  Vitamin D, 25-Hydroxy Latest Ref Range: 30.0 - 100.0 ng/mL 37.5   ASSESSMENT AND PLAN: Vitamin D deficiency - Plan: Vitamin D, Ergocalciferol, (DRISDOL) 50000 units CAPS capsule  At risk for heart disease  Class 3 severe obesity with serious comorbidity and body mass index (BMI) of 40.0 to 44.9 in adult, unspecified obesity type ([place])  PLAN:  Vitamin D Deficiency Aryn was informed that low vitamin D levels contributes to fatigue and are associated with obesity, breast, and colon cancer. She agrees to continue to take prescription Vit D @50 ,000 IU every week #4 with no refills and will follow up for routine testing of vitamin D, at least 2-3 times per year. She was informed of the risk of over-replacement of vitamin D and agrees to not increase her dose unless she discusses this with [place] first. [name] agrees to follow up as directed.  Cardiovascular risk counseling Marquette was given extended (15 minutes) coronary artery disease prevention counseling today. She is 62 y.o. female and has risk factors for heart disease including obesity. We discussed intensive lifestyle modifications today with an emphasis on specific weight loss instructions and strategies. Pt was also informed of the importance of increasing exercise and decreasing saturated fats to help prevent heart disease.  Obesity [name] is currently in the action stage of change. As  such, her goal is to continue with weight loss efforts She has agreed to keep a food journal with 400 to 550 calories and 40 grams of protein at supper daily and follow the Category 3 plan [name] has been instructed to work up to a goal of 150 minutes of combined cardio and strengthening exercise per week for weight loss and overall health benefits. We discussed the following Behavioral Modification Strategies today: increasing lean protein intake, decreasing simple carbohydrates  and work on meal planning and easy cooking plans  [name] has agreed to follow up with our clinic in 2 to 3 weeks. She was informed of the importance of frequent follow up visits to maximize her success with intensive lifestyle modifications for her multiple health conditions.   OBESITY BEHAVIORAL INTERVENTION VISIT  Today's visit was # 3 out of 22.  Starting weight: 266 lbs Starting date: 05/26/18 Today's weight : 258 lbs  Today's date: 06/30/2018 Total lbs lost to date: 8    ASK: We discussed the diagnosis of obesity with [name] today and Keilana agreed to give [place] permission to discuss obesity behavioral modification therapy today.  ASSESS: Emaley has the diagnosis of obesity and her BMI today is 44.26 Ivon is in the action stage of change   ADVISE: Delany was educated on the multiple health risks of obesity as well as the benefit of weight loss to improve her health. She was advised of the need for long term treatment and the importance of lifestyle modifications.  AGREE: Multiple dietary modification options and treatment options were discussed and  Shaliah agreed to the above obesity treatment plan.  I, Doreene Nest, am acting as transcriptionist for Dennard Nip, MD  I have reviewed the above documentation for accuracy and completeness, and I agree with the above. -Dennard Nip, MD

## 2018-07-21 ENCOUNTER — Ambulatory Visit (INDEPENDENT_AMBULATORY_CARE_PROVIDER_SITE_OTHER): Payer: Managed Care, Other (non HMO) | Admitting: Family Medicine

## 2018-07-21 VITALS — BP 138/77 | HR 65 | Temp 98.4°F | Ht 64.0 in | Wt 257.0 lb

## 2018-07-21 DIAGNOSIS — Z9189 Other specified personal risk factors, not elsewhere classified: Secondary | ICD-10-CM | POA: Diagnosis not present

## 2018-07-21 DIAGNOSIS — Z6841 Body Mass Index (BMI) 40.0 and over, adult: Secondary | ICD-10-CM | POA: Diagnosis not present

## 2018-07-21 DIAGNOSIS — E559 Vitamin D deficiency, unspecified: Secondary | ICD-10-CM | POA: Diagnosis not present

## 2018-07-21 MED ORDER — VITAMIN D (ERGOCALCIFEROL) 1.25 MG (50000 UNIT) PO CAPS: 50000.0000 [IU] | ORAL_CAPSULE | ORAL | 0 refills | Status: DC

## 2018-07-22 NOTE — Progress Notes (Signed)
Office: [phone]  /  Fax: [phone]   HPI:   Chief Complaint: OBESITY [name] is here to discuss her progress with her obesity treatment plan. She is on the  keep a food journal with 400 to 550 calories and 40 grams of protein at supper daily and follow the Category 3 plan and is following her eating plan approximately 85 to 90 % of the time. She states she is doing squats, left lifts and walking on the treadmill 20 minutes 3 to 4 times per week. [name] has struggled more in the last two weeks after the sudden death of her friend. She is ready to get back to her category 2 plan. She has started back to exercising and is planning on going back to the gym. Her weight is 257 lb (116.6 kg) today and has had a weight loss of 1 pound over a period of 3 weeks since her last visit. She has lost 9 lbs since starting treatment with [place].  Vitamin D deficiency [name] has a diagnosis of vitamin D deficiency. [name] is stable on vit D, but she is not yet at goal. She denies nausea, vomiting or muscle weakness.  At risk for osteopenia and osteoporosis [name] is at higher risk of osteopenia and osteoporosis due to vitamin D deficiency.   ALLERGIES: Allergies  Allergen Reactions  . Hydrochlorothiazide     REACTION: cramps  . Phentermine Hcl     REACTION: dizzy  . Valsartan     REACTION: side effect- lightheadedness  . Verapamil     REACTION: ? lightheaded    MEDICATIONS: Current Outpatient Medications on File Prior to Visit  Medication Sig Dispense Refill  . carvedilol (COREG) 25 MG tablet TAKE 1 TABLET BY MOUTH TWICE A DAY WITH A MEAL 180 tablet 3  . fluticasone furoate-vilanterol (BREO ELLIPTA) 100-25 MCG/INH AEPB Inhale 1 puff into the lungs daily. 1 each 11  . furosemide (LASIX) 20 MG tablet TAKE 1 TABLET BY MOUTH EVERY DAY AS NEEDED FOR EDEMA 90 tablet 3  . loratadine (CLARITIN) 10 MG tablet Take 1 tablet (10 mg total) by mouth daily. 100 tablet 3  . montelukast (SINGULAIR) 10  MG tablet Take 1 tablet (10 mg total) by mouth daily. 90 tablet 3  . triamcinolone cream (KENALOG) 0.5 % Apply 1 application topically 3 (three) times daily. 120 g 3   No current facility-administered medications on file prior to visit.     PAST MEDICAL HISTORY: Past Medical History:  Diagnosis Date  . Abnormal glucose 2009  . Allergic rhinitis   . Back pain   . COPD (chronic obstructive pulmonary disease) ([place])   . Dyspnea   . Food allergy   . GERD (gastroesophageal reflux disease)   . Headache    Migraines (rare)  . HTN (hypertension)   . Lactose intolerance   . Leg edema   . Osteoarthritis    Right Hip - SEVERE    PAST SURGICAL HISTORY: Past Surgical History:  Procedure Laterality Date  . CHOLECYSTECTOMY    . COLONOSCOPY    . hole repair in heart     2004 at [place]   . HYSTEROSCOPY W/D&C N/A 04/04/2016   Procedure: DILATATION AND CURETTAGE /HYSTEROSCOPY, POLYPECTOMY WITH MYOSURE;  Surgeon: Olga Millers, MD;  Location: [facility];  Service: Gynecology;  Laterality: N/A;  . KNEE ARTHROSCOPY     left   . TOTAL HIP ARTHROPLASTY Right 07/20/2013   Procedure: RIGHT TOTAL HIP ARTHROPLASTY ANTERIOR APPROACH;  Surgeon: Rodman Key  Marian Sorrow, MD;  Location: [facility];  Service: Orthopedics;  Laterality: Right;  . TUBAL LIGATION    . WISDOM TOOTH EXTRACTION      SOCIAL HISTORY: Social History   Tobacco Use  . Smoking status: Former Smoker    Packs/day: 0.50    Years: 40.00    Pack years: 20.00    Types: Cigarettes    Last attempt to quit: 12/09/2011    Years since quitting: 6.6  . Smokeless tobacco: Never Used  . Tobacco comment: 2009  Substance Use Topics  . Alcohol use: Yes    Alcohol/week: 7.0 standard drinks    Types: 7 Glasses of wine per week    Comment: 1 glass of wine nightly  . Drug use: No    FAMILY HISTORY: Family History  Problem Relation Age of Onset  . Parkinsonism Mother   . Hypertension Mother   . Hyperlipidemia Mother   . Hypertension Unknown      ROS: Review of Systems  Constitutional: Positive for weight loss.  Gastrointestinal: Negative for nausea and vomiting.  Musculoskeletal:       Negative for muscle weakness    PHYSICAL EXAM: Blood pressure 138/77, pulse 65, temperature 98.4 F (36.9 C), temperature source Oral, height 5\' 4"  (1.626 m), weight 257 lb (116.6 kg), SpO2 98 %. Body mass index is 44.11 kg/m. Physical Exam  Constitutional: She is oriented to person, place, and time. She appears well-developed and well-nourished.  Cardiovascular: Normal rate.  Pulmonary/Chest: Effort normal.  Musculoskeletal: Normal range of motion.  Neurological: She is oriented to person, place, and time.  Skin: Skin is warm and dry.  Psychiatric: She has a normal mood and affect. Her behavior is normal.  Vitals reviewed.   RECENT LABS AND TESTS: BMET    Component Value Date/Time   NA 141 05/26/2018 0000   K 4.3 05/26/2018 0000   CL 103 05/26/2018 0000   CO2 25 05/26/2018 0000   GLUCOSE 97 05/26/2018 0000   GLUCOSE 115 (H) 03/30/2018 1642   BUN 14 05/26/2018 0000   CREATININE 0.81 05/26/2018 0000   CALCIUM 9.2 05/26/2018 0000   GFRNONAA 78 05/26/2018 0000   GFRAA 89 05/26/2018 0000   Lab Results  Component Value Date   HGBA1C 6.0 (H) 05/26/2018   HGBA1C 5.9 08/28/2015   HGBA1C 6.3 10/26/2010   HGBA1C 6.3 07/25/2010   HGBA1C 6.1 (H) 01/27/2009   Lab Results  Component Value Date   INSULIN 10.1 05/26/2018   CBC    Component Value Date/Time   WBC 7.9 03/30/2018 1642   RBC 4.36 03/30/2018 1642   HGB 13.0 03/30/2018 1642   HCT 39.1 03/30/2018 1642   PLT 223.0 03/30/2018 1642   MCV 89.6 03/30/2018 1642   MCH 30.3 03/26/2016 0905   MCHC 33.4 03/30/2018 1642   RDW 14.0 03/30/2018 1642   LYMPHSABS 1.7 03/30/2018 1642   MONOABS 0.8 03/30/2018 1642   EOSABS 0.4 03/30/2018 1642   BASOSABS 0.1 03/30/2018 1642   Iron/TIBC/Ferritin/ %Sat No results found for: IRON, TIBC, FERRITIN, IRONPCTSAT Lipid Panel      Component Value Date/Time   CHOL 166 03/30/2018 1642   TRIG 149.0 03/30/2018 1642   HDL 37.90 (L) 03/30/2018 1642   CHOLHDL 4 03/30/2018 1642   VLDL 29.8 03/30/2018 1642   LDLCALC 99 03/30/2018 1642   Hepatic Function Panel     Component Value Date/Time   PROT 6.5 05/26/2018 0000   ALBUMIN 3.9 05/26/2018 0000   AST 18 05/26/2018  0000   ALT 17 05/26/2018 0000   ALKPHOS 57 05/26/2018 0000   BILITOT 0.3 05/26/2018 0000   BILIDIR 0.1 03/30/2018 1642      Component Value Date/Time   TSH 0.96 03/30/2018 1642   TSH 1.19 03/03/2017 1122   TSH 0.90 08/28/2015 1634   Results for CHABELI, BARSAMIAN (MRN [id number]) as of 07/22/2018 07:23  Ref. Range 05/26/2018 00:00  Vitamin D, 25-Hydroxy Latest Ref Range: 30.0 - 100.0 ng/mL 37.5   ASSESSMENT AND PLAN: Vitamin D deficiency - Plan: Vitamin D, Ergocalciferol, (DRISDOL) 50000 units CAPS capsule  At risk for osteoporosis  Class 3 severe obesity with serious comorbidity and body mass index (BMI) of 40.0 to 44.9 in adult, unspecified obesity type ([place])  PLAN:  Vitamin D Deficiency Lareta was informed that low vitamin D levels contributes to fatigue and are associated with obesity, breast, and colon cancer. She agrees to continue to take prescription Vit D @50 ,000 IU every week #4 with no refills and will follow up for routine testing of vitamin D, at least 2-3 times per year. She was informed of the risk of over-replacement of vitamin D and agrees to not increase her dose unless she discusses this with [place] first. Jisselle agrees to follow up as directed.  At risk for osteopenia and osteoporosis Prescious is at risk for osteopenia and osteoporosis due to her vitamin D deficiency. She was encouraged to take her vitamin D and follow her higher calcium diet and increase strengthening exercise to help strengthen her bones and decrease her risk of osteopenia and osteoporosis.  Obesity Isatou is currently in the action stage of change. As such, her  goal is to continue with weight loss efforts She has agreed to keep a food journal with 400 to 550 calories and 40 grams of protein at supper daily and follow the Category 3 plan Tameaka has been instructed to work up to a goal of 150 minutes of combined cardio and strengthening exercise per week for weight loss and overall health benefits. We discussed the following Behavioral Modification Strategies today: increasing lean protein intake, decreasing simple carbohydrates  and work on meal planning and easy cooking plans  Zadie has agreed to follow up with our clinic in 2 to 3 weeks. She was informed of the importance of frequent follow up visits to maximize her success with intensive lifestyle modifications for her multiple health conditions.   OBESITY BEHAVIORAL INTERVENTION VISIT  Today's visit was # 4 out of 22.  Starting weight: 266 lbs Starting date: 05/26/18 Today's weight : 257 lbs Today's date: 07/21/2018 Total lbs lost to date: 9    ASK: We discussed the diagnosis of obesity with Jeanella Anton today and Cherice agreed to give [place] permission to discuss obesity behavioral modification therapy today.  ASSESS: Donnis has the diagnosis of obesity and her BMI today is 44.09 Seraphine is in the action stage of change   ADVISE: Morissa was educated on the multiple health risks of obesity as well as the benefit of weight loss to improve her health. She was advised of the need for long term treatment and the importance of lifestyle modifications.  AGREE: Multiple dietary modification options and treatment options were discussed and  Samella agreed to the above obesity treatment plan.  I, Doreene Nest, am acting as transcriptionist for Dennard Nip, MD  I have reviewed the above documentation for accuracy and completeness, and I agree with the above. -Dennard Nip, MD

## 2018-08-06 ENCOUNTER — Ambulatory Visit (INDEPENDENT_AMBULATORY_CARE_PROVIDER_SITE_OTHER): Payer: Managed Care, Other (non HMO) | Admitting: Family Medicine

## 2018-08-06 VITALS — BP 126/77 | HR 68 | Temp 97.6°F | Ht 64.0 in | Wt 256.0 lb

## 2018-08-06 DIAGNOSIS — Z6841 Body Mass Index (BMI) 40.0 and over, adult: Secondary | ICD-10-CM

## 2018-08-06 DIAGNOSIS — Z9189 Other specified personal risk factors, not elsewhere classified: Secondary | ICD-10-CM | POA: Diagnosis not present

## 2018-08-06 DIAGNOSIS — E66813 Obesity, class 3: Secondary | ICD-10-CM

## 2018-08-06 DIAGNOSIS — I10 Essential (primary) hypertension: Secondary | ICD-10-CM | POA: Diagnosis not present

## 2018-08-06 DIAGNOSIS — F3289 Other specified depressive episodes: Secondary | ICD-10-CM

## 2018-08-06 MED ORDER — BUPROPION HCL ER (SR) 150 MG PO TB12
150.0000 mg | ORAL_TABLET | Freq: Every day | ORAL | 0 refills | Status: DC
Start: 2018-08-06 — End: 2018-08-31

## 2018-08-07 NOTE — Progress Notes (Signed)
Office: [phone]  /  Fax: [phone]   HPI:   Chief Complaint: OBESITY [name] is here to discuss her progress with her obesity treatment plan. She is on the keep a food journal with 400-550 calories and 40 grams of protein at supper daily and follow the Category 3 plan and is following her eating plan approximately 90 % of the time. She states she is walking for 30 minutes 1-2 times per week. [name] continues to lose weight slowly but continues to struggle with emotional eating.  Her weight is 256 lb (116.1 kg) today and has had a weight loss of 1 pound over a period of 2 to 3 weeks since her last visit. She has lost 10 lbs since starting treatment with [place].  Hypertension MAKENZE ELLETT is a 62 y.o. female with hypertension. [name]'s blood pressure is well controlled on medications. She is starting Wellbutrin and was worried that her blood pressure may increase. She denies chest pain. She is working weight loss to help control her blood pressure with the goal of decreasing her risk of heart attack and stroke.   At risk for cardiovascular disease [name] is at a higher than average risk for cardiovascular disease due to obesity and hypertension. She currently denies any chest pain.  Depression with emotional eating behaviors [name]'s mood has decreased recently, she notes decreased activity and increased emotional eating. [name] struggles with emotional eating and using food for comfort to the extent that it is negatively impacting her health. She often snacks when she is not hungry. [name] sometimes feels she is out of control and then feels guilty that she made poor food choices. She has been working on behavior modification techniques to help reduce her emotional eating and has been somewhat successful. She shows no sign of suicidal or homicidal ideations.  Depression screen [employer] 2/9 05/26/2018 03/30/2018 03/03/2017  Decreased Interest 3 0 0  Down, Depressed, Hopeless 2 0 0  PHQ -  2 Score 5 0 0  Altered sleeping 1 - -  Tired, decreased energy 3 - -  Change in appetite 0 - -  Feeling bad or failure about yourself  0 - -  Trouble concentrating 0 - -  Moving slowly or fidgety/restless 0 - -  Suicidal thoughts 0 - -  PHQ-9 Score 9 - -  Difficult doing work/chores Somewhat difficult - -    ALLERGIES: Allergies  Allergen Reactions  . Hydrochlorothiazide     REACTION: cramps  . Phentermine Hcl     REACTION: dizzy  . Valsartan     REACTION: side effect- lightheadedness  . Verapamil     REACTION: ? lightheaded    MEDICATIONS: Current Outpatient Medications on File Prior to Visit  Medication Sig Dispense Refill  . carvedilol (COREG) 25 MG tablet TAKE 1 TABLET BY MOUTH TWICE A DAY WITH A MEAL 180 tablet 3  . fluticasone furoate-vilanterol (BREO ELLIPTA) 100-25 MCG/INH AEPB Inhale 1 puff into the lungs daily. 1 each 11  . furosemide (LASIX) 20 MG tablet TAKE 1 TABLET BY MOUTH EVERY DAY AS NEEDED FOR EDEMA 90 tablet 3  . loratadine (CLARITIN) 10 MG tablet Take 1 tablet (10 mg total) by mouth daily. 100 tablet 3  . montelukast (SINGULAIR) 10 MG tablet Take 1 tablet (10 mg total) by mouth daily. 90 tablet 3  . triamcinolone cream (KENALOG) 0.5 % Apply 1 application topically 3 (three) times daily. 120 g 3  . Vitamin D, Ergocalciferol, (DRISDOL) 50000 units CAPS capsule Take  1 capsule (50,000 Units total) by mouth every 7 (seven) days. 4 capsule 0   No current facility-administered medications on file prior to visit.     PAST MEDICAL HISTORY: Past Medical History:  Diagnosis Date  . Abnormal glucose 2009  . Allergic rhinitis   . Back pain   . COPD (chronic obstructive pulmonary disease) ([place])   . Dyspnea   . Food allergy   . GERD (gastroesophageal reflux disease)   . Headache    Migraines (rare)  . HTN (hypertension)   . Lactose intolerance   . Leg edema   . Osteoarthritis    Right Hip - SEVERE    PAST SURGICAL HISTORY: Past Surgical History:    Procedure Laterality Date  . CHOLECYSTECTOMY    . COLONOSCOPY    . hole repair in heart     2004 at [place]   . HYSTEROSCOPY W/D&C N/A 04/04/2016   Procedure: DILATATION AND CURETTAGE /HYSTEROSCOPY, POLYPECTOMY WITH MYOSURE;  Surgeon: Olga Millers, MD;  Location: [facility];  Service: Gynecology;  Laterality: N/A;  . KNEE ARTHROSCOPY     left   . TOTAL HIP ARTHROPLASTY Right 07/20/2013   Procedure: RIGHT TOTAL HIP ARTHROPLASTY ANTERIOR APPROACH;  Surgeon: Mauri Pole, MD;  Location: [facility];  Service: Orthopedics;  Laterality: Right;  . TUBAL LIGATION    . WISDOM TOOTH EXTRACTION      SOCIAL HISTORY: Social History   Tobacco Use  . Smoking status: Former Smoker    Packs/day: 0.50    Years: 40.00    Pack years: 20.00    Types: Cigarettes    Last attempt to quit: 12/09/2011    Years since quitting: 6.6  . Smokeless tobacco: Never Used  . Tobacco comment: 2009  Substance Use Topics  . Alcohol use: Yes    Alcohol/week: 7.0 standard drinks    Types: 7 Glasses of wine per week    Comment: 1 glass of wine nightly  . Drug use: No    FAMILY HISTORY: Family History  Problem Relation Age of Onset  . Parkinsonism Mother   . Hypertension Mother   . Hyperlipidemia Mother   . Hypertension Unknown     ROS: Review of Systems  Constitutional: Positive for weight loss.  Cardiovascular: Negative for chest pain.  Psychiatric/Behavioral: Positive for depression. Negative for suicidal ideas.    PHYSICAL EXAM: Blood pressure 126/77, pulse 68, temperature 97.6 F (36.4 C), temperature source Oral, height 5\' 4"  (1.626 m), weight 256 lb (116.1 kg), SpO2 96 %. Body mass index is 43.94 kg/m. Physical Exam  Constitutional: She is oriented to person, place, and time. She appears well-developed and well-nourished.  Cardiovascular: Normal rate.  Pulmonary/Chest: Effort normal.  Musculoskeletal: Normal range of motion.  Neurological: She is oriented to person, place, and time.  Skin: Skin  is warm and dry.  Psychiatric: She has a normal mood and affect. Her behavior is normal.  Vitals reviewed.   RECENT LABS AND TESTS: BMET    Component Value Date/Time   NA 141 05/26/2018 0000   K 4.3 05/26/2018 0000   CL 103 05/26/2018 0000   CO2 25 05/26/2018 0000   GLUCOSE 97 05/26/2018 0000   GLUCOSE 115 (H) 03/30/2018 1642   BUN 14 05/26/2018 0000   CREATININE 0.81 05/26/2018 0000   CALCIUM 9.2 05/26/2018 0000   GFRNONAA 78 05/26/2018 0000   GFRAA 89 05/26/2018 0000   Lab Results  Component Value Date   HGBA1C 6.0 (H) 05/26/2018  HGBA1C 5.9 08/28/2015   HGBA1C 6.3 10/26/2010   HGBA1C 6.3 07/25/2010   HGBA1C 6.1 (H) 01/27/2009   Lab Results  Component Value Date   INSULIN 10.1 05/26/2018   CBC    Component Value Date/Time   WBC 7.9 03/30/2018 1642   RBC 4.36 03/30/2018 1642   HGB 13.0 03/30/2018 1642   HCT 39.1 03/30/2018 1642   PLT 223.0 03/30/2018 1642   MCV 89.6 03/30/2018 1642   MCH 30.3 03/26/2016 0905   MCHC 33.4 03/30/2018 1642   RDW 14.0 03/30/2018 1642   LYMPHSABS 1.7 03/30/2018 1642   MONOABS 0.8 03/30/2018 1642   EOSABS 0.4 03/30/2018 1642   BASOSABS 0.1 03/30/2018 1642   Iron/TIBC/Ferritin/ %Sat No results found for: IRON, TIBC, FERRITIN, IRONPCTSAT Lipid Panel     Component Value Date/Time   CHOL 166 03/30/2018 1642   TRIG 149.0 03/30/2018 1642   HDL 37.90 (L) 03/30/2018 1642   CHOLHDL 4 03/30/2018 1642   VLDL 29.8 03/30/2018 1642   LDLCALC 99 03/30/2018 1642   Hepatic Function Panel     Component Value Date/Time   PROT 6.5 05/26/2018 0000   ALBUMIN 3.9 05/26/2018 0000   AST 18 05/26/2018 0000   ALT 17 05/26/2018 0000   ALKPHOS 57 05/26/2018 0000   BILITOT 0.3 05/26/2018 0000   BILIDIR 0.1 03/30/2018 1642      Component Value Date/Time   TSH 0.96 03/30/2018 1642   TSH 1.19 03/03/2017 1122   TSH 0.90 08/28/2015 1634    ASSESSMENT AND PLAN: Essential hypertension  Other depression - with emotional eating - Plan:  buPROPion (WELLBUTRIN SR) 150 MG 12 hr tablet  At risk for heart disease  Class 3 severe obesity with serious comorbidity and body mass index (BMI) of 40.0 to 44.9 in adult, unspecified obesity type ([place])  PLAN:  Hypertension We discussed sodium restriction, working on healthy weight loss, and a regular exercise program as the means to achieve improved blood pressure control. Luna agreed with this plan and agreed to follow up as directed. We will continue to monitor her blood pressure as well as her progress with the above lifestyle modifications. She will continue her medications and will watch for signs of hypotension as she continues her lifestyle modifications. Jakiera agrees to follow up with our clinic in 2 to 3 weeks and we will recheck blood pressure at that time.  Cardiovascular risk counselling Ulla was given extended (15 minutes) coronary artery disease prevention counseling today. She is 62 y.o. female and has risk factors for heart disease including obesity and hypertension. We discussed intensive lifestyle modifications today with an emphasis on specific weight loss instructions and strategies. Pt was also informed of the importance of increasing exercise and decreasing saturated fats to help prevent heart disease.  Depression with Emotional Eating Behaviors We discussed behavior modification techniques today to help Tyara deal with her emotional eating and depression. Karyss agrees to start Wellbutrin SR 150 mg q AM #30 with no refills. Avonelle agrees to follow up with our clinic in 2 to 3 weeks.  Obesity Latrisa is currently in the action stage of change. As such, her goal is to continue with weight loss efforts She has agreed to keep a food journal with 400-550 calories and 40 grams of protein at supper daily and follow the Category 3 plan Michille has been instructed to work up to a goal of 150 minutes of combined cardio and strengthening exercise per week for weight  loss and overall health  benefits. We discussed the following Behavioral Modification Strategies today: increasing lean protein intake, holiday eating strategies  and emotional eating strategies   Anjana has agreed to follow up with our clinic in 2 to 3 weeks. She was informed of the importance of frequent follow up visits to maximize her success with intensive lifestyle modifications for her multiple health conditions.   OBESITY BEHAVIORAL INTERVENTION VISIT  Today's visit was # 5   Starting weight: 266 lbs Starting date: 05/26/18 Today's weight : 256 lbs  Today's date: 08/06/2018 Total lbs lost to date: 10  ASK: We discussed the diagnosis of obesity with Jeanella Anton today and Isamar agreed to give [place] permission to discuss obesity behavioral modification therapy today.  ASSESS: Autym has the diagnosis of obesity and her BMI today is 43.92 Tris is in the action stage of change   ADVISE: Felisa was educated on the multiple health risks of obesity as well as the benefit of weight loss to improve her health. She was advised of the need for long term treatment and the importance of lifestyle modifications to improve her current health and to decrease her risk of future health problems.  AGREE: Multiple dietary modification options and treatment options were discussed and  Tyannah agreed to follow the recommendations documented in the above note.  ARRANGE: Marionna was educated on the importance of frequent visits to treat obesity as outlined per CMS and USPSTF guidelines and agreed to schedule her next follow up appointment today.  I, Trixie Dredge, am acting as transcriptionist for Dennard Nip, MD  I have reviewed the above documentation for accuracy and completeness, and I agree with the above. -Dennard Nip, MD

## 2018-08-25 ENCOUNTER — Other Ambulatory Visit (INDEPENDENT_AMBULATORY_CARE_PROVIDER_SITE_OTHER): Payer: Self-pay | Admitting: Family Medicine

## 2018-08-25 DIAGNOSIS — E559 Vitamin D deficiency, unspecified: Secondary | ICD-10-CM

## 2018-08-27 ENCOUNTER — Other Ambulatory Visit (INDEPENDENT_AMBULATORY_CARE_PROVIDER_SITE_OTHER): Payer: Self-pay | Admitting: Family Medicine

## 2018-08-27 DIAGNOSIS — F3289 Other specified depressive episodes: Secondary | ICD-10-CM

## 2018-08-31 ENCOUNTER — Ambulatory Visit (INDEPENDENT_AMBULATORY_CARE_PROVIDER_SITE_OTHER): Payer: Managed Care, Other (non HMO) | Admitting: Family Medicine

## 2018-08-31 VITALS — BP 133/74 | HR 69 | Temp 98.0°F | Ht 64.0 in | Wt 250.0 lb

## 2018-08-31 DIAGNOSIS — F3289 Other specified depressive episodes: Secondary | ICD-10-CM | POA: Diagnosis not present

## 2018-08-31 DIAGNOSIS — Z9189 Other specified personal risk factors, not elsewhere classified: Secondary | ICD-10-CM

## 2018-08-31 DIAGNOSIS — E559 Vitamin D deficiency, unspecified: Secondary | ICD-10-CM | POA: Diagnosis not present

## 2018-08-31 DIAGNOSIS — Z6841 Body Mass Index (BMI) 40.0 and over, adult: Secondary | ICD-10-CM

## 2018-08-31 MED ORDER — VITAMIN D (ERGOCALCIFEROL) 1.25 MG (50000 UNIT) PO CAPS: 50000.0000 [IU] | ORAL_CAPSULE | ORAL | 0 refills | Status: DC

## 2018-08-31 MED ORDER — BUPROPION HCL ER (SR) 150 MG PO TB12: 150.0000 mg | ORAL_TABLET | Freq: Every day | ORAL | 0 refills | Status: DC

## 2018-09-01 NOTE — Progress Notes (Signed)
Office: [phone]  /  Fax: [phone]   HPI:   Chief Complaint: OBESITY [name] is here to discuss her progress with her obesity treatment plan. She is on the  follow the Category 3 plan and is following her eating plan approximately 95 % of the time. She states she is exercising 40 minutes 7 times per week. [name] has done very well with weight loss on her Category 3 plan. Her hunger is controlled and she is doing well with meal plan.  Her weight is 250 lb (113.4 kg) today and has had a weight loss of 6 pounds over a period of 3 weeks since her last visit. She has lost 16 lbs since starting treatment with [place].  Vitamin D deficiency [name] has a diagnosis of vitamin D deficiency. She is currently taking vit D and denies nausea, vomiting or muscle weakness.   Ref. Range 05/26/2018 00:00  Vitamin D, 25-Hydroxy Latest Ref Range: 30.0 - 100.0 ng/mL 37.5   Depression with emotional eating behaviors [name] is struggling with emotional eating and using food for comfort to the extent that it is negatively impacting her health. She often snacks when she is not hungry. [name] sometimes feels she is out of control and then feels guilty that she made poor food choices. She has been working on behavior modification techniques to help reduce her emotional eating and has been somewhat successful She is currently on Wellbutrin she is sleeping well and blood pressure is stable. She shows no sign of suicidal or homicidal ideations.  Depression screen [facility] 2/9 05/26/2018 03/30/2018 03/03/2017  Decreased Interest 3 0 0  Down, Depressed, Hopeless 2 0 0  PHQ - 2 Score 5 0 0  Altered sleeping 1 - -  Tired, decreased energy 3 - -  Change in appetite 0 - -  Feeling bad or failure about yourself  0 - -  Trouble concentrating 0 - -  Moving slowly or fidgety/restless 0 - -  Suicidal thoughts 0 - -  PHQ-9 Score 9 - -  Difficult doing work/chores Somewhat difficult - -   At risk for cardiovascular  disease [name] is at a higher than average risk for cardiovascular disease due to obesity. She currently denies any chest pain.  ALLERGIES: Allergies  Allergen Reactions  . Hydrochlorothiazide     REACTION: cramps  . Phentermine Hcl     REACTION: dizzy  . Valsartan     REACTION: side effect- lightheadedness  . Verapamil     REACTION: ? lightheaded    MEDICATIONS: Current Outpatient Medications on File Prior to Visit  Medication Sig Dispense Refill  . carvedilol (COREG) 25 MG tablet TAKE 1 TABLET BY MOUTH TWICE A DAY WITH A MEAL 180 tablet 3  . fluticasone furoate-vilanterol (BREO ELLIPTA) 100-25 MCG/INH AEPB Inhale 1 puff into the lungs daily. 1 each 11  . furosemide (LASIX) 20 MG tablet TAKE 1 TABLET BY MOUTH EVERY DAY AS NEEDED FOR EDEMA 90 tablet 3  . loratadine (CLARITIN) 10 MG tablet Take 1 tablet (10 mg total) by mouth daily. 100 tablet 3  . montelukast (SINGULAIR) 10 MG tablet Take 1 tablet (10 mg total) by mouth daily. 90 tablet 3  . triamcinolone cream (KENALOG) 0.5 % Apply 1 application topically 3 (three) times daily. 120 g 3   No current facility-administered medications on file prior to visit.     PAST MEDICAL HISTORY: Past Medical History:  Diagnosis Date  . Abnormal glucose 2009  . Allergic rhinitis   .  Back pain   . COPD (chronic obstructive pulmonary disease) ([place])   . Dyspnea   . Food allergy   . GERD (gastroesophageal reflux disease)   . Headache    Migraines (rare)  . HTN (hypertension)   . Lactose intolerance   . Leg edema   . Osteoarthritis    Right Hip - SEVERE    PAST SURGICAL HISTORY: Past Surgical History:  Procedure Laterality Date  . CHOLECYSTECTOMY    . COLONOSCOPY    . hole repair in heart     2004 at [place]   . HYSTEROSCOPY W/D&C N/A 04/04/2016   Procedure: DILATATION AND CURETTAGE /HYSTEROSCOPY, POLYPECTOMY WITH MYOSURE;  Surgeon: Olga Millers, MD;  Location: [facility];  Service: Gynecology;  Laterality: N/A;  . KNEE ARTHROSCOPY      left   . TOTAL HIP ARTHROPLASTY Right 07/20/2013   Procedure: RIGHT TOTAL HIP ARTHROPLASTY ANTERIOR APPROACH;  Surgeon: Mauri Pole, MD;  Location: [facility];  Service: Orthopedics;  Laterality: Right;  . TUBAL LIGATION    . WISDOM TOOTH EXTRACTION      SOCIAL HISTORY: Social History   Tobacco Use  . Smoking status: Former Smoker    Packs/day: 0.50    Years: 40.00    Pack years: 20.00    Types: Cigarettes    Last attempt to quit: 12/09/2011    Years since quitting: 6.7  . Smokeless tobacco: Never Used  . Tobacco comment: 2009  Substance Use Topics  . Alcohol use: Yes    Alcohol/week: 7.0 standard drinks    Types: 7 Glasses of wine per week    Comment: 1 glass of wine nightly  . Drug use: No    FAMILY HISTORY: Family History  Problem Relation Age of Onset  . Parkinsonism Mother   . Hypertension Mother   . Hyperlipidemia Mother   . Hypertension Unknown     ROS: Review of Systems  Constitutional: Positive for weight loss.  Cardiovascular: Negative for chest pain.  Gastrointestinal: Negative for nausea and vomiting.  Musculoskeletal:       Negative for muscle weakness  Psychiatric/Behavioral: Positive for depression. Negative for suicidal ideas.       Negative for homicidal ideations     PHYSICAL EXAM: Blood pressure 133/74, pulse 69, temperature 98 F (36.7 C), temperature source Oral, height 5\' 4"  (1.626 m), weight 250 lb (113.4 kg), SpO2 98 %. Body mass index is 42.91 kg/m. Physical Exam  Constitutional: She is oriented to person, place, and time. She appears well-developed and well-nourished.  HENT:  Head: Normocephalic.  Cardiovascular: Normal rate.  Pulmonary/Chest: Effort normal.  Musculoskeletal: Normal range of motion.  Neurological: She is oriented to person, place, and time.  Skin: Skin is warm and dry.  Psychiatric: She has a normal mood and affect. Her behavior is normal.  Vitals reviewed.   RECENT LABS AND TESTS: BMET    Component Value  Date/Time   NA 141 05/26/2018 0000   K 4.3 05/26/2018 0000   CL 103 05/26/2018 0000   CO2 25 05/26/2018 0000   GLUCOSE 97 05/26/2018 0000   GLUCOSE 115 (H) 03/30/2018 1642   BUN 14 05/26/2018 0000   CREATININE 0.81 05/26/2018 0000   CALCIUM 9.2 05/26/2018 0000   GFRNONAA 78 05/26/2018 0000   GFRAA 89 05/26/2018 0000   Lab Results  Component Value Date   HGBA1C 6.0 (H) 05/26/2018   HGBA1C 5.9 08/28/2015   HGBA1C 6.3 10/26/2010   HGBA1C 6.3 07/25/2010   HGBA1C  6.1 (H) 01/27/2009   Lab Results  Component Value Date   INSULIN 10.1 05/26/2018   CBC    Component Value Date/Time   WBC 7.9 03/30/2018 1642   RBC 4.36 03/30/2018 1642   HGB 13.0 03/30/2018 1642   HCT 39.1 03/30/2018 1642   PLT 223.0 03/30/2018 1642   MCV 89.6 03/30/2018 1642   MCH 30.3 03/26/2016 0905   MCHC 33.4 03/30/2018 1642   RDW 14.0 03/30/2018 1642   LYMPHSABS 1.7 03/30/2018 1642   MONOABS 0.8 03/30/2018 1642   EOSABS 0.4 03/30/2018 1642   BASOSABS 0.1 03/30/2018 1642   Iron/TIBC/Ferritin/ %Sat No results found for: IRON, TIBC, FERRITIN, IRONPCTSAT Lipid Panel     Component Value Date/Time   CHOL 166 03/30/2018 1642   TRIG 149.0 03/30/2018 1642   HDL 37.90 (L) 03/30/2018 1642   CHOLHDL 4 03/30/2018 1642   VLDL 29.8 03/30/2018 1642   LDLCALC 99 03/30/2018 1642   Hepatic Function Panel     Component Value Date/Time   PROT 6.5 05/26/2018 0000   ALBUMIN 3.9 05/26/2018 0000   AST 18 05/26/2018 0000   ALT 17 05/26/2018 0000   ALKPHOS 57 05/26/2018 0000   BILITOT 0.3 05/26/2018 0000   BILIDIR 0.1 03/30/2018 1642      Component Value Date/Time   TSH 0.96 03/30/2018 1642   TSH 1.19 03/03/2017 1122   TSH 0.90 08/28/2015 1634    Ref. Range 05/26/2018 00:00  Vitamin D, 25-Hydroxy Latest Ref Range: 30.0 - 100.0 ng/mL 37.5   ASSESSMENT AND PLAN: Vitamin D deficiency - Plan: Vitamin D, Ergocalciferol, (DRISDOL) 50000 units CAPS capsule  Other depression - with emotional eating - Plan:  buPROPion (WELLBUTRIN SR) 150 MG 12 hr tablet  At risk for heart disease  Class 3 severe obesity with serious comorbidity and body mass index (BMI) of 40.0 to 44.9 in adult, unspecified obesity type ([place])  PLAN: Vitamin D Deficiency Florabel was informed that low vitamin D levels contributes to fatigue and are associated with obesity, breast, and colon cancer. She agrees to continue to take prescription Vit D @50 ,000 IU every week #4 with no refills and will follow up for routine testing of vitamin D, at least 2-3 times per year. She was informed of the risk of over-replacement of vitamin D and agrees to not increase her dose unless she discusses this with [place] first.  Depression with Emotional Eating Behaviors We discussed behavior modification techniques today to help Ardath deal with her emotional eating and depression. She has continue to take Wellbutrin SR 150 mg qd #30 with no refills and agreed to follow up as directed.  Cardiovascular risk counseling Renezmae was given extended (15 minutes) coronary artery disease prevention counseling today. She is 62 y.o. female and has risk factors for heart disease including obesity. We discussed intensive lifestyle modifications today with an emphasis on specific weight loss instructions and strategies. Pt was also informed of the importance of increasing exercise and decreasing saturated fats to help prevent heart disease.  Obesity Jalaiya is currently in the action stage of change. As such, her goal is to continue with weight loss efforts She has agreed to follow the Category 3 plan Cary has been instructed to work up to a goal of 150 minutes of combined cardio and strengthening exercise per week for weight loss and overall health benefits. We discussed the following Behavioral Modification Strategies today: work on meal planning and easy cooking plans and family/coworker sabotage.    Geniece has agreed  to follow up with our clinic in 2-3  weeks. She was informed of the importance of frequent follow up visits to maximize her success with intensive lifestyle modifications for her multiple health conditions.   OBESITY BEHAVIORAL INTERVENTION VISIT  Today's visit was # 6   Starting weight: 266 lb Starting date: 05/26/18 Today's weight : 250 lb Today's date: 08/31/18 Total lbs lost to date: 16 lb    ASK: We discussed the diagnosis of obesity with Jeanella Anton today and Alexes agreed to give [place] permission to discuss obesity behavioral modification therapy today.  ASSESS: Junia has the diagnosis of obesity and her BMI today is 42.89 Raenah is in the action stage of change   ADVISE: Talaysia was educated on the multiple health risks of obesity as well as the benefit of weight loss to improve her health. She was advised of the need for long term treatment and the importance of lifestyle modifications to improve her current health and to decrease her risk of future health problems.  AGREE: Multiple dietary modification options and treatment options were discussed and  Rudie agreed to follow the recommendations documented in the above note.  ARRANGE: Loran was educated on the importance of frequent visits to treat obesity as outlined per CMS and USPSTF guidelines and agreed to schedule her next follow up appointment today.  I, Renee Ramus, am acting as transcriptionist for Dennard Nip, MD  I have reviewed the above documentation for accuracy and completeness, and I agree with the above. -Dennard Nip, MD

## 2018-09-17 ENCOUNTER — Ambulatory Visit (INDEPENDENT_AMBULATORY_CARE_PROVIDER_SITE_OTHER): Payer: Managed Care, Other (non HMO) | Admitting: Family Medicine

## 2018-09-17 VITALS — BP 153/77 | HR 67 | Temp 98.0°F | Ht 64.0 in | Wt 249.0 lb

## 2018-09-17 DIAGNOSIS — E559 Vitamin D deficiency, unspecified: Secondary | ICD-10-CM | POA: Diagnosis not present

## 2018-09-17 DIAGNOSIS — Z9189 Other specified personal risk factors, not elsewhere classified: Secondary | ICD-10-CM

## 2018-09-17 DIAGNOSIS — R7303 Prediabetes: Secondary | ICD-10-CM | POA: Diagnosis not present

## 2018-09-17 DIAGNOSIS — E7849 Other hyperlipidemia: Secondary | ICD-10-CM | POA: Diagnosis not present

## 2018-09-17 DIAGNOSIS — Z6841 Body Mass Index (BMI) 40.0 and over, adult: Secondary | ICD-10-CM

## 2018-09-17 MED ORDER — VITAMIN D (ERGOCALCIFEROL) 1.25 MG (50000 UNIT) PO CAPS: 50000.0000 [IU] | ORAL_CAPSULE | ORAL | 0 refills | Status: DC

## 2018-09-18 LAB — LIPID PANEL WITH LDL/HDL RATIO
Cholesterol, Total: 173 mg/dL (ref 100–199)
HDL: 40 mg/dL (ref >39)
LDL Calculated: 118 mg/dL — ABNORMAL HIGH (ref 0–99)
LDL/HDL RATIO: 3 ratio (ref 0.0–3.2)
Triglycerides: 73 mg/dL (ref 0–149)
VLDL Cholesterol Cal: 15 mg/dL (ref 5–40)

## 2018-09-18 LAB — COMPREHENSIVE METABOLIC PANEL
A/G RATIO: 1.7 (ref 1.2–2.2)
ALBUMIN: 4.3 g/dL (ref 3.6–4.8)
ALT: 15 IU/L (ref 0–32)
AST: 13 IU/L (ref 0–40)
Alkaline Phosphatase: 59 IU/L (ref 39–117)
BILIRUBIN TOTAL: 0.3 mg/dL (ref 0.0–1.2)
BUN / CREAT RATIO: 12 (ref 12–28)
BUN: 15 mg/dL (ref 8–27)
CALCIUM: 10.1 mg/dL (ref 8.7–10.3)
CHLORIDE: 101 mmol/L (ref 96–106)
CO2: 25 mmol/L (ref 20–29)
Creatinine, Ser: 1.22 mg/dL — ABNORMAL HIGH (ref 0.57–1.00)
GFR, EST AFRICAN AMERICAN: 54 mL/min/{1.73_m2} — AB (ref >59)
GFR, EST NON AFRICAN AMERICAN: 47 mL/min/{1.73_m2} — AB (ref >59)
GLOBULIN, TOTAL: 2.6 g/dL (ref 1.5–4.5)
Glucose: 87 mg/dL (ref 65–99)
Potassium: 4.6 mmol/L (ref 3.5–5.2)
SODIUM: 142 mmol/L (ref 134–144)
Total Protein: 6.9 g/dL (ref 6.0–8.5)

## 2018-09-18 LAB — HEMOGLOBIN A1C
Est. average glucose Bld gHb Est-mCnc: 120 mg/dL
Hgb A1c MFr Bld: 5.8 % — ABNORMAL HIGH (ref 4.8–5.6)

## 2018-09-18 LAB — INSULIN, RANDOM: INSULIN: 14.2 u[IU]/mL (ref 2.6–24.9)

## 2018-09-18 LAB — VITAMIN D 25 HYDROXY (VIT D DEFICIENCY, FRACTURES): VIT D 25 HYDROXY: 59 ng/mL (ref 30.0–100.0)

## 2018-09-22 NOTE — Progress Notes (Signed)
Office: [phone]  /  Fax: [phone]   HPI:   Chief Complaint: OBESITY [name] is here to discuss her progress with her obesity treatment plan. She is on the Category 3 plan and is following her eating plan approximately 80 % of the time. She states she is doing squats, leg lifts, treadmill, and free weights for 40 minutes 3-4 times per week. [name] continues to do well with weight loss on the Category 3 plan. She states her hunger is controlled and she is doing better with eating all her food and not missing meals.  Her weight is 249 lb (112.9 kg) today and has had a weight loss of 1 pounds over a period of 3 weeks since her last visit. She has lost 17 lbs since starting treatment with [place].  Vitamin D Deficiency [name] has a diagnosis of vitamin D deficiency. She is stable on prescription Vit D, but level is not yet at goal. She denies nausea, vomiting or muscle weakness.  Hyperlipidemia [name] has hyperlipidemia and has been attempting to improve her cholesterol levels with intensive lifestyle modification including a low saturated fat diet, exercise and weight loss. She is due for labs and she denies any chest pain, claudication or myalgias.  Pre-Diabetes [name] has a diagnosis of pre-diabetes based on her elevated Hgb A1c and was informed this puts her at greater risk of developing diabetes. She is not taking metformin currently and she is attempting to improve with diet and exercise to decrease risk of diabetes. She denies nausea, vomiting, or hypoglycemia.  At risk for diabetes [name] is at higher than average risk for developing diabetes due to her obesity and pre-diabetes. She currently denies polyuria or polydipsia.  ALLERGIES: Allergies  Allergen Reactions  . Hydrochlorothiazide     REACTION: cramps  . Phentermine Hcl     REACTION: dizzy  . Valsartan     REACTION: side effect- lightheadedness  . Verapamil     REACTION: ? lightheaded    MEDICATIONS: Current  Outpatient Medications on File Prior to Visit  Medication Sig Dispense Refill  . buPROPion (WELLBUTRIN SR) 150 MG 12 hr tablet Take 1 tablet (150 mg total) by mouth daily. 30 tablet 0  . carvedilol (COREG) 25 MG tablet TAKE 1 TABLET BY MOUTH TWICE A DAY WITH A MEAL 180 tablet 3  . fluticasone furoate-vilanterol (BREO ELLIPTA) 100-25 MCG/INH AEPB Inhale 1 puff into the lungs daily. 1 each 11  . furosemide (LASIX) 20 MG tablet TAKE 1 TABLET BY MOUTH EVERY DAY AS NEEDED FOR EDEMA 90 tablet 3  . loratadine (CLARITIN) 10 MG tablet Take 1 tablet (10 mg total) by mouth daily. 100 tablet 3  . montelukast (SINGULAIR) 10 MG tablet Take 1 tablet (10 mg total) by mouth daily. 90 tablet 3  . triamcinolone cream (KENALOG) 0.5 % Apply 1 application topically 3 (three) times daily. 120 g 3   No current facility-administered medications on file prior to visit.     PAST MEDICAL HISTORY: Past Medical History:  Diagnosis Date  . Abnormal glucose 2009  . Allergic rhinitis   . Back pain   . COPD (chronic obstructive pulmonary disease) ([place])   . Dyspnea   . Food allergy   . GERD (gastroesophageal reflux disease)   . Headache    Migraines (rare)  . HTN (hypertension)   . Lactose intolerance   . Leg edema   . Osteoarthritis    Right Hip - SEVERE    PAST SURGICAL HISTORY: Past Surgical  History:  Procedure Laterality Date  . CHOLECYSTECTOMY    . COLONOSCOPY    . hole repair in heart     2004 at [place]   . HYSTEROSCOPY W/D&C N/A 04/04/2016   Procedure: DILATATION AND CURETTAGE /HYSTEROSCOPY, POLYPECTOMY WITH MYOSURE;  Surgeon: Olga Millers, MD;  Location: [facility];  Service: Gynecology;  Laterality: N/A;  . KNEE ARTHROSCOPY     left   . TOTAL HIP ARTHROPLASTY Right 07/20/2013   Procedure: RIGHT TOTAL HIP ARTHROPLASTY ANTERIOR APPROACH;  Surgeon: Mauri Pole, MD;  Location: [facility];  Service: Orthopedics;  Laterality: Right;  . TUBAL LIGATION    . WISDOM TOOTH EXTRACTION      SOCIAL  HISTORY: Social History   Tobacco Use  . Smoking status: Former Smoker    Packs/day: 0.50    Years: 40.00    Pack years: 20.00    Types: Cigarettes    Last attempt to quit: 12/09/2011    Years since quitting: 6.7  . Smokeless tobacco: Never Used  . Tobacco comment: 2009  Substance Use Topics  . Alcohol use: Yes    Alcohol/week: 7.0 standard drinks    Types: 7 Glasses of wine per week    Comment: 1 glass of wine nightly  . Drug use: No    FAMILY HISTORY: Family History  Problem Relation Age of Onset  . Parkinsonism Mother   . Hypertension Mother   . Hyperlipidemia Mother   . Hypertension Unknown     ROS: Review of Systems  Constitutional: Positive for weight loss.  Cardiovascular: Negative for chest pain and claudication.  Gastrointestinal: Negative for nausea and vomiting.  Genitourinary: Negative for frequency.  Musculoskeletal: Negative for myalgias.       Negative muscle weakness  Endo/Heme/Allergies: Negative for polydipsia.       Negative hypoglycemia    PHYSICAL EXAM: Blood pressure (!) 153/77, pulse 67, temperature 98 F (36.7 C), temperature source Oral, height 5\' 4"  (1.626 m), weight 249 lb (112.9 kg), SpO2 97 %. Body mass index is 42.74 kg/m. Physical Exam  Constitutional: She is oriented to person, place, and time. She appears well-developed and well-nourished.  Cardiovascular: Normal rate.  Pulmonary/Chest: Effort normal.  Musculoskeletal: Normal range of motion.  Neurological: She is oriented to person, place, and time.  Skin: Skin is warm and dry.  Psychiatric: She has a normal mood and affect. Her behavior is normal.  Vitals reviewed.   RECENT LABS AND TESTS: BMET    Component Value Date/Time   NA 142 09/17/2018 1114   K 4.6 09/17/2018 1114   CL 101 09/17/2018 1114   CO2 25 09/17/2018 1114   GLUCOSE 87 09/17/2018 1114   GLUCOSE 115 (H) 03/30/2018 1642   BUN 15 09/17/2018 1114   CREATININE 1.22 (H) 09/17/2018 1114   CALCIUM 10.1  09/17/2018 1114   GFRNONAA 47 (L) 09/17/2018 1114   GFRAA 54 (L) 09/17/2018 1114   Lab Results  Component Value Date   HGBA1C 5.8 (H) 09/17/2018   HGBA1C 6.0 (H) 05/26/2018   HGBA1C 5.9 08/28/2015   HGBA1C 6.3 10/26/2010   HGBA1C 6.3 07/25/2010   Lab Results  Component Value Date   INSULIN 14.2 09/17/2018   INSULIN 10.1 05/26/2018   CBC    Component Value Date/Time   WBC 7.9 03/30/2018 1642   RBC 4.36 03/30/2018 1642   HGB 13.0 03/30/2018 1642   HCT 39.1 03/30/2018 1642   PLT 223.0 03/30/2018 1642   MCV 89.6 03/30/2018 1642  MCH 30.3 03/26/2016 0905   MCHC 33.4 03/30/2018 1642   RDW 14.0 03/30/2018 1642   LYMPHSABS 1.7 03/30/2018 1642   MONOABS 0.8 03/30/2018 1642   EOSABS 0.4 03/30/2018 1642   BASOSABS 0.1 03/30/2018 1642   Iron/TIBC/Ferritin/ %Sat No results found for: IRON, TIBC, FERRITIN, IRONPCTSAT Lipid Panel     Component Value Date/Time   CHOL 173 09/17/2018 1114   TRIG 73 09/17/2018 1114   HDL 40 09/17/2018 1114   CHOLHDL 4 03/30/2018 1642   VLDL 29.8 03/30/2018 1642   LDLCALC 118 (H) 09/17/2018 1114   Hepatic Function Panel     Component Value Date/Time   PROT 6.9 09/17/2018 1114   ALBUMIN 4.3 09/17/2018 1114   AST 13 09/17/2018 1114   ALT 15 09/17/2018 1114   ALKPHOS 59 09/17/2018 1114   BILITOT 0.3 09/17/2018 1114   BILIDIR 0.1 03/30/2018 1642      Component Value Date/Time   TSH 0.96 03/30/2018 1642   TSH 1.19 03/03/2017 1122   TSH 0.90 08/28/2015 1634  Results for CLAUDETT, BAYLY (MRN [id number]) as of 09/22/2018 14:47  Ref. Range 09/17/2018 11:14  Vitamin D, 25-Hydroxy Latest Ref Range: 30.0 - 100.0 ng/mL 59.0    ASSESSMENT AND PLAN: Vitamin D deficiency - Plan: VITAMIN D 25 Hydroxy (Vit-D Deficiency, Fractures), Vitamin D, Ergocalciferol, (DRISDOL) 50000 units CAPS capsule  Other hyperlipidemia - Plan: Comprehensive metabolic panel, Lipid Panel With LDL/HDL Ratio  Prediabetes - Plan: Comprehensive metabolic panel, Hemoglobin A1c,  Insulin, random  At risk for diabetes mellitus  Class 3 severe obesity with serious comorbidity and body mass index (BMI) of 40.0 to 44.9 in adult, unspecified obesity type (HCC)  PLAN:  Vitamin D Deficiency Daizha was informed that low vitamin D levels contributes to fatigue and are associated with obesity, breast, and colon cancer. Rini agrees to continue taking prescription Vit D @50 ,000 IU every week #4 and we will refill for 1 month. She will follow up for routine testing of vitamin D, at least 2-3 times per year. She was informed of the risk of over-replacement of vitamin D and agrees to not increase her dose unless she discusses this with [place] first. We will check labs and [name] agrees to follow up with our clinic in 3 weeks.  Hyperlipidemia [name] was informed of the American Heart Association Guidelines emphasizing intensive lifestyle modifications as the first line treatment for hyperlipidemia. We discussed many lifestyle modifications today in depth, and Anisia will continue to work on decreasing saturated fats such as fatty red meat, butter and many fried foods. She will also increase vegetables and lean protein in her diet and continue to work on exercise and weight loss efforts. We will check labs and [name] agrees to follow up with our clinic in 3 weeks.  Pre-Diabetes [name] will continue to work on weight loss, diet, exercise, and decreasing simple carbohydrates in her diet to help decrease the risk of diabetes. We dicussed metformin including benefits and risks. She was informed that eating too many simple carbohydrates or too many calories at one sitting increases the likelihood of GI side effects. [name] declined metformin for now and a prescription was not written today. We will check labs and [name] agrees to follow up with our clinic in 3 as directed to monitor her progress.  Diabetes risk counselling [name] was given extended (15 minutes) diabetes prevention  counseling today. She is 62 y.o. female and has risk factors for diabetes including obesity and pre-diabetes. We discussed intensive lifestyle  modifications today with an emphasis on weight loss as well as increasing exercise and decreasing simple carbohydrates in her diet.  Obesity [name] is currently in the action stage of change. As such, her goal is to continue with weight loss efforts She has agreed to follow the Category 3 plan [name] has been instructed to work up to a goal of 150 minutes of combined cardio and strengthening exercise per week for weight loss and overall health benefits. We discussed the following Behavioral Modification Strategies today: increasing lean protein intake, decreasing simple carbohydrates, decrease eating out, and no skipping meals   [name] has agreed to follow up with our clinic in 3 weeks. She was informed of the importance of frequent follow up visits to maximize her success with intensive lifestyle modifications for her multiple health conditions.   OBESITY BEHAVIORAL INTERVENTION VISIT  Today's visit was # 7   Starting weight: 266 lbs Starting date: 05/26/18 Today's weight : 249 lbs  Today's date: 09/17/2018 Total lbs lost to date: 17    ASK: We discussed the diagnosis of obesity with [name] today and [name] agreed to give [place] permission to discuss obesity behavioral modification therapy today.  ASSESS: [name] has the diagnosis of obesity and her BMI today is 42.72 [name] is in the action stage of change   ADVISE: [name] was educated on the multiple health risks of obesity as well as the benefit of weight loss to improve her health. She was advised of the need for long term treatment and the importance of lifestyle modifications to improve her current health and to decrease her risk of future health problems.  AGREE: Multiple dietary modification options and treatment options were discussed and  [name] agreed to follow the  recommendations documented in the above note.  ARRANGE: [name] was educated on the importance of frequent visits to treat obesity as outlined per CMS and USPSTF guidelines and agreed to schedule her next follow up appointment today.  I, Trixie Dredge, am acting as transcriptionist for Dennard Nip, MD  I have reviewed the above documentation for accuracy and completeness, and I agree with the above. -Dennard Nip, MD

## 2018-09-24 ENCOUNTER — Encounter (INDEPENDENT_AMBULATORY_CARE_PROVIDER_SITE_OTHER): Payer: Self-pay | Admitting: Family Medicine

## 2018-09-27 ENCOUNTER — Other Ambulatory Visit: Payer: Self-pay | Admitting: Internal Medicine

## 2018-09-30 ENCOUNTER — Other Ambulatory Visit (INDEPENDENT_AMBULATORY_CARE_PROVIDER_SITE_OTHER): Payer: Self-pay | Admitting: Family Medicine

## 2018-09-30 DIAGNOSIS — F3289 Other specified depressive episodes: Secondary | ICD-10-CM

## 2018-10-07 ENCOUNTER — Ambulatory Visit (INDEPENDENT_AMBULATORY_CARE_PROVIDER_SITE_OTHER): Payer: Managed Care, Other (non HMO) | Admitting: Family Medicine

## 2018-10-07 VITALS — BP 119/77 | HR 69 | Temp 98.0°F | Ht 64.0 in | Wt 249.0 lb

## 2018-10-07 DIAGNOSIS — E559 Vitamin D deficiency, unspecified: Secondary | ICD-10-CM

## 2018-10-07 DIAGNOSIS — R7303 Prediabetes: Secondary | ICD-10-CM

## 2018-10-07 DIAGNOSIS — Z6841 Body Mass Index (BMI) 40.0 and over, adult: Secondary | ICD-10-CM

## 2018-10-07 DIAGNOSIS — Z9189 Other specified personal risk factors, not elsewhere classified: Secondary | ICD-10-CM | POA: Diagnosis not present

## 2018-10-07 MED ORDER — VITAMIN D (ERGOCALCIFEROL) 1.25 MG (50000 UNIT) PO CAPS: 50000.0000 [IU] | ORAL_CAPSULE | ORAL | 0 refills | Status: DC

## 2018-10-08 ENCOUNTER — Encounter (INDEPENDENT_AMBULATORY_CARE_PROVIDER_SITE_OTHER): Payer: Self-pay | Admitting: Family Medicine

## 2018-10-08 NOTE — Progress Notes (Signed)
Office: [phone]  /  Fax: [phone]   HPI:   Chief Complaint: OBESITY [name] is here to discuss her progress with her obesity treatment plan. She is on the Category 3 plan and is following her eating plan approximately 80 % of the time. She states she is doing floor exercises, weights, treadmill, and squats for 30 minutes 5-6 times per week. [name] has done well with maintaining weight, but has deviated from her plan more. She notes increase temptations for chocolate.  Her weight is 249 lb (112.9 kg) today and has not lost weight since her last visit. She has lost 17 lbs since starting treatment with [place].  Vitamin D Deficiency [name] has a diagnosis of vitamin D deficiency. She is currently taking prescription Vit D, level is now at goal. She denies nausea, vomiting or muscle weakness.  Pre-Diabetes [name] has a diagnosis of pre-diabetes based on her elevated Hgb A1c and was informed this puts her at greater risk of developing diabetes. She is not taking metformin currently, and improving with diet, she notes decrease in polyphagia when she follows her plan. She continues to work on diet and exercise to decrease risk of diabetes. She denies nausea or hypoglycemia.  At risk for diabetes [name] is at higher than average risk for developing diabetes due to her obesity and pre-diabetes. She currently denies polyuria or polydipsia.  ALLERGIES: Allergies  Allergen Reactions  . Hydrochlorothiazide     REACTION: cramps  . Phentermine Hcl     REACTION: dizzy  . Valsartan     REACTION: side effect- lightheadedness  . Verapamil     REACTION: ? lightheaded    MEDICATIONS: Current Outpatient Medications on File Prior to Visit  Medication Sig Dispense Refill  . buPROPion (WELLBUTRIN SR) 150 MG 12 hr tablet TAKE 1 TABLET BY MOUTH EVERY DAY 30 tablet 0  . carvedilol (COREG) 25 MG tablet TAKE 1 TABLET BY MOUTH TWICE A DAY WITH A MEAL 60 tablet 11  . fluticasone furoate-vilanterol  (BREO ELLIPTA) 100-25 MCG/INH AEPB Inhale 1 puff into the lungs daily. 1 each 11  . furosemide (LASIX) 20 MG tablet TAKE 1 TABLET BY MOUTH EVERY DAY AS NEEDED FOR EDEMA 90 tablet 3  . loratadine (CLARITIN) 10 MG tablet Take 1 tablet (10 mg total) by mouth daily. 100 tablet 3  . montelukast (SINGULAIR) 10 MG tablet Take 1 tablet (10 mg total) by mouth daily. 90 tablet 3  . triamcinolone cream (KENALOG) 0.5 % Apply 1 application topically 3 (three) times daily. 120 g 3   No current facility-administered medications on file prior to visit.     PAST MEDICAL HISTORY: Past Medical History:  Diagnosis Date  . Abnormal glucose 2009  . Allergic rhinitis   . Back pain   . COPD (chronic obstructive pulmonary disease) ([place])   . Dyspnea   . Food allergy   . GERD (gastroesophageal reflux disease)   . Headache    Migraines (rare)  . HTN (hypertension)   . Lactose intolerance   . Leg edema   . Osteoarthritis    Right Hip - SEVERE    PAST SURGICAL HISTORY: Past Surgical History:  Procedure Laterality Date  . CHOLECYSTECTOMY    . COLONOSCOPY    . hole repair in heart     2004 at [place]   . HYSTEROSCOPY W/D&C N/A 04/04/2016   Procedure: DILATATION AND CURETTAGE /HYSTEROSCOPY, POLYPECTOMY WITH MYOSURE;  Surgeon: Olga Millers, MD;  Location: [facility];  Service: Gynecology;  Laterality: N/A;  . KNEE ARTHROSCOPY     left   . TOTAL HIP ARTHROPLASTY Right 07/20/2013   Procedure: RIGHT TOTAL HIP ARTHROPLASTY ANTERIOR APPROACH;  Surgeon: Mauri Pole, MD;  Location: [facility];  Service: Orthopedics;  Laterality: Right;  . TUBAL LIGATION    . WISDOM TOOTH EXTRACTION      SOCIAL HISTORY: Social History   Tobacco Use  . Smoking status: Former Smoker    Packs/day: 0.50    Years: 40.00    Pack years: 20.00    Types: Cigarettes    Last attempt to quit: 12/09/2011    Years since quitting: 6.8  . Smokeless tobacco: Never Used  . Tobacco comment: 2009  Substance Use Topics  . Alcohol use: Yes     Alcohol/week: 7.0 standard drinks    Types: 7 Glasses of wine per week    Comment: 1 glass of wine nightly  . Drug use: No    FAMILY HISTORY: Family History  Problem Relation Age of Onset  . Parkinsonism Mother   . Hypertension Mother   . Hyperlipidemia Mother   . Hypertension Unknown     ROS: Review of Systems  Constitutional: Negative for weight loss.  Gastrointestinal: Negative for nausea and vomiting.  Genitourinary: Negative for frequency.  Musculoskeletal:       Negative muscle weakness  Endo/Heme/Allergies: Negative for polydipsia.       Positive polyphagia Negative hypoglycemia    PHYSICAL EXAM: Blood pressure 119/77, pulse 69, temperature 98 F (36.7 C), temperature source Oral, height 5\' 4"  (1.626 m), weight 249 lb (112.9 kg), SpO2 97 %. Body mass index is 42.74 kg/m. Physical Exam  Constitutional: She is oriented to person, place, and time. She appears well-developed and well-nourished.  Cardiovascular: Normal rate.  Pulmonary/Chest: Effort normal.  Musculoskeletal: Normal range of motion.  Neurological: She is oriented to person, place, and time.  Skin: Skin is warm and dry.  Psychiatric: She has a normal mood and affect. Her behavior is normal.  Vitals reviewed.   RECENT LABS AND TESTS: BMET    Component Value Date/Time   NA 142 09/17/2018 1114   K 4.6 09/17/2018 1114   CL 101 09/17/2018 1114   CO2 25 09/17/2018 1114   GLUCOSE 87 09/17/2018 1114   GLUCOSE 115 (H) 03/30/2018 1642   BUN 15 09/17/2018 1114   CREATININE 1.22 (H) 09/17/2018 1114   CALCIUM 10.1 09/17/2018 1114   GFRNONAA 47 (L) 09/17/2018 1114   GFRAA 54 (L) 09/17/2018 1114   Lab Results  Component Value Date   HGBA1C 5.8 (H) 09/17/2018   HGBA1C 6.0 (H) 05/26/2018   HGBA1C 5.9 08/28/2015   HGBA1C 6.3 10/26/2010   HGBA1C 6.3 07/25/2010   Lab Results  Component Value Date   INSULIN 14.2 09/17/2018   INSULIN 10.1 05/26/2018   CBC    Component Value Date/Time   WBC 7.9  03/30/2018 1642   RBC 4.36 03/30/2018 1642   HGB 13.0 03/30/2018 1642   HCT 39.1 03/30/2018 1642   PLT 223.0 03/30/2018 1642   MCV 89.6 03/30/2018 1642   MCH 30.3 03/26/2016 0905   MCHC 33.4 03/30/2018 1642   RDW 14.0 03/30/2018 1642   LYMPHSABS 1.7 03/30/2018 1642   MONOABS 0.8 03/30/2018 1642   EOSABS 0.4 03/30/2018 1642   BASOSABS 0.1 03/30/2018 1642   Iron/TIBC/Ferritin/ %Sat No results found for: IRON, TIBC, FERRITIN, IRONPCTSAT Lipid Panel     Component Value Date/Time   CHOL 173 09/17/2018 1114  TRIG 73 09/17/2018 1114   HDL 40 09/17/2018 1114   CHOLHDL 4 03/30/2018 1642   VLDL 29.8 03/30/2018 1642   LDLCALC 118 (H) 09/17/2018 1114   Hepatic Function Panel     Component Value Date/Time   PROT 6.9 09/17/2018 1114   ALBUMIN 4.3 09/17/2018 1114   AST 13 09/17/2018 1114   ALT 15 09/17/2018 1114   ALKPHOS 59 09/17/2018 1114   BILITOT 0.3 09/17/2018 1114   BILIDIR 0.1 03/30/2018 1642      Component Value Date/Time   TSH 0.96 03/30/2018 1642   TSH 1.19 03/03/2017 1122   TSH 0.90 08/28/2015 1634  Results for ROMONA, MURDY (MRN [id number]) as of 10/08/2018 10:08  Ref. Range 09/17/2018 11:14  Vitamin D, 25-Hydroxy Latest Ref Range: 30.0 - 100.0 ng/mL 59.0    ASSESSMENT AND PLAN: Vitamin D deficiency - Plan: Vitamin D, Ergocalciferol, (DRISDOL) 50000 units CAPS capsule  Prediabetes  At risk for diabetes mellitus  Class 3 severe obesity with serious comorbidity and body mass index (BMI) of 40.0 to 44.9 in adult, unspecified obesity type ([place])  PLAN:  Vitamin D Deficiency [name] was informed that low vitamin D levels contributes to fatigue and are associated with obesity, breast, and colon cancer. [name] agrees to continue taking prescription Vit D @50 ,000 IU every week #4 and we will refill for 1 month. She will follow up for routine testing of vitamin D, at least 2-3 times per year. She was informed of the risk of over-replacement of vitamin D and agrees to  not increase her dose unless she discusses this with [place] first. [name] agrees to follow up with our clinic in 2 to 3 weeks.  Pre-Diabetes [name] will continue diet prescription and will continue to work on weight loss, exercise, and decreasing simple carbohydrates in her diet to help decrease the risk of diabetes. We dicussed metformin including benefits and risks. She was informed that eating too many simple carbohydrates or too many calories at one sitting increases the likelihood of GI side effects. [name] declined metformin for now and a prescription was not written today. [name] agrees to follow up with our clinic in 2 to 3 weeks as directed to monitor her progress.  Diabetes risk counselling Jasmain was given extended (15 minutes) diabetes prevention counseling today. She is 62 y.o. female and has risk factors for diabetes including obesity and pre-diabetes. We discussed intensive lifestyle modifications today with an emphasis on weight loss as well as increasing exercise and decreasing simple carbohydrates in her diet.  Obesity [name] is currently in the action stage of change. As such, her goal is to continue with weight loss efforts She has agreed to follow the Category 3 plan [name] has been instructed to work up to a goal of 150 minutes of combined cardio and strengthening exercise per week for weight loss and overall health benefits. We discussed the following Behavioral Modification Strategies today: increasing lean protein intake, decreasing simple carbohydrates , work on meal planning and easy cooking plans, holiday eating strategies  and emotional eating strategies   [name] has agreed to follow up with our clinic in 2 to 3 weeks. She was informed of the importance of frequent follow up visits to maximize her success with intensive lifestyle modifications for her multiple health conditions.   OBESITY BEHAVIORAL INTERVENTION VISIT  Today's visit was # 8   Starting weight:  266 lbs Starting date: 05/26/18 Today's weight : 249 lbs  Today's date: 10/07/2018 Total lbs lost to date:  17    ASK: We discussed the diagnosis of obesity with [name] today and [name] agreed to give [place] permission to discuss obesity behavioral modification therapy today.  ASSESS: [name] has the diagnosis of obesity and her BMI today is 42.72 [name] is in the action stage of change   ADVISE: [name] was educated on the multiple health risks of obesity as well as the benefit of weight loss to improve her health. She was advised of the need for long term treatment and the importance of lifestyle modifications to improve her current health and to decrease her risk of future health problems.  AGREE: Multiple dietary modification options and treatment options were discussed and  [name] agreed to follow the recommendations documented in the above note.  ARRANGE: [name] was educated on the importance of frequent visits to treat obesity as outlined per CMS and USPSTF guidelines and agreed to schedule her next follow up appointment today.  I, Trixie Dredge, am acting as transcriptionist for Dennard Nip, MD  I have reviewed the above documentation for accuracy and completeness, and I agree with the above. -Dennard Nip, MD

## 2018-10-27 ENCOUNTER — Other Ambulatory Visit (INDEPENDENT_AMBULATORY_CARE_PROVIDER_SITE_OTHER): Payer: Self-pay | Admitting: Family Medicine

## 2018-10-27 ENCOUNTER — Other Ambulatory Visit: Payer: Self-pay | Admitting: Internal Medicine

## 2018-10-27 DIAGNOSIS — F3289 Other specified depressive episodes: Secondary | ICD-10-CM

## 2018-10-28 ENCOUNTER — Ambulatory Visit (INDEPENDENT_AMBULATORY_CARE_PROVIDER_SITE_OTHER): Payer: Managed Care, Other (non HMO) | Admitting: Family Medicine

## 2018-10-28 VITALS — BP 146/75 | HR 67 | Temp 98.1°F | Ht 64.0 in | Wt 247.0 lb

## 2018-10-28 DIAGNOSIS — Z9189 Other specified personal risk factors, not elsewhere classified: Secondary | ICD-10-CM | POA: Diagnosis not present

## 2018-10-28 DIAGNOSIS — F3289 Other specified depressive episodes: Secondary | ICD-10-CM

## 2018-10-28 DIAGNOSIS — I10 Essential (primary) hypertension: Secondary | ICD-10-CM | POA: Diagnosis not present

## 2018-10-28 DIAGNOSIS — E66813 Obesity, class 3: Secondary | ICD-10-CM

## 2018-10-28 DIAGNOSIS — Z6841 Body Mass Index (BMI) 40.0 and over, adult: Secondary | ICD-10-CM

## 2018-10-28 MED ORDER — BUPROPION HCL ER (SR) 150 MG PO TB12: 150.0000 mg | ORAL_TABLET | Freq: Every day | ORAL | 0 refills | Status: DC

## 2018-10-29 NOTE — Progress Notes (Signed)
Office: [phone]  /  Fax: [phone]   HPI:   Chief Complaint: OBESITY [name] is here to discuss her progress with her obesity treatment plan. She is on the Category 3 plan and is following her eating plan approximately 70-75 % of the time. She states she is doing leg lift, free weights, squats, and bike riding for 20-30 minutes 5 times per week. [name] continues to do well with weight loss on her Category 3 plan, but is concerned about Thanksgiving and holiday weight gain.  Her weight is 247 lb (112 kg) today and has had a weight loss of 2 pounds over a period of 3 weeks since her last visit. She has lost 19 lbs since starting treatment with [place].  Hypertension [name] is a 62 y.o. female with hypertension. [name] had car trouble on the way her to our clinic and suspects this is why her blood pressure is elevated. She denies chest pain or headaches. She is working weight loss to help control her blood pressure with the goal of decreasing her risk of heart attack and stroke. [name]'s blood pressure is not currently controlled.  At risk for cardiovascular disease [name] is at a higher than average risk for cardiovascular disease due to obesity and hypertension. She currently denies any chest pain.  Depression with emotional eating behaviors [name]'s mood is stable on Wellbutrin. Her blood pressure is elevated today, but this does not appear to be related to Wellbutrin. [name] struggles with emotional eating and using food for comfort to the extent that it is negatively impacting her health. She often snacks when she is not hungry. [name] sometimes feels she is out of control and then feels guilty that she made poor food choices. She has been working on behavior modification techniques to help reduce her emotional eating and has been somewhat successful. She shows no sign of suicidal or homicidal ideations.  Depression screen [facility] 2/9 05/26/2018 03/30/2018 03/03/2017  Decreased  Interest 3 0 0  Down, Depressed, Hopeless 2 0 0  PHQ - 2 Score 5 0 0  Altered sleeping 1 - -  Tired, decreased energy 3 - -  Change in appetite 0 - -  Feeling bad or failure about yourself  0 - -  Trouble concentrating 0 - -  Moving slowly or fidgety/restless 0 - -  Suicidal thoughts 0 - -  PHQ-9 Score 9 - -  Difficult doing work/chores Somewhat difficult - -    ALLERGIES: Allergies  Allergen Reactions  . Hydrochlorothiazide     REACTION: cramps  . Phentermine Hcl     REACTION: dizzy  . Valsartan     REACTION: side effect- lightheadedness  . Verapamil     REACTION: ? lightheaded    MEDICATIONS: Current Outpatient Medications on File Prior to Visit  Medication Sig Dispense Refill  . carvedilol (COREG) 25 MG tablet TAKE 1 TABLET BY MOUTH TWICE A DAY WITH A MEAL 60 tablet 11  . fluticasone furoate-vilanterol (BREO ELLIPTA) 100-25 MCG/INH AEPB Inhale 1 puff into the lungs daily. 1 each 11  . furosemide (LASIX) 20 MG tablet TAKE 1 TABLET BY MOUTH EVERY DAY AS NEEDED FOR EDEMA 90 tablet 3  . loratadine (CLARITIN) 10 MG tablet TAKE 1 TABLET BY MOUTH EVERY DAY 90 tablet 3  . montelukast (SINGULAIR) 10 MG tablet Take 1 tablet (10 mg total) by mouth daily. 90 tablet 3  . triamcinolone cream (KENALOG) 0.5 % Apply 1 application topically 3 (three) times daily. 120 g  3  . Vitamin D, Ergocalciferol, (DRISDOL) 50000 units CAPS capsule Take 1 capsule (50,000 Units total) by mouth every 7 (seven) days. 4 capsule 0   No current facility-administered medications on file prior to visit.     PAST MEDICAL HISTORY: Past Medical History:  Diagnosis Date  . Abnormal glucose 2009  . Allergic rhinitis   . Back pain   . COPD (chronic obstructive pulmonary disease) ([place])   . Dyspnea   . Food allergy   . GERD (gastroesophageal reflux disease)   . Headache    Migraines (rare)  . HTN (hypertension)   . Lactose intolerance   . Leg edema   . Osteoarthritis    Right Hip - SEVERE    PAST  SURGICAL HISTORY: Past Surgical History:  Procedure Laterality Date  . CHOLECYSTECTOMY    . COLONOSCOPY    . hole repair in heart     2004 at [place]   . HYSTEROSCOPY W/D&C N/A 04/04/2016   Procedure: DILATATION AND CURETTAGE /HYSTEROSCOPY, POLYPECTOMY WITH MYOSURE;  Surgeon: Olga Millers, MD;  Location: [facility];  Service: Gynecology;  Laterality: N/A;  . KNEE ARTHROSCOPY     left   . TOTAL HIP ARTHROPLASTY Right 07/20/2013   Procedure: RIGHT TOTAL HIP ARTHROPLASTY ANTERIOR APPROACH;  Surgeon: Mauri Pole, MD;  Location: [facility];  Service: Orthopedics;  Laterality: Right;  . TUBAL LIGATION    . WISDOM TOOTH EXTRACTION      SOCIAL HISTORY: Social History   Tobacco Use  . Smoking status: Former Smoker    Packs/day: 0.50    Years: 40.00    Pack years: 20.00    Types: Cigarettes    Last attempt to quit: 12/09/2011    Years since quitting: 6.8  . Smokeless tobacco: Never Used  . Tobacco comment: 2009  Substance Use Topics  . Alcohol use: Yes    Alcohol/week: 7.0 standard drinks    Types: 7 Glasses of wine per week    Comment: 1 glass of wine nightly  . Drug use: No    FAMILY HISTORY: Family History  Problem Relation Age of Onset  . Parkinsonism Mother   . Hypertension Mother   . Hyperlipidemia Mother   . Hypertension Unknown     ROS: Review of Systems  Constitutional: Positive for weight loss.  Cardiovascular: Negative for chest pain.  Neurological: Negative for headaches.  Psychiatric/Behavioral: Positive for depression. Negative for suicidal ideas.    PHYSICAL EXAM: Blood pressure (!) 146/75, pulse 67, temperature 98.1 F (36.7 C), temperature source Oral, height 5\' 4"  (1.626 m), weight 247 lb (112 kg), SpO2 97 %. Body mass index is 42.4 kg/m. Physical Exam  Constitutional: She is oriented to person, place, and time. She appears well-developed and well-nourished.  Cardiovascular: Normal rate.  Pulmonary/Chest: Effort normal.  Musculoskeletal: Normal range  of motion.  Neurological: She is oriented to person, place, and time.  Skin: Skin is warm and dry.  Psychiatric: She has a normal mood and affect. Her behavior is normal.  Vitals reviewed.   RECENT LABS AND TESTS: BMET    Component Value Date/Time   NA 142 09/17/2018 1114   K 4.6 09/17/2018 1114   CL 101 09/17/2018 1114   CO2 25 09/17/2018 1114   GLUCOSE 87 09/17/2018 1114   GLUCOSE 115 (H) 03/30/2018 1642   BUN 15 09/17/2018 1114   CREATININE 1.22 (H) 09/17/2018 1114   CALCIUM 10.1 09/17/2018 1114   GFRNONAA 47 (L) 09/17/2018 1114   GFRAA  54 (L) 09/17/2018 1114   Lab Results  Component Value Date   HGBA1C 5.8 (H) 09/17/2018   HGBA1C 6.0 (H) 05/26/2018   HGBA1C 5.9 08/28/2015   HGBA1C 6.3 10/26/2010   HGBA1C 6.3 07/25/2010   Lab Results  Component Value Date   INSULIN 14.2 09/17/2018   INSULIN 10.1 05/26/2018   CBC    Component Value Date/Time   WBC 7.9 03/30/2018 1642   RBC 4.36 03/30/2018 1642   HGB 13.0 03/30/2018 1642   HCT 39.1 03/30/2018 1642   PLT 223.0 03/30/2018 1642   MCV 89.6 03/30/2018 1642   MCH 30.3 03/26/2016 0905   MCHC 33.4 03/30/2018 1642   RDW 14.0 03/30/2018 1642   LYMPHSABS 1.7 03/30/2018 1642   MONOABS 0.8 03/30/2018 1642   EOSABS 0.4 03/30/2018 1642   BASOSABS 0.1 03/30/2018 1642   Iron/TIBC/Ferritin/ %Sat No results found for: IRON, TIBC, FERRITIN, IRONPCTSAT Lipid Panel     Component Value Date/Time   CHOL 173 09/17/2018 1114   TRIG 73 09/17/2018 1114   HDL 40 09/17/2018 1114   CHOLHDL 4 03/30/2018 1642   VLDL 29.8 03/30/2018 1642   LDLCALC 118 (H) 09/17/2018 1114   Hepatic Function Panel     Component Value Date/Time   PROT 6.9 09/17/2018 1114   ALBUMIN 4.3 09/17/2018 1114   AST 13 09/17/2018 1114   ALT 15 09/17/2018 1114   ALKPHOS 59 09/17/2018 1114   BILITOT 0.3 09/17/2018 1114   BILIDIR 0.1 03/30/2018 1642      Component Value Date/Time   TSH 0.96 03/30/2018 1642   TSH 1.19 03/03/2017 1122   TSH 0.90  08/28/2015 1634    ASSESSMENT AND PLAN: Essential hypertension  Other depression - with emotional eating - Plan: buPROPion (WELLBUTRIN SR) 150 MG 12 hr tablet  At risk for heart disease  Class 3 severe obesity with serious comorbidity and body mass index (BMI) of 40.0 to 44.9 in adult, unspecified obesity type (HCC)  PLAN:  Hypertension We discussed sodium restriction, working on healthy weight loss, and a regular exercise program as the means to achieve improved blood pressure control. Lynne agreed with this plan and agreed to follow up as directed. We will continue to monitor her blood pressure as well as her progress with the above lifestyle modifications. Chaye agrees to continue taking carvedilol, and continue with diet, and will watch for signs of hypotension as she continues her lifestyle modifications. We will recheck blood pressure at next visit. Anatalia agrees to follow up with our clinic in 2 to 3 weeks.  Cardiovascular risk counselling Annaleia was given extended (15 minutes) coronary artery disease prevention counseling today. She is 62 y.o. female and has risk factors for heart disease including obesity and hypertension. We discussed intensive lifestyle modifications today with an emphasis on specific weight loss instructions and strategies. Pt was also informed of the importance of increasing exercise and decreasing saturated fats to help prevent heart disease.  Depression with Emotional Eating Behaviors We discussed behavior modification techniques today to help Kathye deal with her emotional eating and depression. Mishaal agrees to continue taking Wellbutrin SR 150 mg qd #30 and we will refill for 1 month. Dominigue agrees to follow up with our clinic in 2 to 3 weeks.  Obesity Mio is currently in the action stage of change. As such, her goal is to continue with weight loss efforts She has agreed to follow the Category 3 plan Tambria has been instructed to work up to  a goal  of 150 minutes of combined cardio and strengthening exercise per week for weight loss and overall health benefits. We discussed the following Behavioral Modification Strategies today: increasing lean protein intake, decreasing simple carbohydrates  and work on meal planning and easy cooking plans   Julina has agreed to follow up with our clinic in 2 to 3 weeks. She was informed of the importance of frequent follow up visits to maximize her success with intensive lifestyle modifications for her multiple health conditions.   OBESITY BEHAVIORAL INTERVENTION VISIT  Today's visit was # 9   Starting weight: 266 lbs Starting date: 05/26/18 Today's weight : 247 lbs Today's date: 10/28/2018 Total lbs lost to date: 70    ASK: We discussed the diagnosis of obesity with Jeanella Anton today and Denna agreed to give [place] permission to discuss obesity behavioral modification therapy today.  ASSESS: Corvette has the diagnosis of obesity and her BMI today is 42.38 Blu is in the action stage of change   ADVISE: Demia was educated on the multiple health risks of obesity as well as the benefit of weight loss to improve her health. She was advised of the need for long term treatment and the importance of lifestyle modifications to improve her current health and to decrease her risk of future health problems.  AGREE: Multiple dietary modification options and treatment options were discussed and  Harmoni agreed to follow the recommendations documented in the above note.  ARRANGE: Asal was educated on the importance of frequent visits to treat obesity as outlined per CMS and USPSTF guidelines and agreed to schedule her next follow up appointment today.  I, Trixie Dredge, am acting as transcriptionist for Dennard Nip, MD  I have reviewed the above documentation for accuracy and completeness, and I agree with the above. -Dennard Nip, MD

## 2018-11-10 ENCOUNTER — Encounter (INDEPENDENT_AMBULATORY_CARE_PROVIDER_SITE_OTHER): Payer: Self-pay | Admitting: Family Medicine

## 2018-11-18 ENCOUNTER — Ambulatory Visit (INDEPENDENT_AMBULATORY_CARE_PROVIDER_SITE_OTHER): Payer: Managed Care, Other (non HMO) | Admitting: Family Medicine

## 2018-11-18 ENCOUNTER — Encounter (INDEPENDENT_AMBULATORY_CARE_PROVIDER_SITE_OTHER): Payer: Self-pay | Admitting: Family Medicine

## 2018-11-18 ENCOUNTER — Other Ambulatory Visit (INDEPENDENT_AMBULATORY_CARE_PROVIDER_SITE_OTHER): Payer: Self-pay | Admitting: Family Medicine

## 2018-11-18 VITALS — BP 116/70 | HR 72 | Temp 98.4°F | Ht 64.0 in | Wt 244.0 lb

## 2018-11-18 DIAGNOSIS — Z6841 Body Mass Index (BMI) 40.0 and over, adult: Secondary | ICD-10-CM

## 2018-11-18 DIAGNOSIS — Z9189 Other specified personal risk factors, not elsewhere classified: Secondary | ICD-10-CM | POA: Diagnosis not present

## 2018-11-18 DIAGNOSIS — E559 Vitamin D deficiency, unspecified: Secondary | ICD-10-CM

## 2018-11-18 MED ORDER — VITAMIN D (ERGOCALCIFEROL) 1.25 MG (50000 UNIT) PO CAPS: 50000.0000 [IU] | ORAL_CAPSULE | ORAL | 0 refills | Status: DC

## 2018-11-19 NOTE — Progress Notes (Signed)
Office: [phone]  /  Fax: [phone]   HPI:   Chief Complaint: OBESITY [name] is here to discuss her progress with her obesity treatment plan. She is on the Category 3 plan and is following her eating plan approximately 30 % of the time. She states she is doing leg lifts, squats, and free weights 30 minutes 5 to 7 times per week. Allie noted a lot of additional temptations over Thanksgiving. She did indulge in extra sweets, but avoided fried foods and tried to increase vegetables and lean protein.  Her weight is 244 lb (110.7 kg) today and has had a weight loss of 3 pounds over a period of 3 weeks since her last visit. She has lost 22 lbs since starting treatment with [place].  Vitamin D deficiency [name] has a diagnosis of vitamin D deficiency. She is currently taking vit D and is now at goal. She denies nausea, vomiting, or muscle weakness.  At risk for osteopenia and osteoporosis [name] is at higher risk of osteopenia and osteoporosis due to vitamin D deficiency.   ALLERGIES: Allergies  Allergen Reactions  . Hydrochlorothiazide     REACTION: cramps  . Phentermine Hcl     REACTION: dizzy  . Valsartan     REACTION: side effect- lightheadedness  . Verapamil     REACTION: ? lightheaded    MEDICATIONS: Current Outpatient Medications on File Prior to Visit  Medication Sig Dispense Refill  . buPROPion (WELLBUTRIN SR) 150 MG 12 hr tablet Take 1 tablet (150 mg total) by mouth daily. 30 tablet 0  . carvedilol (COREG) 25 MG tablet TAKE 1 TABLET BY MOUTH TWICE A DAY WITH A MEAL 60 tablet 11  . fluticasone furoate-vilanterol (BREO ELLIPTA) 100-25 MCG/INH AEPB Inhale 1 puff into the lungs daily. 1 each 11  . furosemide (LASIX) 20 MG tablet TAKE 1 TABLET BY MOUTH EVERY DAY AS NEEDED FOR EDEMA 90 tablet 3  . loratadine (CLARITIN) 10 MG tablet TAKE 1 TABLET BY MOUTH EVERY DAY 90 tablet 3  . montelukast (SINGULAIR) 10 MG tablet Take 1 tablet (10 mg total) by mouth daily. 90 tablet 3    . triamcinolone cream (KENALOG) 0.5 % Apply 1 application topically 3 (three) times daily. 120 g 3   No current facility-administered medications on file prior to visit.     PAST MEDICAL HISTORY: Past Medical History:  Diagnosis Date  . Abnormal glucose 2009  . Allergic rhinitis   . Back pain   . COPD (chronic obstructive pulmonary disease) ([place])   . Dyspnea   . Food allergy   . GERD (gastroesophageal reflux disease)   . Headache    Migraines (rare)  . HTN (hypertension)   . Lactose intolerance   . Leg edema   . Osteoarthritis    Right Hip - SEVERE    PAST SURGICAL HISTORY: Past Surgical History:  Procedure Laterality Date  . CHOLECYSTECTOMY    . COLONOSCOPY    . hole repair in heart     2004 at [place]   . HYSTEROSCOPY W/D&C N/A 04/04/2016   Procedure: DILATATION AND CURETTAGE /HYSTEROSCOPY, POLYPECTOMY WITH MYOSURE;  Surgeon: Olga Millers, MD;  Location: [facility];  Service: Gynecology;  Laterality: N/A;  . KNEE ARTHROSCOPY     left   . TOTAL HIP ARTHROPLASTY Right 07/20/2013   Procedure: RIGHT TOTAL HIP ARTHROPLASTY ANTERIOR APPROACH;  Surgeon: Mauri Pole, MD;  Location: [facility];  Service: Orthopedics;  Laterality: Right;  . TUBAL LIGATION    .  WISDOM TOOTH EXTRACTION      SOCIAL HISTORY: Social History   Tobacco Use  . Smoking status: Former Smoker    Packs/day: 0.50    Years: 40.00    Pack years: 20.00    Types: Cigarettes    Last attempt to quit: 12/09/2011    Years since quitting: 6.9  . Smokeless tobacco: Never Used  . Tobacco comment: 2009  Substance Use Topics  . Alcohol use: Yes    Alcohol/week: 7.0 standard drinks    Types: 7 Glasses of wine per week    Comment: 1 glass of wine nightly  . Drug use: No    FAMILY HISTORY: Family History  Problem Relation Age of Onset  . Parkinsonism Mother   . Hypertension Mother   . Hyperlipidemia Mother   . Hypertension Unknown     ROS: Review of Systems  Constitutional: Positive for weight loss.   Gastrointestinal: Negative for nausea and vomiting.  Musculoskeletal:       Negative for muscle weakness.    PHYSICAL EXAM: Blood pressure 116/70, pulse 72, temperature 98.4 F (36.9 C), temperature source Oral, height 5\' 4"  (1.626 m), weight 244 lb (110.7 kg), SpO2 99 %. Body mass index is 41.88 kg/m. Physical Exam Vitals signs reviewed.  Constitutional:      Appearance: Normal appearance. She is obese.  Cardiovascular:     Rate and Rhythm: Normal rate.  Pulmonary:     Effort: Pulmonary effort is normal.  Musculoskeletal: Normal range of motion.  Skin:    General: Skin is warm and dry.  Neurological:     Mental Status: She is alert and oriented to person, place, and time.  Psychiatric:        Mood and Affect: Mood normal.        Behavior: Behavior normal.     RECENT LABS AND TESTS: BMET    Component Value Date/Time   NA 142 09/17/2018 1114   K 4.6 09/17/2018 1114   CL 101 09/17/2018 1114   CO2 25 09/17/2018 1114   GLUCOSE 87 09/17/2018 1114   GLUCOSE 115 (H) 03/30/2018 1642   BUN 15 09/17/2018 1114   CREATININE 1.22 (H) 09/17/2018 1114   CALCIUM 10.1 09/17/2018 1114   GFRNONAA 47 (L) 09/17/2018 1114   GFRAA 54 (L) 09/17/2018 1114   Lab Results  Component Value Date   HGBA1C 5.8 (H) 09/17/2018   HGBA1C 6.0 (H) 05/26/2018   HGBA1C 5.9 08/28/2015   HGBA1C 6.3 10/26/2010   HGBA1C 6.3 07/25/2010   Lab Results  Component Value Date   INSULIN 14.2 09/17/2018   INSULIN 10.1 05/26/2018   CBC    Component Value Date/Time   WBC 7.9 03/30/2018 1642   RBC 4.36 03/30/2018 1642   HGB 13.0 03/30/2018 1642   HCT 39.1 03/30/2018 1642   PLT 223.0 03/30/2018 1642   MCV 89.6 03/30/2018 1642   MCH 30.3 03/26/2016 0905   MCHC 33.4 03/30/2018 1642   RDW 14.0 03/30/2018 1642   LYMPHSABS 1.7 03/30/2018 1642   MONOABS 0.8 03/30/2018 1642   EOSABS 0.4 03/30/2018 1642   BASOSABS 0.1 03/30/2018 1642   Iron/TIBC/Ferritin/ %Sat No results found for: IRON, TIBC,  FERRITIN, IRONPCTSAT Lipid Panel     Component Value Date/Time   CHOL 173 09/17/2018 1114   TRIG 73 09/17/2018 1114   HDL 40 09/17/2018 1114   CHOLHDL 4 03/30/2018 1642   VLDL 29.8 03/30/2018 1642   LDLCALC 118 (H) 09/17/2018 1114   Hepatic Function Panel  Component Value Date/Time   PROT 6.9 09/17/2018 1114   ALBUMIN 4.3 09/17/2018 1114   AST 13 09/17/2018 1114   ALT 15 09/17/2018 1114   ALKPHOS 59 09/17/2018 1114   BILITOT 0.3 09/17/2018 1114   BILIDIR 0.1 03/30/2018 1642      Component Value Date/Time   TSH 0.96 03/30/2018 1642   TSH 1.19 03/03/2017 1122   TSH 0.90 08/28/2015 1634   Results for SADIRA, STANDARD (MRN [id number]) as of 11/19/2018 08:48  Ref. Range 09/17/2018 11:14  Vitamin D, 25-Hydroxy Latest Ref Range: 30.0 - 100.0 ng/mL 59.0   ASSESSMENT AND PLAN: Vitamin D deficiency - Plan: Vitamin D, Ergocalciferol, (DRISDOL) 1.25 MG (50000 UT) CAPS capsule  At risk for osteoporosis  Class 3 severe obesity with serious comorbidity and body mass index (BMI) of 40.0 to 44.9 in adult, unspecified obesity type ([place])  PLAN:  Vitamin D Deficiency [name] was informed that low vitamin D levels contributes to fatigue and are associated with obesity, breast, and colon cancer. She agrees to continue to take prescription Vit D @50 ,000 IU every week #4 with no refills and will follow up for routine testing of vitamin D, at least 2-3 times per year. She was informed of the risk of over-replacement of vitamin D and agrees to not increase her dose unless she discusses this with [place] first. Damonica agrees to follow up in 3 to 4 weeks.  At risk for osteopenia and osteoporosis [name] was given extended (15 minutes) osteoporosis prevention counseling today. [name] is at risk for osteopenia and osteoporosis due to her vitamin D deficiency. She was encouraged to take her vitamin D and follow her higher calcium diet and increase strengthening exercise to help strengthen her bones and  decrease her risk of osteopenia and osteoporosis.  Obesity [name] is currently in the action stage of change. As such, her goal is to continue with weight loss efforts. She has agreed to follow the Category 3 plan. [name] has been instructed to work up to a goal of 150 minutes of combined cardio and strengthening exercise per week for weight loss and overall health benefits. We discussed the following Behavioral Modification Strategies today: increasing lean protein intake, keeping healthy foods in the home, and work on meal planning and easy cooking plans.  [name] has agreed to follow up with our clinic in 3 to 4 weeks. She was informed of the importance of frequent follow up visits to maximize her success with intensive lifestyle modifications for her multiple health conditions.   OBESITY BEHAVIORAL INTERVENTION VISIT  Today's visit was # 10   Starting weight: 266 lbs Starting date: 05/26/18 Today's weight : Weight: 244 lb (110.7 kg)  Today's date: 11/18/2018 Total lbs lost to date: 58  ASK: We discussed the diagnosis of obesity with [name] today and [name] agreed to give [place] permission to discuss obesity behavioral modification therapy today.  ASSESS: [name] has the diagnosis of obesity and her BMI today is 41.8. [name] is in the action stage of change.   ADVISE: [name] was educated on the multiple health risks of obesity as well as the benefit of weight loss to improve her health. She was advised of the need for long term treatment and the importance of lifestyle modifications to improve her current health and to decrease her risk of future health problems.  AGREE: Multiple dietary modification options and treatment options were discussed and [name] agreed to follow the recommendations documented in the above note.  ARRANGE: [name]  was educated on the importance of frequent visits to treat obesity as outlined per CMS and USPSTF guidelines and agreed to schedule  her next follow up appointment today.  I, Marcille Blanco, am acting as transcriptionist for Starlyn Skeans, MD  I have reviewed the above documentation for accuracy and completeness, and I agree with the above. -Dennard Nip, MD

## 2018-11-27 ENCOUNTER — Other Ambulatory Visit: Payer: Self-pay | Admitting: Internal Medicine

## 2018-12-15 ENCOUNTER — Telehealth (INDEPENDENT_AMBULATORY_CARE_PROVIDER_SITE_OTHER): Payer: Self-pay | Admitting: Family Medicine

## 2018-12-15 NOTE — Telephone Encounter (Signed)
Pt rs'd appt to 01/21; needs refill on Vitamin D and bupropion HCL called to [facility] on [address].

## 2018-12-16 ENCOUNTER — Ambulatory Visit (INDEPENDENT_AMBULATORY_CARE_PROVIDER_SITE_OTHER): Payer: Managed Care, Other (non HMO) | Admitting: Family Medicine

## 2018-12-17 ENCOUNTER — Other Ambulatory Visit (INDEPENDENT_AMBULATORY_CARE_PROVIDER_SITE_OTHER): Payer: Self-pay

## 2018-12-17 DIAGNOSIS — E559 Vitamin D deficiency, unspecified: Secondary | ICD-10-CM

## 2018-12-17 DIAGNOSIS — F3289 Other specified depressive episodes: Secondary | ICD-10-CM

## 2018-12-17 MED ORDER — BUPROPION HCL ER (SR) 150 MG PO TB12: 150.0000 mg | ORAL_TABLET | Freq: Every day | ORAL | 0 refills | Status: DC

## 2018-12-17 MED ORDER — VITAMIN D (ERGOCALCIFEROL) 1.25 MG (50000 UNIT) PO CAPS: 50000.0000 [IU] | ORAL_CAPSULE | ORAL | 0 refills | Status: DC

## 2018-12-17 NOTE — Telephone Encounter (Signed)
Prescriptions sent in to pharmacy. April, [place]

## 2018-12-28 ENCOUNTER — Ambulatory Visit: Payer: Managed Care, Other (non HMO) | Admitting: Internal Medicine

## 2018-12-28 ENCOUNTER — Encounter: Payer: Self-pay | Admitting: Internal Medicine

## 2018-12-28 ENCOUNTER — Other Ambulatory Visit (INDEPENDENT_AMBULATORY_CARE_PROVIDER_SITE_OTHER): Payer: Managed Care, Other (non HMO)

## 2018-12-28 VITALS — BP 126/76 | HR 63 | Temp 98.2°F | Ht 64.0 in | Wt 248.0 lb

## 2018-12-28 DIAGNOSIS — I10 Essential (primary) hypertension: Secondary | ICD-10-CM

## 2018-12-28 DIAGNOSIS — Z23 Encounter for immunization: Secondary | ICD-10-CM

## 2018-12-28 DIAGNOSIS — Z6841 Body Mass Index (BMI) 40.0 and over, adult: Secondary | ICD-10-CM

## 2018-12-28 DIAGNOSIS — F3289 Other specified depressive episodes: Secondary | ICD-10-CM | POA: Diagnosis not present

## 2018-12-28 DIAGNOSIS — R739 Hyperglycemia, unspecified: Secondary | ICD-10-CM | POA: Diagnosis not present

## 2018-12-28 DIAGNOSIS — E559 Vitamin D deficiency, unspecified: Secondary | ICD-10-CM

## 2018-12-28 LAB — LIPID PANEL
Cholesterol: 183 mg/dL (ref 0–200)
HDL: 38.8 mg/dL — AB (ref >39.00)
LDL Cholesterol: 116 mg/dL — ABNORMAL HIGH (ref 0–99)
NonHDL: 144.37
Total CHOL/HDL Ratio: 5
Triglycerides: 144 mg/dL (ref 0.0–149.0)
VLDL: 28.8 mg/dL (ref 0.0–40.0)

## 2018-12-28 LAB — CBC WITH DIFFERENTIAL/PLATELET
Basophils Absolute: 0.1 10*3/uL (ref 0.0–0.1)
Basophils Relative: 1.2 % (ref 0.0–3.0)
Eosinophils Absolute: 0.3 10*3/uL (ref 0.0–0.7)
Eosinophils Relative: 5.1 % — ABNORMAL HIGH (ref 0.0–5.0)
HCT: 39.7 % (ref 36.0–46.0)
Hemoglobin: 12.9 g/dL (ref 12.0–15.0)
Lymphocytes Relative: 30.2 % (ref 12.0–46.0)
Lymphs Abs: 1.8 10*3/uL (ref 0.7–4.0)
MCHC: 32.5 g/dL (ref 30.0–36.0)
MCV: 90.9 fl (ref 78.0–100.0)
Monocytes Absolute: 0.6 10*3/uL (ref 0.1–1.0)
Monocytes Relative: 10.4 % (ref 3.0–12.0)
Neutro Abs: 3.2 10*3/uL (ref 1.4–7.7)
Neutrophils Relative %: 53.1 % (ref 43.0–77.0)
Platelets: 233 10*3/uL (ref 150.0–400.0)
RBC: 4.37 Mil/uL (ref 3.87–5.11)
RDW: 13.8 % (ref 11.5–15.5)
WBC: 6 10*3/uL (ref 4.0–10.5)

## 2018-12-28 LAB — HEPATIC FUNCTION PANEL
ALT: 12 U/L (ref 0–35)
AST: 13 U/L (ref 0–37)
Albumin: 4.1 g/dL (ref 3.5–5.2)
Alkaline Phosphatase: 63 U/L (ref 39–117)
Bilirubin, Direct: 0 mg/dL (ref 0.0–0.3)
Total Bilirubin: 0.4 mg/dL (ref 0.2–1.2)
Total Protein: 7.5 g/dL (ref 6.0–8.3)

## 2018-12-28 LAB — URINALYSIS
Bilirubin Urine: NEGATIVE
Hgb urine dipstick: NEGATIVE
Ketones, ur: NEGATIVE
Leukocytes, UA: NEGATIVE
Nitrite: NEGATIVE
Specific Gravity, Urine: 1.02 (ref 1.000–1.030)
Total Protein, Urine: NEGATIVE
Urine Glucose: NEGATIVE
Urobilinogen, UA: 0.2 (ref 0.0–1.0)
pH: 5.5 (ref 5.0–8.0)

## 2018-12-28 LAB — HEMOGLOBIN A1C: Hgb A1c MFr Bld: 6 % (ref 4.6–6.5)

## 2018-12-28 LAB — BASIC METABOLIC PANEL
BUN: 14 mg/dL (ref 6–23)
CO2: 32 mEq/L (ref 19–32)
Calcium: 10.1 mg/dL (ref 8.4–10.5)
Chloride: 101 mEq/L (ref 96–112)
Creatinine, Ser: 1.13 mg/dL (ref 0.40–1.20)
GFR: 48.49 mL/min — AB (ref >60.00)
Glucose, Bld: 91 mg/dL (ref 70–99)
Potassium: 4.2 mEq/L (ref 3.5–5.1)
SODIUM: 140 meq/L (ref 135–145)

## 2018-12-28 LAB — TSH: TSH: 1.36 u[IU]/mL (ref 0.35–4.50)

## 2018-12-28 MED ORDER — TRIAMCINOLONE ACETONIDE 0.5 % EX CREA: 1.0000 "application " | TOPICAL_CREAM | Freq: Three times a day (TID) | CUTANEOUS | 3 refills | Status: DC

## 2018-12-28 MED ORDER — FLUTICASONE FUROATE-VILANTEROL 100-25 MCG/INH IN AEPB: 1.0000 | INHALATION_SPRAY | Freq: Every day | RESPIRATORY_TRACT | 3 refills | Status: DC

## 2018-12-28 MED ORDER — CARVEDILOL 25 MG PO TABS: 25.0000 mg | ORAL_TABLET | Freq: Two times a day (BID) | ORAL | 3 refills | Status: DC

## 2018-12-28 MED ORDER — FUROSEMIDE 20 MG PO TABS: 20.0000 mg | ORAL_TABLET | Freq: Every day | ORAL | 3 refills | Status: DC

## 2018-12-28 MED ORDER — LORATADINE 10 MG PO TABS: 10.0000 mg | ORAL_TABLET | Freq: Every day | ORAL | 3 refills | Status: DC

## 2018-12-28 MED ORDER — BUPROPION HCL ER (SR) 150 MG PO TB12: 150.0000 mg | ORAL_TABLET | Freq: Every day | ORAL | 3 refills | Status: DC

## 2018-12-28 MED ORDER — MONTELUKAST SODIUM 10 MG PO TABS: 10.0000 mg | ORAL_TABLET | Freq: Every day | ORAL | 3 refills | Status: DC

## 2018-12-28 NOTE — Patient Instructions (Signed)

## 2018-12-28 NOTE — Assessment & Plan Note (Signed)
Offered cardiac CT scan for calcium scoring Labs

## 2018-12-28 NOTE — Assessment & Plan Note (Signed)
Wt Readings from Last 3 Encounters:  12/28/18 248 lb (112.5 kg)  11/18/18 244 lb (110.7 kg)  10/28/18 247 lb (112 kg)   Discussed diet

## 2018-12-28 NOTE — Progress Notes (Signed)
Subjective:  Patient ID: [name], female    DOB: [birth year]  Age: 63 y.o. MRN: [id number]  CC: No chief complaint on file.   HPI [name] presents for depression, allergies, HTN f/u C/o stress w/mom  Outpatient Medications Prior to Visit  Medication Sig Dispense Refill  . buPROPion (WELLBUTRIN SR) 150 MG 12 hr tablet Take 1 tablet (150 mg total) by mouth daily. 30 tablet 0  . carvedilol (COREG) 25 MG tablet TAKE 1 TABLET BY MOUTH TWICE A DAY WITH A MEAL 60 tablet 11  . fluticasone furoate-vilanterol (BREO ELLIPTA) 100-25 MCG/INH AEPB Inhale 1 puff into the lungs daily. 1 each 11  . furosemide (LASIX) 20 MG tablet TAKE 1 TABLET BY MOUTH EVERY DAY AS NEEDED FOR EDEMA 90 tablet 3  . loratadine (CLARITIN) 10 MG tablet TAKE 1 TABLET BY MOUTH EVERY DAY 90 tablet 3  . montelukast (SINGULAIR) 10 MG tablet Take 1 tablet (10 mg total) by mouth daily. 90 tablet 3  . triamcinolone cream (KENALOG) 0.5 % Apply 1 application topically 3 (three) times daily. 120 g 3  . Vitamin D, Ergocalciferol, (DRISDOL) 1.25 MG (50000 UT) CAPS capsule Take 1 capsule (50,000 Units total) by mouth every 7 (seven) days. 4 capsule 0   No facility-administered medications prior to visit.     ROS: Review of Systems  Constitutional: Positive for unexpected weight change. Negative for activity change, appetite change, chills and fatigue.  HENT: Negative for congestion, mouth sores and sinus pressure.   Eyes: Negative for visual disturbance.  Respiratory: Negative for cough and chest tightness.   Gastrointestinal: Negative for abdominal pain and nausea.  Genitourinary: Negative for difficulty urinating, frequency and vaginal pain.  Musculoskeletal: Negative for back pain and gait problem.  Skin: Negative for pallor and rash.  Neurological: Negative for dizziness, tremors, weakness, numbness and headaches.  Psychiatric/Behavioral: Positive for dysphoric mood. Negative for confusion and sleep disturbance.  The patient is nervous/anxious.     Objective:  BP 126/76 (BP Location: Left Arm, Patient Position: Sitting, Cuff Size: Large)   Pulse 63   Temp 98.2 F (36.8 C) (Oral)   Ht 5\' 4"  (1.626 m)   Wt 248 lb (112.5 kg)   SpO2 97%   BMI 42.57 kg/m   BP Readings from Last 3 Encounters:  12/28/18 126/76  11/18/18 116/70  10/28/18 (!) 146/75    Wt Readings from Last 3 Encounters:  12/28/18 248 lb (112.5 kg)  11/18/18 244 lb (110.7 kg)  10/28/18 247 lb (112 kg)    Physical Exam Constitutional:      General: She is not in acute distress.    Appearance: She is well-developed.  HENT:     Head: Normocephalic.     Right Ear: External ear normal.     Left Ear: External ear normal.     Nose: Nose normal.  Eyes:     General:        Right eye: No discharge.        Left eye: No discharge.     Conjunctiva/sclera: Conjunctivae normal.     Pupils: Pupils are equal, round, and reactive to light.  Neck:     Musculoskeletal: Normal range of motion and neck supple.     Thyroid: No thyromegaly.     Vascular: No JVD.     Trachea: No tracheal deviation.  Cardiovascular:     Rate and Rhythm: Normal rate and regular rhythm.     Heart sounds: Normal heart sounds.  Pulmonary:     Effort: No respiratory distress.     Breath sounds: No stridor. No wheezing.  Abdominal:     General: Bowel sounds are normal. There is no distension.     Palpations: Abdomen is soft. There is no mass.     Tenderness: There is no abdominal tenderness. There is no guarding or rebound.  Musculoskeletal:        General: No tenderness.  Lymphadenopathy:     Cervical: No cervical adenopathy.  Skin:    Findings: No erythema or rash.  Neurological:     Cranial Nerves: No cranial nerve deficit.     Motor: No abnormal muscle tone.     Coordination: Coordination normal.     Deep Tendon Reflexes: Reflexes normal.  Psychiatric:        Behavior: Behavior normal.        Thought Content: Thought content normal.         Judgment: Judgment normal.   sad Obese  Lab Results  Component Value Date   WBC 7.9 03/30/2018   HGB 13.0 03/30/2018   HCT 39.1 03/30/2018   PLT 223.0 03/30/2018   GLUCOSE 87 09/17/2018   CHOL 173 09/17/2018   TRIG 73 09/17/2018   HDL 40 09/17/2018   LDLCALC 118 (H) 09/17/2018   ALT 15 09/17/2018   AST 13 09/17/2018   NA 142 09/17/2018   K 4.6 09/17/2018   CL 101 09/17/2018   CREATININE 1.22 (H) 09/17/2018   BUN 15 09/17/2018   CO2 25 09/17/2018   TSH 0.96 03/30/2018   INR 1.09 07/13/2013   HGBA1C 5.8 (H) 09/17/2018    No results found.  Assessment & Plan:   There are no diagnoses linked to this encounter.   No orders of the defined types were placed in this encounter.    Follow-up: No follow-ups on file.  Walker Kehr, MD

## 2018-12-28 NOTE — Addendum Note (Signed)
Addended by: Karren Cobble on: 12/28/2018 04:46 PM   Modules accepted: Orders

## 2018-12-28 NOTE — Assessment & Plan Note (Signed)
Offered cardiac CT scan for calcium scoring 

## 2018-12-29 ENCOUNTER — Ambulatory Visit (INDEPENDENT_AMBULATORY_CARE_PROVIDER_SITE_OTHER): Payer: Managed Care, Other (non HMO) | Admitting: Family Medicine

## 2018-12-29 ENCOUNTER — Encounter (INDEPENDENT_AMBULATORY_CARE_PROVIDER_SITE_OTHER): Payer: Self-pay | Admitting: Family Medicine

## 2018-12-29 VITALS — BP 135/77 | HR 83 | Temp 99.2°F | Ht 64.0 in | Wt 244.0 lb

## 2018-12-29 DIAGNOSIS — Z6841 Body Mass Index (BMI) 40.0 and over, adult: Secondary | ICD-10-CM | POA: Diagnosis not present

## 2018-12-29 DIAGNOSIS — R7303 Prediabetes: Secondary | ICD-10-CM

## 2018-12-30 NOTE — Progress Notes (Signed)
Office: [phone]  /  Fax: [phone]   HPI:   Chief Complaint: OBESITY [name] is here to discuss her progress with her obesity treatment plan. She is on the Category 3 plan and is following her eating plan approximately 30 % of the time. She states she is exercising 0 minutes 0 times per week. [name] has done well minimizing weight gain over the holidays and has maintained her weight. She has been mostly doing portion control and smarter food choices, and working on increasing lean protein and vegetables.  Her weight is 244 lb (110.7 kg) today and has not lost weight since her last visit. She has lost 22 lbs since starting treatment with [place].  Pre-Diabetes [name] has a diagnosis of pre-diabetes based on her elevated Hgb A1c and was informed this puts her at greater risk of developing diabetes. She is not taking metformin currently and she is working on diet and weight loss, and A1c has greatly improved. She denies nausea, vomiting, or hypoglycemia and notes decreased polyphagia.  ASSESSMENT AND PLAN:  Prediabetes  Class 3 severe obesity with serious comorbidity and body mass index (BMI) of 40.0 to 44.9 in adult, unspecified obesity type [name])  PLAN:  Pre-Diabetes [name] will continue to work on weight loss, diet, exercise, and decreasing simple carbohydrates in her diet to help decrease the risk of diabetes, and will continue to monitor. We dicussed metformin including benefits and risks. She was informed that eating too many simple carbohydrates or too many calories at one sitting increases the likelihood of GI side effects. [name] declined metformin for now and a prescription was not written today. [name] agrees to follow up with our clinic in 3 weeks as directed to monitor her progress.  I spent > than 50% of the 25 minute visit on counseling as documented in the note.  Obesity [name] is currently in the action stage of change. As such, her goal is to continue with  weight loss efforts She has agreed to follow the Category 3 plan [name] has been instructed to work up to a goal of 150 minutes of combined cardio and strengthening exercise per week for weight loss and overall health benefits. We discussed the following Behavioral Modification Strategies today: increasing lean protein intake, decreasing simple carbohydrates , increasing vegetables and emotional eating strategies   [name] has agreed to follow up with our clinic in 3 weeks. She was informed of the importance of frequent follow up visits to maximize her success with intensive lifestyle modifications for her multiple health conditions.  ALLERGIES: Allergies  Allergen Reactions  . Hydrochlorothiazide     REACTION: cramps  . Phentermine Hcl     REACTION: dizzy  . Valsartan     REACTION: side effect- lightheadedness  . Verapamil     REACTION: ? lightheaded    MEDICATIONS: Current Outpatient Medications on File Prior to Visit  Medication Sig Dispense Refill  . buPROPion (WELLBUTRIN SR) 150 MG 12 hr tablet Take 1 tablet (150 mg total) by mouth daily. 90 tablet 3  . carvedilol (COREG) 25 MG tablet Take 1 tablet (25 mg total) by mouth 2 (two) times daily with a meal. 180 tablet 3  . fluticasone furoate-vilanterol (BREO ELLIPTA) 100-25 MCG/INH AEPB Inhale 1 puff into the lungs daily. 3 each 3  . furosemide (LASIX) 20 MG tablet Take 1 tablet (20 mg total) by mouth daily. 90 tablet 3  . loratadine (CLARITIN) 10 MG tablet Take 1 tablet (10 mg total) by mouth daily. [place]  tablet 3  . montelukast (SINGULAIR) 10 MG tablet Take 1 tablet (10 mg total) by mouth daily. 90 tablet 3  . triamcinolone cream (KENALOG) 0.5 % Apply 1 application topically 3 (three) times daily. 120 g 3  . Vitamin D, Ergocalciferol, (DRISDOL) 1.25 MG (50000 UT) CAPS capsule Take 1 capsule (50,000 Units total) by mouth every 7 (seven) days. 4 capsule 0   No current facility-administered medications on file prior to visit.      PAST MEDICAL HISTORY: Past Medical History:  Diagnosis Date  . Abnormal glucose 2009  . Allergic rhinitis   . Back pain   . COPD (chronic obstructive pulmonary disease) ([place])   . Dyspnea   . Food allergy   . GERD (gastroesophageal reflux disease)   . Headache    Migraines (rare)  . HTN (hypertension)   . Lactose intolerance   . Leg edema   . Osteoarthritis    Right Hip - SEVERE    PAST SURGICAL HISTORY: Past Surgical History:  Procedure Laterality Date  . CHOLECYSTECTOMY    . COLONOSCOPY    . hole repair in heart     2004 at [facility]   . HYSTEROSCOPY W/D&C N/A 04/04/2016   Procedure: DILATATION AND CURETTAGE /HYSTEROSCOPY, POLYPECTOMY WITH MYOSURE;  Surgeon: Olga Millers, MD;  Location: [facility];  Service: Gynecology;  Laterality: N/A;  . KNEE ARTHROSCOPY     left   . TOTAL HIP ARTHROPLASTY Right 07/20/2013   Procedure: RIGHT TOTAL HIP ARTHROPLASTY ANTERIOR APPROACH;  Surgeon: Mauri Pole, MD;  Location: [facility];  Service: Orthopedics;  Laterality: Right;  . TUBAL LIGATION    . WISDOM TOOTH EXTRACTION      SOCIAL HISTORY: Social History   Tobacco Use  . Smoking status: Former Smoker    Packs/day: 0.50    Years: 40.00    Pack years: 20.00    Types: Cigarettes    Last attempt to quit: 12/09/2011    Years since quitting: 7.0  . Smokeless tobacco: Never Used  . Tobacco comment: 2009  Substance Use Topics  . Alcohol use: Yes    Alcohol/week: 7.0 standard drinks    Types: 7 Glasses of wine per week    Comment: 1 glass of wine nightly  . Drug use: No    FAMILY HISTORY: Family History  Problem Relation Age of Onset  . Parkinsonism Mother   . Hypertension Mother   . Hyperlipidemia Mother   . Hypertension Unknown     ROS: Review of Systems  Constitutional: Negative for weight loss.  Gastrointestinal: Negative for nausea and vomiting.  Endo/Heme/Allergies:       Negative hypoglycemia Positive polyphagia    PHYSICAL EXAM: Blood pressure 135/77, pulse  83, temperature 99.2 F (37.3 C), temperature source Oral, height 5\' 4"  (1.626 m), weight 244 lb (110.7 kg), SpO2 97 %. Body mass index is 41.88 kg/m. Physical Exam Vitals signs reviewed.  Constitutional:      Appearance: Normal appearance. She is obese.  Cardiovascular:     Rate and Rhythm: Normal rate.     Pulses: Normal pulses.  Pulmonary:     Effort: Pulmonary effort is normal.     Breath sounds: Normal breath sounds.  Musculoskeletal: Normal range of motion.  Skin:    General: Skin is warm and dry.  Neurological:     Mental Status: She is alert and oriented to person, place, and time.  Psychiatric:        Mood and Affect: Mood normal.  Behavior: Behavior normal.     RECENT LABS AND TESTS: BMET    Component Value Date/Time   NA 140 12/28/2018 1645   NA 142 09/17/2018 1114   K 4.2 12/28/2018 1645   CL 101 12/28/2018 1645   CO2 32 12/28/2018 1645   GLUCOSE 91 12/28/2018 1645   BUN 14 12/28/2018 1645   BUN 15 09/17/2018 1114   CREATININE 1.13 12/28/2018 1645   CALCIUM 10.1 12/28/2018 1645   GFRNONAA 47 (L) 09/17/2018 1114   GFRAA 54 (L) 09/17/2018 1114   Lab Results  Component Value Date   HGBA1C 6.0 12/28/2018   HGBA1C 5.8 (H) 09/17/2018   HGBA1C 6.0 (H) 05/26/2018   HGBA1C 5.9 08/28/2015   HGBA1C 6.3 10/26/2010   Lab Results  Component Value Date   INSULIN 14.2 09/17/2018   INSULIN 10.1 05/26/2018   CBC    Component Value Date/Time   WBC 6.0 12/28/2018 1645   RBC 4.37 12/28/2018 1645   HGB 12.9 12/28/2018 1645   HCT 39.7 12/28/2018 1645   PLT 233.0 12/28/2018 1645   MCV 90.9 12/28/2018 1645   MCH 30.3 03/26/2016 0905   MCHC 32.5 12/28/2018 1645   RDW 13.8 12/28/2018 1645   LYMPHSABS 1.8 12/28/2018 1645   MONOABS 0.6 12/28/2018 1645   EOSABS 0.3 12/28/2018 1645   BASOSABS 0.1 12/28/2018 1645   Iron/TIBC/Ferritin/ %Sat No results found for: IRON, TIBC, FERRITIN, IRONPCTSAT Lipid Panel     Component Value Date/Time   CHOL 183  12/28/2018 1645   CHOL 173 09/17/2018 1114   TRIG 144.0 12/28/2018 1645   HDL 38.80 (L) 12/28/2018 1645   HDL 40 09/17/2018 1114   CHOLHDL 5 12/28/2018 1645   VLDL 28.8 12/28/2018 1645   LDLCALC 116 (H) 12/28/2018 1645   LDLCALC 118 (H) 09/17/2018 1114   Hepatic Function Panel     Component Value Date/Time   PROT 7.5 12/28/2018 1645   PROT 6.9 09/17/2018 1114   ALBUMIN 4.1 12/28/2018 1645   ALBUMIN 4.3 09/17/2018 1114   AST 13 12/28/2018 1645   ALT 12 12/28/2018 1645   ALKPHOS 63 12/28/2018 1645   BILITOT 0.4 12/28/2018 1645   BILITOT 0.3 09/17/2018 1114   BILIDIR 0.0 12/28/2018 1645      Component Value Date/Time   TSH 1.36 12/28/2018 1645   TSH 0.96 03/30/2018 1642   TSH 1.19 03/03/2017 1122      OBESITY BEHAVIORAL INTERVENTION VISIT  Today's visit was # 11   Starting weight: 266 lbs Starting date: 05/26/18 Today's weight : 244 lbs  Today's date: 12/29/2018 Total lbs lost to date: 17    ASK: We discussed the diagnosis of obesity with [name] today and [name] agreed to give [place] permission to discuss obesity behavioral modification therapy today.  ASSESS: [name] has the diagnosis of obesity and her BMI today is 41.86 [name] is in the action stage of change   ADVISE: [name] was educated on the multiple health risks of obesity as well as the benefit of weight loss to improve her health. She was advised of the need for long term treatment and the importance of lifestyle modifications to improve her current health and to decrease her risk of future health problems.  AGREE: Multiple dietary modification options and treatment options were discussed and  [name] agreed to follow the recommendations documented in the above note.  ARRANGE: [name] was educated on the importance of frequent visits to treat obesity as outlined per CMS and USPSTF guidelines and  agreed to schedule her next follow up appointment today.  I, Trixie Dredge, am acting as  transcriptionist for Dennard Nip, MD  I have reviewed the above documentation for accuracy and completeness, and I agree with the above. -Dennard Nip, MD

## 2019-01-19 ENCOUNTER — Ambulatory Visit (INDEPENDENT_AMBULATORY_CARE_PROVIDER_SITE_OTHER): Payer: Managed Care, Other (non HMO) | Admitting: Family Medicine

## 2019-01-19 ENCOUNTER — Encounter (INDEPENDENT_AMBULATORY_CARE_PROVIDER_SITE_OTHER): Payer: Self-pay | Admitting: Family Medicine

## 2019-01-19 VITALS — BP 125/74 | HR 72 | Temp 97.9°F | Ht 64.0 in | Wt 242.0 lb

## 2019-01-19 DIAGNOSIS — Z9189 Other specified personal risk factors, not elsewhere classified: Secondary | ICD-10-CM | POA: Diagnosis not present

## 2019-01-19 DIAGNOSIS — Z6841 Body Mass Index (BMI) 40.0 and over, adult: Secondary | ICD-10-CM

## 2019-01-19 DIAGNOSIS — E559 Vitamin D deficiency, unspecified: Secondary | ICD-10-CM

## 2019-01-19 DIAGNOSIS — F3289 Other specified depressive episodes: Secondary | ICD-10-CM

## 2019-01-19 MED ORDER — VITAMIN D (ERGOCALCIFEROL) 1.25 MG (50000 UNIT) PO CAPS: 50000.0000 [IU] | ORAL_CAPSULE | ORAL | 0 refills | Status: DC

## 2019-01-20 NOTE — Progress Notes (Signed)
+  Office: [phone]  /  Fax: [phone]   HPI:   Chief Complaint: OBESITY [name] is here to discuss her progress with her obesity treatment plan. She is on the Category 3 plan and is following her eating plan approximately 0 % of the time. She states she is exercising 0 minutes 0 times per week. [name] has been off track in the last month with increased emotional eating. She has skipped meals and eating more ice cream, but has gotten back on track.  Her weight is 242 lb (109.8 kg) today and has had a weight loss of 2 pounds over a period of 3 weeks since her last visit. She has lost 24 lbs since starting treatment with [place].  Vitamin D Deficiency [name] has a diagnosis of vitamin D deficiency. She is stable on prescription Vit D, level is now at goal. She denies nausea, vomiting or muscle weakness.  At risk for osteopenia and osteoporosis [name] is at higher risk of osteopenia and osteoporosis due to vitamin D deficiency.   Depression with emotional eating behaviors [name] is dealing with the anniversary of her deceased son's birthday. She notes increased emotional eating and comfort eating. [name] struggles with emotional eating and using food for comfort to the extent that it is negatively impacting her health. She often snacks when she is not hungry. [name] sometimes feels she is out of control and then feels guilty that she made poor food choices. She has been working on behavior modification techniques to help reduce her emotional eating and has been somewhat successful. She shows no sign of suicidal or homicidal ideations.  Depression screen [facility] 2/9 05/26/2018 03/30/2018 03/03/2017  Decreased Interest 3 0 0  Down, Depressed, Hopeless 2 0 0  PHQ - 2 Score 5 0 0  Altered sleeping 1 - -  Tired, decreased energy 3 - -  Change in appetite 0 - -  Feeling bad or failure about yourself  0 - -  Trouble concentrating 0 - -  Moving slowly or fidgety/restless 0 - -  Suicidal thoughts  0 - -  PHQ-9 Score 9 - -  Difficult doing work/chores Somewhat difficult - -    ASSESSMENT AND PLAN:  Vitamin D deficiency - Plan: Vitamin D, Ergocalciferol, (DRISDOL) 1.25 MG (50000 UT) CAPS capsule  Other depression  At risk for osteoporosis  Class 3 severe obesity with serious comorbidity and body mass index (BMI) of 40.0 to 44.9 in adult, unspecified obesity type (HCC)  PLAN:  Vitamin D Deficiency [name] was informed that low vitamin D levels contributes to fatigue and are associated with obesity, breast, and colon cancer. [name] agrees to continue taking prescription Vit D @50 ,000 IU every week #4 and we will refill for 1 month. She will follow up for routine testing of vitamin D, at least 2-3 times per year. She was informed of the risk of over-replacement of vitamin D and agrees to not increase her dose unless she discusses this with [place] first. [name] agrees to follow up with our clinic in 3 weeks.  At risk for osteopenia and osteoporosis [name] was given extended (15 minutes) osteoporosis prevention counseling today. [name] is at risk for osteopenia and osteoporsis due to her vitamin D deficiency. She was encouraged to take her vitamin D and follow her higher calcium diet and increase strengthening exercise to help strengthen her bones and decrease her risk of osteopenia and osteoporosis.  Depression with Emotional Eating Behaviors We discussed cognitive behavioral therapy to help [name] decrease  emotional eating and depression. [name] agrees to continue taking Wellbutrin SR, and she agrees to follow up with our clinic in 3 weeks.  Obesity [name] is currently in the action stage of change. As such, her goal is to continue with weight loss efforts She has agreed to follow the Category 3 plan [name] has been instructed to work up to a goal of 150 minutes of combined cardio and strengthening exercise per week for weight loss and overall health benefits. We discussed the  following Behavioral Modification Strategies today: increasing lean protein intake, decreasing simple carbohydrates  and emotional eating strategies   [name] has agreed to follow up with our clinic in 3 weeks. She was informed of the importance of frequent follow up visits to maximize her success with intensive lifestyle modifications for her multiple health conditions.  ALLERGIES: Allergies  Allergen Reactions  . Hydrochlorothiazide     REACTION: cramps  . Phentermine Hcl     REACTION: dizzy  . Valsartan     REACTION: side effect- lightheadedness  . Verapamil     REACTION: ? lightheaded    MEDICATIONS: Current Outpatient Medications on File Prior to Visit  Medication Sig Dispense Refill  . buPROPion (WELLBUTRIN SR) 150 MG 12 hr tablet Take 1 tablet (150 mg total) by mouth daily. 90 tablet 3  . carvedilol (COREG) 25 MG tablet Take 1 tablet (25 mg total) by mouth 2 (two) times daily with a meal. 180 tablet 3  . fluticasone furoate-vilanterol (BREO ELLIPTA) 100-25 MCG/INH AEPB Inhale 1 puff into the lungs daily. 3 each 3  . furosemide (LASIX) 20 MG tablet Take 1 tablet (20 mg total) by mouth daily. 90 tablet 3  . loratadine (CLARITIN) 10 MG tablet Take 1 tablet (10 mg total) by mouth daily. 90 tablet 3  . montelukast (SINGULAIR) 10 MG tablet Take 1 tablet (10 mg total) by mouth daily. 90 tablet 3  . triamcinolone cream (KENALOG) 0.5 % Apply 1 application topically 3 (three) times daily. 120 g 3   No current facility-administered medications on file prior to visit.     PAST MEDICAL HISTORY: Past Medical History:  Diagnosis Date  . Abnormal glucose 2009  . Allergic rhinitis   . Back pain   . COPD (chronic obstructive pulmonary disease) ([place])   . Dyspnea   . Food allergy   . GERD (gastroesophageal reflux disease)   . Headache    Migraines (rare)  . HTN (hypertension)   . Lactose intolerance   . Leg edema   . Osteoarthritis    Right Hip - SEVERE    PAST SURGICAL  HISTORY: Past Surgical History:  Procedure Laterality Date  . CHOLECYSTECTOMY    . COLONOSCOPY    . hole repair in heart     2004 at [place]   . HYSTEROSCOPY W/D&C N/A 04/04/2016   Procedure: DILATATION AND CURETTAGE /HYSTEROSCOPY, POLYPECTOMY WITH MYOSURE;  Surgeon: Olga Millers, MD;  Location: [facility];  Service: Gynecology;  Laterality: N/A;  . KNEE ARTHROSCOPY     left   . TOTAL HIP ARTHROPLASTY Right 07/20/2013   Procedure: RIGHT TOTAL HIP ARTHROPLASTY ANTERIOR APPROACH;  Surgeon: Mauri Pole, MD;  Location: [facility];  Service: Orthopedics;  Laterality: Right;  . TUBAL LIGATION    . WISDOM TOOTH EXTRACTION      SOCIAL HISTORY: Social History   Tobacco Use  . Smoking status: Former Smoker    Packs/day: 0.50    Years: 40.00    Pack years: 20.00  Types: Cigarettes    Last attempt to quit: 12/09/2011    Years since quitting: 7.1  . Smokeless tobacco: Never Used  . Tobacco comment: 2009  Substance Use Topics  . Alcohol use: Yes    Alcohol/week: 7.0 standard drinks    Types: 7 Glasses of wine per week    Comment: 1 glass of wine nightly  . Drug use: No    FAMILY HISTORY: Family History  Problem Relation Age of Onset  . Parkinsonism Mother   . Hypertension Mother   . Hyperlipidemia Mother   . Hypertension Unknown     ROS: Review of Systems  Constitutional: Positive for weight loss.  Gastrointestinal: Negative for nausea and vomiting.  Musculoskeletal:       Negative muscle weakness  Psychiatric/Behavioral: Positive for depression. Negative for suicidal ideas.    PHYSICAL EXAM: Blood pressure 125/74, pulse 72, temperature 97.9 F (36.6 C), temperature source Oral, height 5\' 4"  (1.626 m), weight 242 lb (109.8 kg), SpO2 98 %. Body mass index is 41.54 kg/m. Physical Exam Vitals signs reviewed.  Constitutional:      Appearance: Normal appearance. She is obese.  Cardiovascular:     Rate and Rhythm: Normal rate.     Pulses: Normal pulses.  Pulmonary:      Effort: Pulmonary effort is normal.     Breath sounds: Normal breath sounds.  Musculoskeletal: Normal range of motion.  Skin:    General: Skin is warm and dry.  Neurological:     Mental Status: She is alert and oriented to person, place, and time.  Psychiatric:        Mood and Affect: Mood normal.        Behavior: Behavior normal.     RECENT LABS AND TESTS: BMET    Component Value Date/Time   NA 140 12/28/2018 1645   NA 142 09/17/2018 1114   K 4.2 12/28/2018 1645   CL 101 12/28/2018 1645   CO2 32 12/28/2018 1645   GLUCOSE 91 12/28/2018 1645   BUN 14 12/28/2018 1645   BUN 15 09/17/2018 1114   CREATININE 1.13 12/28/2018 1645   CALCIUM 10.1 12/28/2018 1645   GFRNONAA 47 (L) 09/17/2018 1114   GFRAA 54 (L) 09/17/2018 1114   Lab Results  Component Value Date   HGBA1C 6.0 12/28/2018   HGBA1C 5.8 (H) 09/17/2018   HGBA1C 6.0 (H) 05/26/2018   HGBA1C 5.9 08/28/2015   HGBA1C 6.3 10/26/2010   Lab Results  Component Value Date   INSULIN 14.2 09/17/2018   INSULIN 10.1 05/26/2018   CBC    Component Value Date/Time   WBC 6.0 12/28/2018 1645   RBC 4.37 12/28/2018 1645   HGB 12.9 12/28/2018 1645   HCT 39.7 12/28/2018 1645   PLT 233.0 12/28/2018 1645   MCV 90.9 12/28/2018 1645   MCH 30.3 03/26/2016 0905   MCHC 32.5 12/28/2018 1645   RDW 13.8 12/28/2018 1645   LYMPHSABS 1.8 12/28/2018 1645   MONOABS 0.6 12/28/2018 1645   EOSABS 0.3 12/28/2018 1645   BASOSABS 0.1 12/28/2018 1645   Iron/TIBC/Ferritin/ %Sat No results found for: IRON, TIBC, FERRITIN, IRONPCTSAT Lipid Panel     Component Value Date/Time   CHOL 183 12/28/2018 1645   CHOL 173 09/17/2018 1114   TRIG 144.0 12/28/2018 1645   HDL 38.80 (L) 12/28/2018 1645   HDL 40 09/17/2018 1114   CHOLHDL 5 12/28/2018 1645   VLDL 28.8 12/28/2018 1645   LDLCALC 116 (H) 12/28/2018 1645   LDLCALC 118 (H) 09/17/2018  1114   Hepatic Function Panel     Component Value Date/Time   PROT 7.5 12/28/2018 1645   PROT 6.9  09/17/2018 1114   ALBUMIN 4.1 12/28/2018 1645   ALBUMIN 4.3 09/17/2018 1114   AST 13 12/28/2018 1645   ALT 12 12/28/2018 1645   ALKPHOS 63 12/28/2018 1645   BILITOT 0.4 12/28/2018 1645   BILITOT 0.3 09/17/2018 1114   BILIDIR 0.0 12/28/2018 1645      Component Value Date/Time   TSH 1.36 12/28/2018 1645   TSH 0.96 03/30/2018 1642   TSH 1.19 03/03/2017 1122      OBESITY BEHAVIORAL INTERVENTION VISIT  Today's visit was # 12   Starting weight: 266 lbs Starting date: 05/26/18 Today's weight : 242 lbs  Today's date: 01/19/2019 Total lbs lost to date: 24    Most Recent Value 01/29/2014 - 01/28/2019  Height 5\' 4"  (1.626 m) 01/19/2019  Weight 242 lb (109.8 kg) 01/19/2019  BMI (Calculated) 41.52 01/19/2019  BLOOD PRESSURE - SYSTOLIC 569 7/94/8016  BLOOD PRESSURE - DIASTOLIC 74 5/53/7482  Waist Measurement  51 inches 05/26/2018   Body Fat % 49.3 % 01/19/2019  Total Body Water (lbs) 85.2 lbs 01/19/2019  RMR 1677 05/26/2018     ASK: We discussed the diagnosis of obesity with Jeanella Anton today and Katarzyna agreed to give [place] permission to discuss obesity behavioral modification therapy today.  ASSESS: Katelyne has the diagnosis of obesity and her BMI today is 41.52 Kade is in the action stage of change   ADVISE: Kristian was educated on the multiple health risks of obesity as well as the benefit of weight loss to improve her health. She was advised of the need for long term treatment and the importance of lifestyle modifications to improve her current health and to decrease her risk of future health problems.  AGREE: Multiple dietary modification options and treatment options were discussed and  Brynnlie agreed to follow the recommendations documented in the above note.  ARRANGE: Liba was educated on the importance of frequent visits to treat obesity as outlined per CMS and USPSTF guidelines and agreed to schedule her next follow up appointment today.  I, Trixie Dredge,  am acting as transcriptionist for Dennard Nip, MD  I have reviewed the above documentation for accuracy and completeness, and I agree with the above. -Dennard Nip, MD

## 2019-02-09 ENCOUNTER — Ambulatory Visit (INDEPENDENT_AMBULATORY_CARE_PROVIDER_SITE_OTHER): Payer: Managed Care, Other (non HMO) | Admitting: Family Medicine

## 2019-02-09 ENCOUNTER — Encounter (INDEPENDENT_AMBULATORY_CARE_PROVIDER_SITE_OTHER): Payer: Self-pay | Admitting: Family Medicine

## 2019-02-09 VITALS — BP 128/79 | HR 75 | Ht 64.0 in | Wt 246.0 lb

## 2019-02-09 DIAGNOSIS — E559 Vitamin D deficiency, unspecified: Secondary | ICD-10-CM

## 2019-02-09 DIAGNOSIS — Z6841 Body Mass Index (BMI) 40.0 and over, adult: Secondary | ICD-10-CM | POA: Diagnosis not present

## 2019-02-09 MED ORDER — VITAMIN D (ERGOCALCIFEROL) 1.25 MG (50000 UNIT) PO CAPS: 50000.0000 [IU] | ORAL_CAPSULE | ORAL | 0 refills | Status: DC

## 2019-02-10 NOTE — Progress Notes (Signed)
Office: [phone]  /  Fax: [phone]   HPI:   Chief Complaint: OBESITY [name] is here to discuss her progress with her obesity treatment plan. She is on the Category 3 plan and is following her eating plan approximately 50 % of the time. She states she is doing situps, leg lifts, and squats for 15-30 minutes 3 times per week. [name] had a birthday and had increased celebration eating. She has struggled to get back on track since. She is disappointed she hasn't done well and ready to get back on track.  Her weight is 246 lb (111.6 kg) today and has gained 4 pounds since her last visit. She has lost 20 lbs since starting treatment with [place].  Vitamin D Deficiency [name] has a diagnosis of vitamin D deficiency. She is stable on prescription Vit D, but level is not yet at goal. She denies nausea, vomiting or muscle weakness.  ASSESSMENT AND PLAN:  Vitamin D deficiency - Plan: Vitamin D, Ergocalciferol, (DRISDOL) 1.25 MG (50000 UT) CAPS capsule  Class 3 severe obesity with serious comorbidity and body mass index (BMI) of 40.0 to 44.9 in adult, unspecified obesity type ([place])  PLAN:  Vitamin D Deficiency [name] was informed that low vitamin D levels contributes to fatigue and are associated with obesity, breast, and colon cancer. [name] agrees to continue taking prescription Vit D @50 ,000 IU every week #4 and we will refill for 1 month. She will follow up for routine testing of vitamin D, at least 2-3 times per year. She was informed of the risk of over-replacement of vitamin D and agrees to not increase her dose unless she discusses this with [place] first. [name] agrees to follow up with our clinic in 3 weeks.  Obesity [name] is currently in the action stage of change. As such, her goal is to continue with weight loss efforts She has agreed to follow the Category 3 plan [name] has been instructed to work up to a goal of 150 minutes of combined cardio and strengthening exercise per  week for weight loss and overall health benefits. We discussed the following Behavioral Modification Strategies today: increasing lean protein intake, decreasing simple carbohydrates, work on meal planning and easy cooking plans, celebration eating strategies, and emotional eating strategies   [name] has agreed to follow up with our clinic in 3 weeks. She was informed of the importance of frequent follow up visits to maximize her success with intensive lifestyle modifications for her multiple health conditions.  ALLERGIES: Allergies  Allergen Reactions  . Hydrochlorothiazide     REACTION: cramps  . Phentermine Hcl     REACTION: dizzy  . Valsartan     REACTION: side effect- lightheadedness  . Verapamil     REACTION: ? lightheaded    MEDICATIONS: Current Outpatient Medications on File Prior to Visit  Medication Sig Dispense Refill  . buPROPion (WELLBUTRIN SR) 150 MG 12 hr tablet Take 1 tablet (150 mg total) by mouth daily. 90 tablet 3  . carvedilol (COREG) 25 MG tablet Take 1 tablet (25 mg total) by mouth 2 (two) times daily with a meal. 180 tablet 3  . fluticasone furoate-vilanterol (BREO ELLIPTA) 100-25 MCG/INH AEPB Inhale 1 puff into the lungs daily. 3 each 3  . furosemide (LASIX) 20 MG tablet Take 1 tablet (20 mg total) by mouth daily. 90 tablet 3  . loratadine (CLARITIN) 10 MG tablet Take 1 tablet (10 mg total) by mouth daily. 90 tablet 3  . montelukast (SINGULAIR) 10 MG tablet  Take 1 tablet (10 mg total) by mouth daily. 90 tablet 3  . triamcinolone cream (KENALOG) 0.5 % Apply 1 application topically 3 (three) times daily. 120 g 3   No current facility-administered medications on file prior to visit.     PAST MEDICAL HISTORY: Past Medical History:  Diagnosis Date  . Abnormal glucose 2009  . Allergic rhinitis   . Back pain   . COPD (chronic obstructive pulmonary disease) ([place])   . Dyspnea   . Food allergy   . GERD (gastroesophageal reflux disease)   . Headache     Migraines (rare)  . HTN (hypertension)   . Lactose intolerance   . Leg edema   . Osteoarthritis    Right Hip - SEVERE    PAST SURGICAL HISTORY: Past Surgical History:  Procedure Laterality Date  . CHOLECYSTECTOMY    . COLONOSCOPY    . hole repair in heart     2004 at [place]   . HYSTEROSCOPY W/D&C N/A 04/04/2016   Procedure: DILATATION AND CURETTAGE /HYSTEROSCOPY, POLYPECTOMY WITH MYOSURE;  Surgeon: Olga Millers, MD;  Location: [facility];  Service: Gynecology;  Laterality: N/A;  . KNEE ARTHROSCOPY     left   . TOTAL HIP ARTHROPLASTY Right 07/20/2013   Procedure: RIGHT TOTAL HIP ARTHROPLASTY ANTERIOR APPROACH;  Surgeon: Mauri Pole, MD;  Location: [facility];  Service: Orthopedics;  Laterality: Right;  . TUBAL LIGATION    . WISDOM TOOTH EXTRACTION      SOCIAL HISTORY: Social History   Tobacco Use  . Smoking status: Former Smoker    Packs/day: 0.50    Years: 40.00    Pack years: 20.00    Types: Cigarettes    Last attempt to quit: 12/09/2011    Years since quitting: 7.1  . Smokeless tobacco: Never Used  . Tobacco comment: 2009  Substance Use Topics  . Alcohol use: Yes    Alcohol/week: 7.0 standard drinks    Types: 7 Glasses of wine per week    Comment: 1 glass of wine nightly  . Drug use: No    FAMILY HISTORY: Family History  Problem Relation Age of Onset  . Parkinsonism Mother   . Hypertension Mother   . Hyperlipidemia Mother   . Hypertension Unknown     ROS: Review of Systems  Constitutional: Negative for weight loss.  Gastrointestinal: Negative for nausea and vomiting.  Musculoskeletal:       Negative muscle weakness    PHYSICAL EXAM: Blood pressure 128/79, pulse 75, height 5\' 4"  (1.626 m), weight 246 lb (111.6 kg), SpO2 97 %. Body mass index is 42.23 kg/m. Physical Exam Vitals signs reviewed.  Constitutional:      Appearance: Normal appearance. She is obese.  Cardiovascular:     Rate and Rhythm: Normal rate.     Pulses: Normal pulses.  Pulmonary:       Effort: Pulmonary effort is normal.     Breath sounds: Normal breath sounds.  Musculoskeletal: Normal range of motion.  Skin:    General: Skin is warm and dry.  Neurological:     Mental Status: She is alert and oriented to person, place, and time.  Psychiatric:        Mood and Affect: Mood normal.        Behavior: Behavior normal.     RECENT LABS AND TESTS: BMET    Component Value Date/Time   NA 140 12/28/2018 1645   NA 142 09/17/2018 1114   K 4.2 12/28/2018 1645  CL 101 12/28/2018 1645   CO2 32 12/28/2018 1645   GLUCOSE 91 12/28/2018 1645   BUN 14 12/28/2018 1645   BUN 15 09/17/2018 1114   CREATININE 1.13 12/28/2018 1645   CALCIUM 10.1 12/28/2018 1645   GFRNONAA 47 (L) 09/17/2018 1114   GFRAA 54 (L) 09/17/2018 1114   Lab Results  Component Value Date   HGBA1C 6.0 12/28/2018   HGBA1C 5.8 (H) 09/17/2018   HGBA1C 6.0 (H) 05/26/2018   HGBA1C 5.9 08/28/2015   HGBA1C 6.3 10/26/2010   Lab Results  Component Value Date   INSULIN 14.2 09/17/2018   INSULIN 10.1 05/26/2018   CBC    Component Value Date/Time   WBC 6.0 12/28/2018 1645   RBC 4.37 12/28/2018 1645   HGB 12.9 12/28/2018 1645   HCT 39.7 12/28/2018 1645   PLT 233.0 12/28/2018 1645   MCV 90.9 12/28/2018 1645   MCH 30.3 03/26/2016 0905   MCHC 32.5 12/28/2018 1645   RDW 13.8 12/28/2018 1645   LYMPHSABS 1.8 12/28/2018 1645   MONOABS 0.6 12/28/2018 1645   EOSABS 0.3 12/28/2018 1645   BASOSABS 0.1 12/28/2018 1645   Iron/TIBC/Ferritin/ %Sat No results found for: IRON, TIBC, FERRITIN, IRONPCTSAT Lipid Panel     Component Value Date/Time   CHOL 183 12/28/2018 1645   CHOL 173 09/17/2018 1114   TRIG 144.0 12/28/2018 1645   HDL 38.80 (L) 12/28/2018 1645   HDL 40 09/17/2018 1114   CHOLHDL 5 12/28/2018 1645   VLDL 28.8 12/28/2018 1645   LDLCALC 116 (H) 12/28/2018 1645   LDLCALC 118 (H) 09/17/2018 1114   Hepatic Function Panel     Component Value Date/Time   PROT 7.5 12/28/2018 1645   PROT 6.9  09/17/2018 1114   ALBUMIN 4.1 12/28/2018 1645   ALBUMIN 4.3 09/17/2018 1114   AST 13 12/28/2018 1645   ALT 12 12/28/2018 1645   ALKPHOS 63 12/28/2018 1645   BILITOT 0.4 12/28/2018 1645   BILITOT 0.3 09/17/2018 1114   BILIDIR 0.0 12/28/2018 1645      Component Value Date/Time   TSH 1.36 12/28/2018 1645   TSH 0.96 03/30/2018 1642   TSH 1.19 03/03/2017 1122      OBESITY BEHAVIORAL INTERVENTION VISIT  Today's visit was # 13   Starting weight: 266 lbs Starting date: 05/26/18 Today's weight : 246 lbs  Today's date: 02/09/2019 Total lbs lost to date: 20 At least 15 minutes were spent on discussing the following behavioral intervention visit.    02/09/2019  Height 5\' 4"  (1.626 m)  Weight 246 lb (111.6 kg)  BMI (Calculated) 42.21  BLOOD PRESSURE - SYSTOLIC 026  BLOOD PRESSURE - DIASTOLIC 79   Body Fat % 50 %  Total Body Water (lbs) 88.4 lbs    ASK: We discussed the diagnosis of obesity with Jeanella Anton today and Sukanya agreed to give [place] permission to discuss obesity behavioral modification therapy today.  ASSESS: Mariaha has the diagnosis of obesity and her BMI today is 42.21 Anabella is in the action stage of change   ADVISE: Kadi was educated on the multiple health risks of obesity as well as the benefit of weight loss to improve her health. She was advised of the need for long term treatment and the importance of lifestyle modifications to improve her current health and to decrease her risk of future health problems.  AGREE: Multiple dietary modification options and treatment options were discussed and  Deshundra agreed to follow the recommendations documented in the above note.  ARRANGE: Evelyn was educated on the importance of frequent visits to treat obesity as outlined per CMS and USPSTF guidelines and agreed to schedule her next follow up appointment today.  I, Trixie Dredge, am acting as transcriptionist for Dennard Nip, MD  I have reviewed the above  documentation for accuracy and completeness, and I agree with the above. -Dennard Nip, MD

## 2019-03-02 ENCOUNTER — Encounter (INDEPENDENT_AMBULATORY_CARE_PROVIDER_SITE_OTHER): Payer: Self-pay

## 2019-03-02 ENCOUNTER — Ambulatory Visit (INDEPENDENT_AMBULATORY_CARE_PROVIDER_SITE_OTHER): Payer: Managed Care, Other (non HMO) | Admitting: Family Medicine

## 2019-03-02 ENCOUNTER — Encounter (INDEPENDENT_AMBULATORY_CARE_PROVIDER_SITE_OTHER): Payer: Self-pay | Admitting: Family Medicine

## 2019-03-02 ENCOUNTER — Other Ambulatory Visit: Payer: Self-pay

## 2019-03-02 DIAGNOSIS — E559 Vitamin D deficiency, unspecified: Secondary | ICD-10-CM | POA: Diagnosis not present

## 2019-03-02 DIAGNOSIS — Z6841 Body Mass Index (BMI) 40.0 and over, adult: Secondary | ICD-10-CM

## 2019-03-02 MED ORDER — VITAMIN D (ERGOCALCIFEROL) 1.25 MG (50000 UNIT) PO CAPS: 50000.0000 [IU] | ORAL_CAPSULE | ORAL | 0 refills | Status: DC

## 2019-03-03 NOTE — Progress Notes (Signed)
Office: [phone]  /  Fax: [phone] TeleHealth Visit:  [name] has consented to this TeleHealth visit today via telephone call due to lack of video access. The patient is located at home, the provider is located at the [facility] office. The participants in this visit include the listed provider and patient and any and all parties involved. HPI:   Chief Complaint: OBESITY [name] is here to discuss her progress with her obesity treatment plan. She is on the Category 3 plan and is following her eating plan approximately 80 % of the time. She states she is doing floor exercise and walking on the treadmill for 20 to 30 minutes 3 times per week. [name] states that she is still working to be mindful and make good food choices. She has struggled with some grocery shopping and she notes increased stress and stress eating with trying to socially isolate with COVID-19. [name] feels she is still losing weight. We were unable to weight the patient today for this TeleHealth visit.She feels as if she has lost 1 or 2 pounds  since her last visit.   Vitamin D deficiency [name] has a diagnosis of vitamin D deficiency. [name] is stable on vit D and she is not yet at goal. [name] denies nausea, vomiting or muscle weakness.  ASSESSMENT AND PLAN:  Vitamin D deficiency - Plan: Vitamin D, Ergocalciferol, (DRISDOL) 1.25 MG (50000 UT) CAPS capsule  Class 3 severe obesity with serious comorbidity and body mass index (BMI) of 40.0 to 44.9 in adult, unspecified obesity type ([place])  PLAN:  Vitamin D Deficiency [name] was informed that low vitamin D levels contributes to fatigue and are associated with obesity, breast, and colon cancer. She agrees to continue to take prescription Vit D @50 ,000 IU every week #4 with no refills and will follow up for routine testing of vitamin D, at least 2-3 times per year. She was informed of the risk of over-replacement of vitamin D and agrees to not  increase her dose unless she discusses this with [place] first. [name] agrees to follow up as directed.  I spent > than 50% of the 25 minute visit on counseling as documented in the note.  Obesity [name] is currently in the action stage of change. As such, her goal is to continue with weight loss efforts She has agreed to follow the Category 3 plan [name] has been instructed to work up to a goal of 150 minutes of combined cardio and strengthening exercise per week for weight loss and overall health benefits. We discussed the following Behavioral Modification Strategies today: keeping healthy foods in the home, better snacking choices, increasing lean protein intake, work on meal planning and easy cooking plans, emotional eating strategies, ways to avoid boredom eating and ways to avoid night time snacking  [name] has agreed to follow up with our clinic in 2 weeks. She was informed of the importance of frequent follow up visits to maximize her success with intensive lifestyle modifications for her multiple health conditions.  ALLERGIES: Allergies  Allergen Reactions  . Hydrochlorothiazide     REACTION: cramps  . Phentermine Hcl     REACTION: dizzy  . Valsartan     REACTION: side effect- lightheadedness  . Verapamil     REACTION: ? lightheaded    MEDICATIONS: Current Outpatient Medications on File Prior to Visit  Medication Sig Dispense Refill  . buPROPion (WELLBUTRIN SR) 150 MG 12 hr tablet Take 1 tablet (150 mg total) by mouth  daily. 90 tablet 3  . carvedilol (COREG) 25 MG tablet Take 1 tablet (25 mg total) by mouth 2 (two) times daily with a meal. 180 tablet 3  . fluticasone furoate-vilanterol (BREO ELLIPTA) 100-25 MCG/INH AEPB Inhale 1 puff into the lungs daily. 3 each 3  . furosemide (LASIX) 20 MG tablet Take 1 tablet (20 mg total) by mouth daily. 90 tablet 3  . loratadine (CLARITIN) 10 MG tablet Take 1 tablet (10 mg total) by mouth daily. 90 tablet 3  . montelukast (SINGULAIR) 10  MG tablet Take 1 tablet (10 mg total) by mouth daily. 90 tablet 3  . triamcinolone cream (KENALOG) 0.5 % Apply 1 application topically 3 (three) times daily. 120 g 3   No current facility-administered medications on file prior to visit.     PAST MEDICAL HISTORY: Past Medical History:  Diagnosis Date  . Abnormal glucose 2009  . Allergic rhinitis   . Back pain   . COPD (chronic obstructive pulmonary disease) ([place])   . Dyspnea   . Food allergy   . GERD (gastroesophageal reflux disease)   . Headache    Migraines (rare)  . HTN (hypertension)   . Lactose intolerance   . Leg edema   . Osteoarthritis    Right Hip - SEVERE    PAST SURGICAL HISTORY: Past Surgical History:  Procedure Laterality Date  . CHOLECYSTECTOMY    . COLONOSCOPY    . hole repair in heart     2004 at [place]   . HYSTEROSCOPY W/D&C N/A 04/04/2016   Procedure: DILATATION AND CURETTAGE /HYSTEROSCOPY, POLYPECTOMY WITH MYOSURE;  Surgeon: Olga Millers, MD;  Location: [facility];  Service: Gynecology;  Laterality: N/A;  . KNEE ARTHROSCOPY     left   . TOTAL HIP ARTHROPLASTY Right 07/20/2013   Procedure: RIGHT TOTAL HIP ARTHROPLASTY ANTERIOR APPROACH;  Surgeon: Mauri Pole, MD;  Location: [facility];  Service: Orthopedics;  Laterality: Right;  . TUBAL LIGATION    . WISDOM TOOTH EXTRACTION      SOCIAL HISTORY: Social History   Tobacco Use  . Smoking status: Former Smoker    Packs/day: 0.50    Years: 40.00    Pack years: 20.00    Types: Cigarettes    Last attempt to quit: 12/09/2011    Years since quitting: 7.2  . Smokeless tobacco: Never Used  . Tobacco comment: 2009  Substance Use Topics  . Alcohol use: Yes    Alcohol/week: 7.0 standard drinks    Types: 7 Glasses of wine per week    Comment: 1 glass of wine nightly  . Drug use: No    FAMILY HISTORY: Family History  Problem Relation Age of Onset  . Parkinsonism Mother   . Hypertension Mother   . Hyperlipidemia Mother   . Hypertension Unknown     ROS:  Review of Systems  Constitutional: Positive for weight loss.  Gastrointestinal: Negative for nausea and vomiting.  Musculoskeletal:       Negative for muscle weakness  Psychiatric/Behavioral:       + Stress    PHYSICAL EXAM: Pt in no acute distress  RECENT LABS AND TESTS: BMET    Component Value Date/Time   NA 140 12/28/2018 1645   NA 142 09/17/2018 1114   K 4.2 12/28/2018 1645   CL 101 12/28/2018 1645   CO2 32 12/28/2018 1645   GLUCOSE 91 12/28/2018 1645   BUN 14 12/28/2018 1645   BUN 15 09/17/2018 1114   CREATININE 1.13 12/28/2018  1645   CALCIUM 10.1 12/28/2018 1645   GFRNONAA 47 (L) 09/17/2018 1114   GFRAA 54 (L) 09/17/2018 1114   Lab Results  Component Value Date   HGBA1C 6.0 12/28/2018   HGBA1C 5.8 (H) 09/17/2018   HGBA1C 6.0 (H) 05/26/2018   HGBA1C 5.9 08/28/2015   HGBA1C 6.3 10/26/2010   Lab Results  Component Value Date   INSULIN 14.2 09/17/2018   INSULIN 10.1 05/26/2018   CBC    Component Value Date/Time   WBC 6.0 12/28/2018 1645   RBC 4.37 12/28/2018 1645   HGB 12.9 12/28/2018 1645   HCT 39.7 12/28/2018 1645   PLT 233.0 12/28/2018 1645   MCV 90.9 12/28/2018 1645   MCH 30.3 03/26/2016 0905   MCHC 32.5 12/28/2018 1645   RDW 13.8 12/28/2018 1645   LYMPHSABS 1.8 12/28/2018 1645   MONOABS 0.6 12/28/2018 1645   EOSABS 0.3 12/28/2018 1645   BASOSABS 0.1 12/28/2018 1645   Iron/TIBC/Ferritin/ %Sat No results found for: IRON, TIBC, FERRITIN, IRONPCTSAT Lipid Panel     Component Value Date/Time   CHOL 183 12/28/2018 1645   CHOL 173 09/17/2018 1114   TRIG 144.0 12/28/2018 1645   HDL 38.80 (L) 12/28/2018 1645   HDL 40 09/17/2018 1114   CHOLHDL 5 12/28/2018 1645   VLDL 28.8 12/28/2018 1645   LDLCALC 116 (H) 12/28/2018 1645   LDLCALC 118 (H) 09/17/2018 1114   Hepatic Function Panel     Component Value Date/Time   PROT 7.5 12/28/2018 1645   PROT 6.9 09/17/2018 1114   ALBUMIN 4.1 12/28/2018 1645   ALBUMIN 4.3 09/17/2018 1114   AST 13  12/28/2018 1645   ALT 12 12/28/2018 1645   ALKPHOS 63 12/28/2018 1645   BILITOT 0.4 12/28/2018 1645   BILITOT 0.3 09/17/2018 1114   BILIDIR 0.0 12/28/2018 1645      Component Value Date/Time   TSH 1.36 12/28/2018 1645   TSH 0.96 03/30/2018 1642   TSH 1.19 03/03/2017 1122     Ref. Range 09/17/2018 11:14  Vitamin D, 25-Hydroxy Latest Ref Range: 30.0 - 100.0 ng/mL 59.0    I, Doreene Nest, am acting as Location manager for Dennard Nip, MD I have reviewed the above documentation for accuracy and completeness, and I agree with the above. -Dennard Nip, MD

## 2019-03-22 ENCOUNTER — Encounter (INDEPENDENT_AMBULATORY_CARE_PROVIDER_SITE_OTHER): Payer: Self-pay | Admitting: Family Medicine

## 2019-03-22 ENCOUNTER — Other Ambulatory Visit: Payer: Self-pay

## 2019-03-22 ENCOUNTER — Ambulatory Visit (INDEPENDENT_AMBULATORY_CARE_PROVIDER_SITE_OTHER): Payer: Managed Care, Other (non HMO) | Admitting: Family Medicine

## 2019-03-22 DIAGNOSIS — E559 Vitamin D deficiency, unspecified: Secondary | ICD-10-CM | POA: Diagnosis not present

## 2019-03-22 DIAGNOSIS — Z6841 Body Mass Index (BMI) 40.0 and over, adult: Secondary | ICD-10-CM

## 2019-03-22 DIAGNOSIS — E66813 Obesity, class 3: Secondary | ICD-10-CM

## 2019-03-22 MED ORDER — VITAMIN D (ERGOCALCIFEROL) 1.25 MG (50000 UNIT) PO CAPS: 50000.0000 [IU] | ORAL_CAPSULE | ORAL | 0 refills | Status: DC

## 2019-03-22 NOTE — Progress Notes (Signed)
Office: [phone]  /  Fax: [phone] TeleHealth Visit:  [name] has verbally consented to this TeleHealth visit today. The patient is located at home, the provider is located at the [facility] office. The participants in this visit include the listed provider and patient. [name] was unable to use Face Time today and the Telehealth visit was conducted via telephone.  HPI:   Chief Complaint: OBESITY [name] is here to discuss her progress with her obesity treatment plan. She is on the Category 3 plan and is following her eating plan approximately 75 % of the time. She states she is doing leg lifts, walking in place, and free weights 20 to 30 minutes 5 to 6 times per week. [name] is doing well on the Category 3 plan and feels that she has lost another 3 to 4 pounds. She is continuing to exercise and working on meal planning.  We were unable to weigh the patient today for this TeleHealth visit. She feels as if she has lost weight since her last visit. She has lost 20 lbs since starting treatment with [place].  Vitamin D Deficiency [name] has a diagnosis of vitamin D deficiency. She is currently on vit D and her last level was at goal. [name] denies nausea, vomiting, or muscle weakness.  ASSESSMENT AND PLAN:  Vitamin D deficiency - Plan: Vitamin D, Ergocalciferol, (DRISDOL) 1.25 MG (50000 UT) CAPS capsule  Class 3 severe obesity with serious comorbidity and body mass index (BMI) of 40.0 to 44.9 in adult, unspecified obesity type ([place])  PLAN:  Vitamin D Deficiency [name] was informed that low vitamin D levels contribute to fatigue and are associated with obesity, breast, and colon cancer. [name] agrees to continue to take prescription Vit D @50 ,000 IU every week #4 with no refills and will follow up for routine testing of vitamin D, at least 2-3 times per year. She was informed of the risk of over-replacement of vitamin D and agrees to not increase her dose unless  she discusses this with [place] first. [name] agrees to follow up in 2 to 3 weeks as directed.  Obesity [name] is currently in the action stage of change. As such, her goal is to continue with weight loss efforts. She has agreed to follow the Category 3 plan. [name] has been instructed to work up to a goal of 150 minutes of combined cardio and strengthening exercise per week for weight loss and overall health benefits. We discussed the following Behavioral Modification Strategies today: work on meal planning and easy cooking plans, keeping healthy foods in the home, and ways to avoid boredom eating.  [name] has agreed to follow up with our clinic in 2 to 3 weeks. She was informed of the importance of frequent follow up visits to maximize her success with intensive lifestyle modifications for her multiple health conditions.  ALLERGIES: Allergies  Allergen Reactions  . Hydrochlorothiazide     REACTION: cramps  . Phentermine Hcl     REACTION: dizzy  . Valsartan     REACTION: side effect- lightheadedness  . Verapamil     REACTION: ? lightheaded    MEDICATIONS: Current Outpatient Medications on File Prior to Visit  Medication Sig Dispense Refill  . buPROPion (WELLBUTRIN SR) 150 MG 12 hr tablet Take 1 tablet (150 mg total) by mouth daily. 90 tablet 3  . carvedilol (COREG) 25 MG tablet Take 1 tablet (25 mg total) by mouth 2 (two) times daily with a meal. 180 tablet 3  .  fluticasone furoate-vilanterol (BREO ELLIPTA) 100-25 MCG/INH AEPB Inhale 1 puff into the lungs daily. 3 each 3  . furosemide (LASIX) 20 MG tablet Take 1 tablet (20 mg total) by mouth daily. 90 tablet 3  . loratadine (CLARITIN) 10 MG tablet Take 1 tablet (10 mg total) by mouth daily. 90 tablet 3  . montelukast (SINGULAIR) 10 MG tablet Take 1 tablet (10 mg total) by mouth daily. 90 tablet 3  . triamcinolone cream (KENALOG) 0.5 % Apply 1 application topically 3 (three) times daily. 120 g 3   No current facility-administered  medications on file prior to visit.     PAST MEDICAL HISTORY: Past Medical History:  Diagnosis Date  . Abnormal glucose 2009  . Allergic rhinitis   . Back pain   . COPD (chronic obstructive pulmonary disease) ([place])   . Dyspnea   . Food allergy   . GERD (gastroesophageal reflux disease)   . Headache    Migraines (rare)  . HTN (hypertension)   . Lactose intolerance   . Leg edema   . Osteoarthritis    Right Hip - SEVERE    PAST SURGICAL HISTORY: Past Surgical History:  Procedure Laterality Date  . CHOLECYSTECTOMY    . COLONOSCOPY    . hole repair in heart     2004 at [place]   . HYSTEROSCOPY W/D&C N/A 04/04/2016   Procedure: DILATATION AND CURETTAGE /HYSTEROSCOPY, POLYPECTOMY WITH MYOSURE;  Surgeon: Olga Millers, MD;  Location: [facility];  Service: Gynecology;  Laterality: N/A;  . KNEE ARTHROSCOPY     left   . TOTAL HIP ARTHROPLASTY Right 07/20/2013   Procedure: RIGHT TOTAL HIP ARTHROPLASTY ANTERIOR APPROACH;  Surgeon: Mauri Pole, MD;  Location: [facility];  Service: Orthopedics;  Laterality: Right;  . TUBAL LIGATION    . WISDOM TOOTH EXTRACTION      SOCIAL HISTORY: Social History   Tobacco Use  . Smoking status: Former Smoker    Packs/day: 0.50    Years: 40.00    Pack years: 20.00    Types: Cigarettes    Last attempt to quit: 12/09/2011    Years since quitting: 7.2  . Smokeless tobacco: Never Used  . Tobacco comment: 2009  Substance Use Topics  . Alcohol use: Yes    Alcohol/week: 7.0 standard drinks    Types: 7 Glasses of wine per week    Comment: 1 glass of wine nightly  . Drug use: No    FAMILY HISTORY: Family History  Problem Relation Age of Onset  . Parkinsonism Mother   . Hypertension Mother   . Hyperlipidemia Mother   . Hypertension Unknown     ROS: Review of Systems  Gastrointestinal: Negative for nausea and vomiting.  Musculoskeletal:       Negative for muscle weakness.    PHYSICAL EXAM: Pt in no acute distress  RECENT LABS AND TESTS:  BMET    Component Value Date/Time   NA 140 12/28/2018 1645   NA 142 09/17/2018 1114   K 4.2 12/28/2018 1645   CL 101 12/28/2018 1645   CO2 32 12/28/2018 1645   GLUCOSE 91 12/28/2018 1645   BUN 14 12/28/2018 1645   BUN 15 09/17/2018 1114   CREATININE 1.13 12/28/2018 1645   CALCIUM 10.1 12/28/2018 1645   GFRNONAA 47 (L) 09/17/2018 1114   GFRAA 54 (L) 09/17/2018 1114   Lab Results  Component Value Date   HGBA1C 6.0 12/28/2018   HGBA1C 5.8 (H) 09/17/2018   HGBA1C 6.0 (H) 05/26/2018  HGBA1C 5.9 08/28/2015   HGBA1C 6.3 10/26/2010   Lab Results  Component Value Date   INSULIN 14.2 09/17/2018   INSULIN 10.1 05/26/2018   CBC    Component Value Date/Time   WBC 6.0 12/28/2018 1645   RBC 4.37 12/28/2018 1645   HGB 12.9 12/28/2018 1645   HCT 39.7 12/28/2018 1645   PLT 233.0 12/28/2018 1645   MCV 90.9 12/28/2018 1645   MCH 30.3 03/26/2016 0905   MCHC 32.5 12/28/2018 1645   RDW 13.8 12/28/2018 1645   LYMPHSABS 1.8 12/28/2018 1645   MONOABS 0.6 12/28/2018 1645   EOSABS 0.3 12/28/2018 1645   BASOSABS 0.1 12/28/2018 1645   Iron/TIBC/Ferritin/ %Sat No results found for: IRON, TIBC, FERRITIN, IRONPCTSAT Lipid Panel     Component Value Date/Time   CHOL 183 12/28/2018 1645   CHOL 173 09/17/2018 1114   TRIG 144.0 12/28/2018 1645   HDL 38.80 (L) 12/28/2018 1645   HDL 40 09/17/2018 1114   CHOLHDL 5 12/28/2018 1645   VLDL 28.8 12/28/2018 1645   LDLCALC 116 (H) 12/28/2018 1645   LDLCALC 118 (H) 09/17/2018 1114   Hepatic Function Panel     Component Value Date/Time   PROT 7.5 12/28/2018 1645   PROT 6.9 09/17/2018 1114   ALBUMIN 4.1 12/28/2018 1645   ALBUMIN 4.3 09/17/2018 1114   AST 13 12/28/2018 1645   ALT 12 12/28/2018 1645   ALKPHOS 63 12/28/2018 1645   BILITOT 0.4 12/28/2018 1645   BILITOT 0.3 09/17/2018 1114   BILIDIR 0.0 12/28/2018 1645      Component Value Date/Time   TSH 1.36 12/28/2018 1645   TSH 0.96 03/30/2018 1642   TSH 1.19 03/03/2017 1122     Results for ZAHNIYA, ZELLARS (MRN [id number]) as of 03/22/2019 17:44  Ref. Range 09/17/2018 11:14  Vitamin D, 25-Hydroxy Latest Ref Range: 30.0 - 100.0 ng/mL 59.0    I, Marcille Blanco, CMA, am acting as transcriptionist for Starlyn Skeans, MD I have reviewed the above documentation for accuracy and completeness, and I agree with the above. -Dennard Nip, MD

## 2019-04-13 ENCOUNTER — Other Ambulatory Visit (INDEPENDENT_AMBULATORY_CARE_PROVIDER_SITE_OTHER): Payer: Self-pay | Admitting: Family Medicine

## 2019-04-13 ENCOUNTER — Encounter (INDEPENDENT_AMBULATORY_CARE_PROVIDER_SITE_OTHER): Payer: Self-pay | Admitting: Family Medicine

## 2019-04-13 ENCOUNTER — Ambulatory Visit (INDEPENDENT_AMBULATORY_CARE_PROVIDER_SITE_OTHER): Payer: Managed Care, Other (non HMO) | Admitting: Family Medicine

## 2019-04-13 ENCOUNTER — Other Ambulatory Visit: Payer: Self-pay

## 2019-04-13 DIAGNOSIS — F3289 Other specified depressive episodes: Secondary | ICD-10-CM

## 2019-04-13 DIAGNOSIS — Z6841 Body Mass Index (BMI) 40.0 and over, adult: Secondary | ICD-10-CM

## 2019-04-13 DIAGNOSIS — E559 Vitamin D deficiency, unspecified: Secondary | ICD-10-CM

## 2019-04-14 NOTE — Progress Notes (Signed)
Office: [phone]  /  Fax: [phone] TeleHealth Visit:  [name] has verbally consented to this TeleHealth visit today. The patient is located at home, the provider is located at the [facility] office. The participants in this visit include the listed provider and patient. The visit was conducted today via doxy.me.  HPI:   Chief Complaint: OBESITY [name] is here to discuss her progress with her obesity treatment plan. She is on the Category 3 plan and is following her eating plan approximately 60 % of the time. She states she is walking and doing floor exercises and squats for 30 minutes 5 times per week. [name] has been doing better following her plan and she feels that she has lost another 3 pounds since her last visit. She states that her hunger is controlled and she is doing better avoiding boredom eating.  We were unable to weigh the patient today for this TeleHealth visit. She feels as if she has lost weight since her last visit. She has lost 20 lbs since starting treatment with [place].  Depression with emotional eating behaviors [name] has been stable on Wellbutrin, but has been having hot flashes. She feels certain that this is related to the Wellbutrin. [name] would like to discontinue the medicine and see how she does off her medication. She has been working on behavior modification techniques to help reduce her emotional eating and has been somewhat successful.   ASSESSMENT AND PLAN:  Other depression - with emotional eating  Class 3 severe obesity with serious comorbidity and body mass index (BMI) of 40.0 to 44.9 in adult, unspecified obesity type ([place])  PLAN:  Depression with Emotional Eating Behaviors We discussed behavior modification techniques today to help [name] deal with her emotional eating and depression. [name] is ok to discontinue Wellbutrin and we will continue to monitor her progress. She agreed to follow up as directed in 2 weeks.   I spent > than 50% of the 25 minute visit on counseling as documented in the note.  Obesity [name] is currently in the action stage of change. As such, her goal is to continue with weight loss efforts. She has agreed to follow the Category 3 plan. [name] has been instructed to work up to a goal of 150 minutes of combined cardio and strengthening exercise per week for weight loss and overall health benefits. We discussed the following Behavioral Modification Strategies today: increasing lean protein intake, decreasing simple carbohydrates, no skipping meals, and work on meal planning and easy cooking plans.  [name] has agreed to follow up with our clinic in 2 weeks. She was informed of the importance of frequent follow up visits to maximize her success with intensive lifestyle modifications for her multiple health conditions.  ALLERGIES: Allergies  Allergen Reactions  . Hydrochlorothiazide     REACTION: cramps  . Phentermine Hcl     REACTION: dizzy  . Valsartan     REACTION: side effect- lightheadedness  . Verapamil     REACTION: ? lightheaded    MEDICATIONS: Current Outpatient Medications on File Prior to Visit  Medication Sig Dispense Refill  . buPROPion (WELLBUTRIN SR) 150 MG 12 hr tablet Take 1 tablet (150 mg total) by mouth daily. 90 tablet 3  . carvedilol (COREG) 25 MG tablet Take 1 tablet (25 mg total) by mouth 2 (two) times daily with a meal. 180 tablet 3  . fluticasone furoate-vilanterol (BREO ELLIPTA) 100-25 MCG/INH AEPB Inhale 1 puff into the lungs daily. 3 each  3  . furosemide (LASIX) 20 MG tablet Take 1 tablet (20 mg total) by mouth daily. 90 tablet 3  . loratadine (CLARITIN) 10 MG tablet Take 1 tablet (10 mg total) by mouth daily. 90 tablet 3  . montelukast (SINGULAIR) 10 MG tablet Take 1 tablet (10 mg total) by mouth daily. 90 tablet 3  . triamcinolone cream (KENALOG) 0.5 % Apply 1 application topically 3 (three) times daily. 120 g 3  . Vitamin D, Ergocalciferol,  (DRISDOL) 1.25 MG (50000 UT) CAPS capsule Take 1 capsule (50,000 Units total) by mouth every 7 (seven) days. 4 capsule 0   No current facility-administered medications on file prior to visit.     PAST MEDICAL HISTORY: Past Medical History:  Diagnosis Date  . Abnormal glucose 2009  . Allergic rhinitis   . Back pain   . COPD (chronic obstructive pulmonary disease) ([place])   . Dyspnea   . Food allergy   . GERD (gastroesophageal reflux disease)   . Headache    Migraines (rare)  . HTN (hypertension)   . Lactose intolerance   . Leg edema   . Osteoarthritis    Right Hip - SEVERE    PAST SURGICAL HISTORY: Past Surgical History:  Procedure Laterality Date  . CHOLECYSTECTOMY    . COLONOSCOPY    . hole repair in heart     2004 at [place]   . HYSTEROSCOPY W/D&C N/A 04/04/2016   Procedure: DILATATION AND CURETTAGE /HYSTEROSCOPY, POLYPECTOMY WITH MYOSURE;  Surgeon: Olga Millers, MD;  Location: [facility];  Service: Gynecology;  Laterality: N/A;  . KNEE ARTHROSCOPY     left   . TOTAL HIP ARTHROPLASTY Right 07/20/2013   Procedure: RIGHT TOTAL HIP ARTHROPLASTY ANTERIOR APPROACH;  Surgeon: Mauri Pole, MD;  Location: [facility];  Service: Orthopedics;  Laterality: Right;  . TUBAL LIGATION    . WISDOM TOOTH EXTRACTION      SOCIAL HISTORY: Social History   Tobacco Use  . Smoking status: Former Smoker    Packs/day: 0.50    Years: 40.00    Pack years: 20.00    Types: Cigarettes    Last attempt to quit: 12/09/2011    Years since quitting: 7.3  . Smokeless tobacco: Never Used  . Tobacco comment: 2009  Substance Use Topics  . Alcohol use: Yes    Alcohol/week: 7.0 standard drinks    Types: 7 Glasses of wine per week    Comment: 1 glass of wine nightly  . Drug use: No    FAMILY HISTORY: Family History  Problem Relation Age of Onset  . Parkinsonism Mother   . Hypertension Mother   . Hyperlipidemia Mother   . Hypertension Unknown     ROS: Review of Systems  Psychiatric/Behavioral:  Positive for depression.    PHYSICAL EXAM: Pt in no acute distress  RECENT LABS AND TESTS: BMET    Component Value Date/Time   NA 140 12/28/2018 1645   NA 142 09/17/2018 1114   K 4.2 12/28/2018 1645   CL 101 12/28/2018 1645   CO2 32 12/28/2018 1645   GLUCOSE 91 12/28/2018 1645   BUN 14 12/28/2018 1645   BUN 15 09/17/2018 1114   CREATININE 1.13 12/28/2018 1645   CALCIUM 10.1 12/28/2018 1645   GFRNONAA 47 (L) 09/17/2018 1114   GFRAA 54 (L) 09/17/2018 1114   Lab Results  Component Value Date   HGBA1C 6.0 12/28/2018   HGBA1C 5.8 (H) 09/17/2018   HGBA1C 6.0 (H) 05/26/2018   HGBA1C 5.9  08/28/2015   HGBA1C 6.3 10/26/2010   Lab Results  Component Value Date   INSULIN 14.2 09/17/2018   INSULIN 10.1 05/26/2018   CBC    Component Value Date/Time   WBC 6.0 12/28/2018 1645   RBC 4.37 12/28/2018 1645   HGB 12.9 12/28/2018 1645   HCT 39.7 12/28/2018 1645   PLT 233.0 12/28/2018 1645   MCV 90.9 12/28/2018 1645   MCH 30.3 03/26/2016 0905   MCHC 32.5 12/28/2018 1645   RDW 13.8 12/28/2018 1645   LYMPHSABS 1.8 12/28/2018 1645   MONOABS 0.6 12/28/2018 1645   EOSABS 0.3 12/28/2018 1645   BASOSABS 0.1 12/28/2018 1645   Iron/TIBC/Ferritin/ %Sat No results found for: IRON, TIBC, FERRITIN, IRONPCTSAT Lipid Panel     Component Value Date/Time   CHOL 183 12/28/2018 1645   CHOL 173 09/17/2018 1114   TRIG 144.0 12/28/2018 1645   HDL 38.80 (L) 12/28/2018 1645   HDL 40 09/17/2018 1114   CHOLHDL 5 12/28/2018 1645   VLDL 28.8 12/28/2018 1645   LDLCALC 116 (H) 12/28/2018 1645   LDLCALC 118 (H) 09/17/2018 1114   Hepatic Function Panel     Component Value Date/Time   PROT 7.5 12/28/2018 1645   PROT 6.9 09/17/2018 1114   ALBUMIN 4.1 12/28/2018 1645   ALBUMIN 4.3 09/17/2018 1114   AST 13 12/28/2018 1645   ALT 12 12/28/2018 1645   ALKPHOS 63 12/28/2018 1645   BILITOT 0.4 12/28/2018 1645   BILITOT 0.3 09/17/2018 1114   BILIDIR 0.0 12/28/2018 1645      Component Value  Date/Time   TSH 1.36 12/28/2018 1645   TSH 0.96 03/30/2018 1642   TSH 1.19 03/03/2017 1122   Results for JAVEN, RIDINGS (MRN [id number]) as of 04/14/2019 10:09  Ref. Range 09/17/2018 11:14  Vitamin D, 25-Hydroxy Latest Ref Range: 30.0 - 100.0 ng/mL 59.0     I, Marcille Blanco, CMA, am acting as transcriptionist for Starlyn Skeans, MD I have reviewed the above documentation for accuracy and completeness, and I agree with the above. -Dennard Nip, MD

## 2019-04-17 ENCOUNTER — Other Ambulatory Visit (INDEPENDENT_AMBULATORY_CARE_PROVIDER_SITE_OTHER): Payer: Self-pay | Admitting: Family Medicine

## 2019-04-17 DIAGNOSIS — E559 Vitamin D deficiency, unspecified: Secondary | ICD-10-CM

## 2019-04-28 ENCOUNTER — Ambulatory Visit (INDEPENDENT_AMBULATORY_CARE_PROVIDER_SITE_OTHER): Payer: Managed Care, Other (non HMO) | Admitting: Family Medicine

## 2019-04-28 ENCOUNTER — Encounter (INDEPENDENT_AMBULATORY_CARE_PROVIDER_SITE_OTHER): Payer: Self-pay | Admitting: Family Medicine

## 2019-04-28 ENCOUNTER — Other Ambulatory Visit: Payer: Self-pay

## 2019-04-28 DIAGNOSIS — E559 Vitamin D deficiency, unspecified: Secondary | ICD-10-CM

## 2019-04-28 DIAGNOSIS — F3289 Other specified depressive episodes: Secondary | ICD-10-CM | POA: Diagnosis not present

## 2019-04-28 DIAGNOSIS — E66813 Obesity, class 3: Secondary | ICD-10-CM

## 2019-04-28 DIAGNOSIS — Z6841 Body Mass Index (BMI) 40.0 and over, adult: Secondary | ICD-10-CM

## 2019-04-28 MED ORDER — VITAMIN D (ERGOCALCIFEROL) 1.25 MG (50000 UNIT) PO CAPS: 50000.0000 [IU] | ORAL_CAPSULE | ORAL | 0 refills | Status: DC

## 2019-04-29 NOTE — Progress Notes (Signed)
Office: [phone]  /  Fax: [phone] TeleHealth Visit:  [name] has verbally consented to this TeleHealth visit today. The patient is located at home, the provider is located at the [facility] office. The participants in this visit include the listed provider and patient. The visit was conducted today via doxy.me.  HPI:   Chief Complaint: OBESITY [name] is here to discuss her progress with her obesity treatment plan. She is on the Category 3 plan and is following her eating plan approximately 50 % of the time. She states she is doing squats, stretches, and walking in place for 30 minutes 3 times per week. [name] is doing well maintaining her weight, but has been struggling with increased work stress and the possibility of being laid off at work.  We were unable to weigh the patient today for this TeleHealth visit. She feels as if she has gained weight since her last visit. She has lost 20 lbs since starting treatment with [place].  Vitamin D Deficiency [name] has a diagnosis of vitamin D deficiency. She is currently stable on vit D and is now at goal. [name] denies nausea, vomiting, or muscle weakness.  Depression with emotional eating behaviors [name] has stopped her Wellbutrin due to feeling it contributed to hot flashes. She feels that her hot flashes have done better, but is struggling with some stress eating. She is struggling with emotional eating and using food for comfort to the extent that it is negatively impacting her health. She often snacks when she is not hungry. [name] sometimes feels she is out of control and then feels guilty that she made poor food choices. She has been working on behavior modification techniques to help reduce her emotional eating and has been somewhat successful.   ASSESSMENT AND PLAN:  Vitamin D deficiency - Plan: Vitamin D, Ergocalciferol, (DRISDOL) 1.25 MG (50000 UT) CAPS capsule  Other depression - with emotional eating  Class 3 severe obesity with serious comorbidity and body mass index (BMI) of 40.0 to 44.9 in adult, unspecified obesity type ([place])  PLAN:  Vitamin D Deficiency [name] was informed that low vitamin D levels contribute to fatigue and are associated with obesity, breast, and colon cancer. [name] agrees to continue to take prescription Vit D @50 ,000 IU every week #4 with no refills and will follow up for routine testing of vitamin D, at least 2-3 times per year. She was informed of the risk of over-replacement of vitamin D and agrees to not increase her dose unless she discusses this with [place] first. [name] agrees to follow up in 2 weeks as directed.  Depression with Emotional Eating Behaviors We discussed cognitive behavior modification techniques today to help Kanyla decrease her emotional eating and depression. She agreed to follow up as directed.  Obesity [name] is currently in the action stage of change. As such, her goal is to continue with weight loss efforts. She has agreed to follow the Category 3 plan. Baylin has been instructed to work up to a goal of 150 minutes of combined cardio and strengthening exercise per week for weight loss and overall health benefits. We discussed the following Behavioral Modification Strategies today: work on meal planning and easy cooking plans, better snacking choices, and emotional eating strategies.  [name] has agreed to follow up with our clinic in 2 weeks. She was informed of the importance of frequent follow up visits to maximize her success with intensive lifestyle modifications for her multiple health conditions.  ALLERGIES: Allergies  Allergen Reactions  . Hydrochlorothiazide     REACTION: cramps  . Phentermine Hcl     REACTION: dizzy  . Valsartan     REACTION: side effect- lightheadedness  . Verapamil     REACTION: ? lightheaded    MEDICATIONS: Current Outpatient Medications on File Prior to Visit  Medication Sig Dispense Refill   . buPROPion (WELLBUTRIN SR) 150 MG 12 hr tablet Take 1 tablet (150 mg total) by mouth daily. 90 tablet 3  . carvedilol (COREG) 25 MG tablet Take 1 tablet (25 mg total) by mouth 2 (two) times daily with a meal. 180 tablet 3  . fluticasone furoate-vilanterol (BREO ELLIPTA) 100-25 MCG/INH AEPB Inhale 1 puff into the lungs daily. 3 each 3  . furosemide (LASIX) 20 MG tablet Take 1 tablet (20 mg total) by mouth daily. 90 tablet 3  . loratadine (CLARITIN) 10 MG tablet Take 1 tablet (10 mg total) by mouth daily. 90 tablet 3  . montelukast (SINGULAIR) 10 MG tablet Take 1 tablet (10 mg total) by mouth daily. 90 tablet 3  . triamcinolone cream (KENALOG) 0.5 % Apply 1 application topically 3 (three) times daily. 120 g 3   No current facility-administered medications on file prior to visit.     PAST MEDICAL HISTORY: Past Medical History:  Diagnosis Date  . Abnormal glucose 2009  . Allergic rhinitis   . Back pain   . COPD (chronic obstructive pulmonary disease) ([place])   . Dyspnea   . Food allergy   . GERD (gastroesophageal reflux disease)   . Headache    Migraines (rare)  . HTN (hypertension)   . Lactose intolerance   . Leg edema   . Osteoarthritis    Right Hip - SEVERE    PAST SURGICAL HISTORY: Past Surgical History:  Procedure Laterality Date  . CHOLECYSTECTOMY    . COLONOSCOPY    . hole repair in heart     2004 at [place]   . HYSTEROSCOPY W/D&C N/A 04/04/2016   Procedure: DILATATION AND CURETTAGE /HYSTEROSCOPY, POLYPECTOMY WITH MYOSURE;  Surgeon: Olga Millers, MD;  Location: [facility];  Service: Gynecology;  Laterality: N/A;  . KNEE ARTHROSCOPY     left   . TOTAL HIP ARTHROPLASTY Right 07/20/2013   Procedure: RIGHT TOTAL HIP ARTHROPLASTY ANTERIOR APPROACH;  Surgeon: Mauri Pole, MD;  Location: [facility];  Service: Orthopedics;  Laterality: Right;  . TUBAL LIGATION    . WISDOM TOOTH EXTRACTION      SOCIAL HISTORY: Social History   Tobacco Use  . Smoking status: Former Smoker     Packs/day: 0.50    Years: 40.00    Pack years: 20.00    Types: Cigarettes    Last attempt to quit: 12/09/2011    Years since quitting: 7.3  . Smokeless tobacco: Never Used  . Tobacco comment: 2009  Substance Use Topics  . Alcohol use: Yes    Alcohol/week: 7.0 standard drinks    Types: 7 Glasses of wine per week    Comment: 1 glass of wine nightly  . Drug use: No    FAMILY HISTORY: Family History  Problem Relation Age of Onset  . Parkinsonism Mother   . Hypertension Mother   . Hyperlipidemia Mother   . Hypertension Unknown     ROS: Review of Systems  Gastrointestinal: Negative for nausea and vomiting.  Musculoskeletal:       Negative for muscle weakness.  Psychiatric/Behavioral: Positive for depression.    PHYSICAL EXAM: Pt in no acute  distress  RECENT LABS AND TESTS: BMET    Component Value Date/Time   NA 140 12/28/2018 1645   NA 142 09/17/2018 1114   K 4.2 12/28/2018 1645   CL 101 12/28/2018 1645   CO2 32 12/28/2018 1645   GLUCOSE 91 12/28/2018 1645   BUN 14 12/28/2018 1645   BUN 15 09/17/2018 1114   CREATININE 1.13 12/28/2018 1645   CALCIUM 10.1 12/28/2018 1645   GFRNONAA 47 (L) 09/17/2018 1114   GFRAA 54 (L) 09/17/2018 1114   Lab Results  Component Value Date   HGBA1C 6.0 12/28/2018   HGBA1C 5.8 (H) 09/17/2018   HGBA1C 6.0 (H) 05/26/2018   HGBA1C 5.9 08/28/2015   HGBA1C 6.3 10/26/2010   Lab Results  Component Value Date   INSULIN 14.2 09/17/2018   INSULIN 10.1 05/26/2018   CBC    Component Value Date/Time   WBC 6.0 12/28/2018 1645   RBC 4.37 12/28/2018 1645   HGB 12.9 12/28/2018 1645   HCT 39.7 12/28/2018 1645   PLT 233.0 12/28/2018 1645   MCV 90.9 12/28/2018 1645   MCH 30.3 03/26/2016 0905   MCHC 32.5 12/28/2018 1645   RDW 13.8 12/28/2018 1645   LYMPHSABS 1.8 12/28/2018 1645   MONOABS 0.6 12/28/2018 1645   EOSABS 0.3 12/28/2018 1645   BASOSABS 0.1 12/28/2018 1645   Iron/TIBC/Ferritin/ %Sat No results found for: IRON, TIBC,  FERRITIN, IRONPCTSAT Lipid Panel     Component Value Date/Time   CHOL 183 12/28/2018 1645   CHOL 173 09/17/2018 1114   TRIG 144.0 12/28/2018 1645   HDL 38.80 (L) 12/28/2018 1645   HDL 40 09/17/2018 1114   CHOLHDL 5 12/28/2018 1645   VLDL 28.8 12/28/2018 1645   LDLCALC 116 (H) 12/28/2018 1645   LDLCALC 118 (H) 09/17/2018 1114   Hepatic Function Panel     Component Value Date/Time   PROT 7.5 12/28/2018 1645   PROT 6.9 09/17/2018 1114   ALBUMIN 4.1 12/28/2018 1645   ALBUMIN 4.3 09/17/2018 1114   AST 13 12/28/2018 1645   ALT 12 12/28/2018 1645   ALKPHOS 63 12/28/2018 1645   BILITOT 0.4 12/28/2018 1645   BILITOT 0.3 09/17/2018 1114   BILIDIR 0.0 12/28/2018 1645      Component Value Date/Time   TSH 1.36 12/28/2018 1645   TSH 0.96 03/30/2018 1642   TSH 1.19 03/03/2017 1122    Results for MARIVEL, MCCLARTY (MRN [id number]) as of 04/29/2019 12:56  Ref. Range 09/17/2018 11:14  Vitamin D, 25-Hydroxy Latest Ref Range: 30.0 - 100.0 ng/mL 59.0    I, Marcille Blanco, CMA, am acting as transcriptionist for Starlyn Skeans, MD I have reviewed the above documentation for accuracy and completeness, and I agree with the above. -Dennard Nip, MD

## 2019-05-13 ENCOUNTER — Other Ambulatory Visit: Payer: Self-pay

## 2019-05-13 ENCOUNTER — Encounter (INDEPENDENT_AMBULATORY_CARE_PROVIDER_SITE_OTHER): Payer: Self-pay | Admitting: Family Medicine

## 2019-05-13 ENCOUNTER — Ambulatory Visit (INDEPENDENT_AMBULATORY_CARE_PROVIDER_SITE_OTHER): Payer: Managed Care, Other (non HMO) | Admitting: Family Medicine

## 2019-05-13 DIAGNOSIS — Z6841 Body Mass Index (BMI) 40.0 and over, adult: Secondary | ICD-10-CM | POA: Diagnosis not present

## 2019-05-13 DIAGNOSIS — I10 Essential (primary) hypertension: Secondary | ICD-10-CM

## 2019-05-13 DIAGNOSIS — E559 Vitamin D deficiency, unspecified: Secondary | ICD-10-CM

## 2019-05-13 MED ORDER — VITAMIN D (ERGOCALCIFEROL) 1.25 MG (50000 UNIT) PO CAPS: 50000.0000 [IU] | ORAL_CAPSULE | ORAL | 0 refills | Status: DC

## 2019-05-17 NOTE — Progress Notes (Signed)
Office: [phone]  /  Fax: [phone] TeleHealth Visit:  [name] has verbally consented to this TeleHealth visit today. The patient is located at home, the provider is located at the [facility] office. The participants in this visit include the listed provider and patient. The visit was conducted today via [email or web address].  HPI:   Chief Complaint: OBESITY [name] is here to discuss her progress with her obesity treatment plan. She is on the Category 3 plan and is following her eating plan approximately 50 % of the time. She states she is doing squats and floor exercises for 30 minutes 3 times per week. [name] states she is doing well maintaining her weight. She has started doing exercises at home. She sometimes skips meals, but is trying to do better. She is finding more selections at the grocery stores again.  We were unable to weigh the patient today for this TeleHealth visit. She feels as if she has maintained her weight since her last visit. She has lost 20 lbs since starting treatment with [place].  Vitamin D Deficiency [name] has a diagnosis of vitamin D deficiency. She is stable on prescription Vit D and level is now at goal. She denies nausea, vomiting or muscle weakness.  Hypertension [name] is a 63 y.o. female with hypertension. [name]'s blood pressure is stable on Coreg. She denies chest pain, headaches, or dizziness. She is doing well on diet prescription, adding exercise, and working on weight loss to help control her blood pressure with the goal of decreasing her risk of heart attack and stroke.   ASSESSMENT AND PLAN:  Vitamin D deficiency - Plan: Vitamin D, Ergocalciferol, (DRISDOL) 1.25 MG (50000 UT) CAPS capsule  Essential hypertension  Class 3 severe obesity with serious comorbidity and body mass index (BMI) of 40.0 to 44.9 in adult, unspecified obesity type ([place])  PLAN:  Vitamin D Deficiency [name] was informed that low vitamin D levels  contributes to fatigue and are associated with obesity, breast, and colon cancer. [name] agrees to continue taking prescription Vit D @50 ,000 IU every week #4 and we will refill for 1 month. She will follow up for routine testing of vitamin D, at least 2-3 times per year. She was informed of the risk of over-replacement of vitamin D and agrees to not increase her dose unless she discusses this with [place] first. [name] agrees to follow up with our clinic in 2 weeks.  Hypertension We discussed sodium restriction, working on healthy weight loss, and a regular exercise program as the means to achieve improved blood pressure control. [name] agreed with this plan and agreed to follow up as directed. We will continue to monitor her blood pressure as well as her progress with the above lifestyle modifications. [name] agrees to continue her medications, diet, and exercise. She will watch for signs of hypotension as she continues her lifestyle modifications. [name] agrees to follow up with our clinic in 2 weeks.  Obesity [name] is currently in the action stage of change. As such, her goal is to continue with weight loss efforts She has agreed to follow the Category 3 plan [name] has been instructed to work up to a goal of 150 minutes of combined cardio and strengthening exercise per week for weight loss and overall health benefits. We discussed the following Behavioral Modification Strategies today: increasing lean protein intake, decreasing simple carbohydrates  and work on meal planning and easy cooking plans, no skipping meals   [name] has agreed to  follow up with our clinic in 2 weeks. She was informed of the importance of frequent follow up visits to maximize her success with intensive lifestyle modifications for her multiple health conditions.  ALLERGIES: Allergies  Allergen Reactions  . Hydrochlorothiazide     REACTION: cramps  . Phentermine Hcl     REACTION: dizzy  . Valsartan     REACTION:  side effect- lightheadedness  . Verapamil     REACTION: ? lightheaded    MEDICATIONS: Current Outpatient Medications on File Prior to Visit  Medication Sig Dispense Refill  . buPROPion (WELLBUTRIN SR) 150 MG 12 hr tablet Take 1 tablet (150 mg total) by mouth daily. 90 tablet 3  . carvedilol (COREG) 25 MG tablet Take 1 tablet (25 mg total) by mouth 2 (two) times daily with a meal. 180 tablet 3  . fluticasone furoate-vilanterol (BREO ELLIPTA) 100-25 MCG/INH AEPB Inhale 1 puff into the lungs daily. 3 each 3  . furosemide (LASIX) 20 MG tablet Take 1 tablet (20 mg total) by mouth daily. 90 tablet 3  . loratadine (CLARITIN) 10 MG tablet Take 1 tablet (10 mg total) by mouth daily. 90 tablet 3  . montelukast (SINGULAIR) 10 MG tablet Take 1 tablet (10 mg total) by mouth daily. 90 tablet 3  . triamcinolone cream (KENALOG) 0.5 % Apply 1 application topically 3 (three) times daily. 120 g 3   No current facility-administered medications on file prior to visit.     PAST MEDICAL HISTORY: Past Medical History:  Diagnosis Date  . Abnormal glucose 2009  . Allergic rhinitis   . Back pain   . COPD (chronic obstructive pulmonary disease) ([place])   . Dyspnea   . Food allergy   . GERD (gastroesophageal reflux disease)   . Headache    Migraines (rare)  . HTN (hypertension)   . Lactose intolerance   . Leg edema   . Osteoarthritis    Right Hip - SEVERE    PAST SURGICAL HISTORY: Past Surgical History:  Procedure Laterality Date  . CHOLECYSTECTOMY    . COLONOSCOPY    . hole repair in heart     2004 at [facility]   . HYSTEROSCOPY W/D&C N/A 04/04/2016   Procedure: DILATATION AND CURETTAGE /HYSTEROSCOPY, POLYPECTOMY WITH MYOSURE;  Surgeon: Olga Millers, MD;  Location: [facility];  Service: Gynecology;  Laterality: N/A;  . KNEE ARTHROSCOPY     left   . TOTAL HIP ARTHROPLASTY Right 07/20/2013   Procedure: RIGHT TOTAL HIP ARTHROPLASTY ANTERIOR APPROACH;  Surgeon: Mauri Pole, MD;  Location: [facility];  Service:  Orthopedics;  Laterality: Right;  . TUBAL LIGATION    . WISDOM TOOTH EXTRACTION      SOCIAL HISTORY: Social History   Tobacco Use  . Smoking status: Former Smoker    Packs/day: 0.50    Years: 40.00    Pack years: 20.00    Types: Cigarettes    Last attempt to quit: 12/09/2011    Years since quitting: 7.4  . Smokeless tobacco: Never Used  . Tobacco comment: 2009  Substance Use Topics  . Alcohol use: Yes    Alcohol/week: 7.0 standard drinks    Types: 7 Glasses of wine per week    Comment: 1 glass of wine nightly  . Drug use: No    FAMILY HISTORY: Family History  Problem Relation Age of Onset  . Parkinsonism Mother   . Hypertension Mother   . Hyperlipidemia Mother   . Hypertension Unknown     ROS:  Review of Systems  Constitutional: Negative for weight loss.  Cardiovascular: Negative for chest pain.  Gastrointestinal: Negative for nausea and vomiting.  Musculoskeletal:       Negative muscle weakness  Neurological: Negative for dizziness and headaches.    PHYSICAL EXAM: Pt in no acute distress  RECENT LABS AND TESTS: BMET    Component Value Date/Time   NA 140 12/28/2018 1645   NA 142 09/17/2018 1114   K 4.2 12/28/2018 1645   CL 101 12/28/2018 1645   CO2 32 12/28/2018 1645   GLUCOSE 91 12/28/2018 1645   BUN 14 12/28/2018 1645   BUN 15 09/17/2018 1114   CREATININE 1.13 12/28/2018 1645   CALCIUM 10.1 12/28/2018 1645   GFRNONAA 47 (L) 09/17/2018 1114   GFRAA 54 (L) 09/17/2018 1114   Lab Results  Component Value Date   HGBA1C 6.0 12/28/2018   HGBA1C 5.8 (H) 09/17/2018   HGBA1C 6.0 (H) 05/26/2018   HGBA1C 5.9 08/28/2015   HGBA1C 6.3 10/26/2010   Lab Results  Component Value Date   INSULIN 14.2 09/17/2018   INSULIN 10.1 05/26/2018   CBC    Component Value Date/Time   WBC 6.0 12/28/2018 1645   RBC 4.37 12/28/2018 1645   HGB 12.9 12/28/2018 1645   HCT 39.7 12/28/2018 1645   PLT 233.0 12/28/2018 1645   MCV 90.9 12/28/2018 1645   MCH 30.3  03/26/2016 0905   MCHC 32.5 12/28/2018 1645   RDW 13.8 12/28/2018 1645   LYMPHSABS 1.8 12/28/2018 1645   MONOABS 0.6 12/28/2018 1645   EOSABS 0.3 12/28/2018 1645   BASOSABS 0.1 12/28/2018 1645   Iron/TIBC/Ferritin/ %Sat No results found for: IRON, TIBC, FERRITIN, IRONPCTSAT Lipid Panel     Component Value Date/Time   CHOL 183 12/28/2018 1645   CHOL 173 09/17/2018 1114   TRIG 144.0 12/28/2018 1645   HDL 38.80 (L) 12/28/2018 1645   HDL 40 09/17/2018 1114   CHOLHDL 5 12/28/2018 1645   VLDL 28.8 12/28/2018 1645   LDLCALC 116 (H) 12/28/2018 1645   LDLCALC 118 (H) 09/17/2018 1114   Hepatic Function Panel     Component Value Date/Time   PROT 7.5 12/28/2018 1645   PROT 6.9 09/17/2018 1114   ALBUMIN 4.1 12/28/2018 1645   ALBUMIN 4.3 09/17/2018 1114   AST 13 12/28/2018 1645   ALT 12 12/28/2018 1645   ALKPHOS 63 12/28/2018 1645   BILITOT 0.4 12/28/2018 1645   BILITOT 0.3 09/17/2018 1114   BILIDIR 0.0 12/28/2018 1645      Component Value Date/Time   TSH 1.36 12/28/2018 1645   TSH 0.96 03/30/2018 1642   TSH 1.19 03/03/2017 1122      I, Trixie Dredge, am acting as Location manager for Dennard Nip, MD I have reviewed the above documentation for accuracy and completeness, and I agree with the above. -Dennard Nip, MD

## 2019-05-20 ENCOUNTER — Other Ambulatory Visit (INDEPENDENT_AMBULATORY_CARE_PROVIDER_SITE_OTHER): Payer: Self-pay | Admitting: Family Medicine

## 2019-05-20 DIAGNOSIS — E559 Vitamin D deficiency, unspecified: Secondary | ICD-10-CM

## 2019-05-25 DIAGNOSIS — E559 Vitamin D deficiency, unspecified: Secondary | ICD-10-CM | POA: Insufficient documentation

## 2019-05-27 ENCOUNTER — Ambulatory Visit (INDEPENDENT_AMBULATORY_CARE_PROVIDER_SITE_OTHER): Payer: Managed Care, Other (non HMO) | Admitting: Family Medicine

## 2019-06-02 ENCOUNTER — Other Ambulatory Visit: Payer: Self-pay

## 2019-06-02 ENCOUNTER — Telehealth (INDEPENDENT_AMBULATORY_CARE_PROVIDER_SITE_OTHER): Payer: Managed Care, Other (non HMO) | Admitting: Family Medicine

## 2019-06-02 DIAGNOSIS — Z6841 Body Mass Index (BMI) 40.0 and over, adult: Secondary | ICD-10-CM | POA: Diagnosis not present

## 2019-06-02 DIAGNOSIS — E559 Vitamin D deficiency, unspecified: Secondary | ICD-10-CM | POA: Diagnosis not present

## 2019-06-02 MED ORDER — VITAMIN D (ERGOCALCIFEROL) 1.25 MG (50000 UNIT) PO CAPS: 50000.0000 [IU] | ORAL_CAPSULE | ORAL | 0 refills | Status: DC

## 2019-06-03 NOTE — Progress Notes (Signed)
Office: [phone]  /  Fax: [phone] TeleHealth Visit:  [name] has verbally consented to this TeleHealth visit today. The patient is located at home, the provider is located at the [facility] [employer] [facility] office. The participants in this visit include the listed provider and patient. The visit was conducted today via face time.  HPI:   Chief Complaint: OBESITY [name] is here to discuss her progress with her obesity treatment plan. She is on the Category 3 plan and is following her eating plan approximately 20 % of the time. She states she is walking in place 150 steps 3 times a day, 30 push ups 2-3 times a day, and 20 squats 2 times a day. [name] continues to do well maintaining her weight. She has increased her exercise and is already feeling stronger with improved exercise intolerance. She is working longer hours at work and has less time for meal planning.  We were unable to weigh the patient today for this TeleHealth visit. She feels as if she has maintained her weight since her last visit. She has lost 20 lbs since starting treatment with [place].  Vitamin D Deficiency [name] has a diagnosis of vitamin D deficiency. She is stable on prescription Vit D and last labs were at goal. She denies nausea, vomiting or muscle weakness.  ASSESSMENT AND PLAN:  Vitamin D deficiency - Plan: Vitamin D, Ergocalciferol, (DRISDOL) 1.25 MG (50000 UT) CAPS capsule  Class 3 severe obesity with serious comorbidity and body mass index (BMI) of 40.0 to 44.9 in adult, unspecified obesity type ([place])  PLAN:  Vitamin D Deficiency [name] was informed that low vitamin D levels contributes to fatigue and are associated with obesity, breast, and colon cancer. [name] agrees to continue taking prescription Vit D 50,000 IU every week #4 and we will refill for 1 month. She will follow up for routine testing of vitamin D, at least 2-3 times per year. She was informed of the risk of over-replacement of  vitamin D and agrees to not increase her dose unless she discusses this with [place] first. [name] agrees to follow up with our clinic in 2 to 3 weeks.  Obesity [name] is currently in the action stage of change. As such, her goal is to maintain weight for now, and then get back to weight loss when things settle back into a more normal routine She has agreed to follow the Category 3 plan [name] has been instructed to work up to a goal of 150 minutes of combined cardio and strengthening exercise per week for weight loss and overall health benefits. We discussed the following Behavioral Modification Strategies today: work on meal planning and easy cooking plans, emotional eating strategies and ways to avoid boredom eating   [name] has agreed to follow up with our clinic in 2 to 3 weeks. She was informed of the importance of frequent follow up visits to maximize her success with intensive lifestyle modifications for her multiple health conditions.  ALLERGIES: Allergies  Allergen Reactions  . Hydrochlorothiazide     REACTION: cramps  . Phentermine Hcl     REACTION: dizzy  . Valsartan     REACTION: side effect- lightheadedness  . Verapamil     REACTION: ? lightheaded    MEDICATIONS: Current Outpatient Medications on File Prior to Visit  Medication Sig Dispense Refill  . buPROPion (WELLBUTRIN SR) 150 MG 12 hr tablet Take 1 tablet (150 mg total) by mouth daily. 90 tablet 3  . carvedilol (COREG) 25  MG tablet Take 1 tablet (25 mg total) by mouth 2 (two) times daily with a meal. 180 tablet 3  . fluticasone furoate-vilanterol (BREO ELLIPTA) 100-25 MCG/INH AEPB Inhale 1 puff into the lungs daily. 3 each 3  . furosemide (LASIX) 20 MG tablet Take 1 tablet (20 mg total) by mouth daily. 90 tablet 3  . loratadine (CLARITIN) 10 MG tablet Take 1 tablet (10 mg total) by mouth daily. 90 tablet 3  . montelukast (SINGULAIR) 10 MG tablet Take 1 tablet (10 mg total) by mouth daily. 90 tablet 3  .  triamcinolone cream (KENALOG) 0.5 % Apply 1 application topically 3 (three) times daily. 120 g 3   No current facility-administered medications on file prior to visit.     PAST MEDICAL HISTORY: Past Medical History:  Diagnosis Date  . Abnormal glucose 2009  . Allergic rhinitis   . Back pain   . COPD (chronic obstructive pulmonary disease) ([place])   . Dyspnea   . Food allergy   . GERD (gastroesophageal reflux disease)   . Headache    Migraines (rare)  . HTN (hypertension)   . Lactose intolerance   . Leg edema   . Osteoarthritis    Right Hip - SEVERE    PAST SURGICAL HISTORY: Past Surgical History:  Procedure Laterality Date  . CHOLECYSTECTOMY    . COLONOSCOPY    . hole repair in heart     2004 at [place]   . HYSTEROSCOPY W/D&C N/A 04/04/2016   Procedure: DILATATION AND CURETTAGE /HYSTEROSCOPY, POLYPECTOMY WITH MYOSURE;  Surgeon: Olga Millers, MD;  Location: [facility];  Service: Gynecology;  Laterality: N/A;  . KNEE ARTHROSCOPY     left   . TOTAL HIP ARTHROPLASTY Right 07/20/2013   Procedure: RIGHT TOTAL HIP ARTHROPLASTY ANTERIOR APPROACH;  Surgeon: Mauri Pole, MD;  Location: [facility];  Service: Orthopedics;  Laterality: Right;  . TUBAL LIGATION    . WISDOM TOOTH EXTRACTION      SOCIAL HISTORY: Social History   Tobacco Use  . Smoking status: Former Smoker    Packs/day: 0.50    Years: 40.00    Pack years: 20.00    Types: Cigarettes    Quit date: 12/09/2011    Years since quitting: 7.4  . Smokeless tobacco: Never Used  . Tobacco comment: 2009  Substance Use Topics  . Alcohol use: Yes    Alcohol/week: 7.0 standard drinks    Types: 7 Glasses of wine per week    Comment: 1 glass of wine nightly  . Drug use: No    FAMILY HISTORY: Family History  Problem Relation Age of Onset  . Parkinsonism Mother   . Hypertension Mother   . Hyperlipidemia Mother   . Hypertension Unknown     ROS: Review of Systems  Constitutional: Negative for weight loss.   Gastrointestinal: Negative for nausea and vomiting.  Musculoskeletal:       Negative muscle weakness    PHYSICAL EXAM: Pt in no acute distress  RECENT LABS AND TESTS: BMET    Component Value Date/Time   NA 140 12/28/2018 1645   NA 142 09/17/2018 1114   K 4.2 12/28/2018 1645   CL 101 12/28/2018 1645   CO2 32 12/28/2018 1645   GLUCOSE 91 12/28/2018 1645   BUN 14 12/28/2018 1645   BUN 15 09/17/2018 1114   CREATININE 1.13 12/28/2018 1645   CALCIUM 10.1 12/28/2018 1645   GFRNONAA 47 (L) 09/17/2018 1114   GFRAA 54 (L) 09/17/2018 1114  Lab Results  Component Value Date   HGBA1C 6.0 12/28/2018   HGBA1C 5.8 (H) 09/17/2018   HGBA1C 6.0 (H) 05/26/2018   HGBA1C 5.9 08/28/2015   HGBA1C 6.3 10/26/2010   Lab Results  Component Value Date   INSULIN 14.2 09/17/2018   INSULIN 10.1 05/26/2018   CBC    Component Value Date/Time   WBC 6.0 12/28/2018 1645   RBC 4.37 12/28/2018 1645   HGB 12.9 12/28/2018 1645   HCT 39.7 12/28/2018 1645   PLT 233.0 12/28/2018 1645   MCV 90.9 12/28/2018 1645   MCH 30.3 03/26/2016 0905   MCHC 32.5 12/28/2018 1645   RDW 13.8 12/28/2018 1645   LYMPHSABS 1.8 12/28/2018 1645   MONOABS 0.6 12/28/2018 1645   EOSABS 0.3 12/28/2018 1645   BASOSABS 0.1 12/28/2018 1645   Iron/TIBC/Ferritin/ %Sat No results found for: IRON, TIBC, FERRITIN, IRONPCTSAT Lipid Panel     Component Value Date/Time   CHOL 183 12/28/2018 1645   CHOL 173 09/17/2018 1114   TRIG 144.0 12/28/2018 1645   HDL 38.80 (L) 12/28/2018 1645   HDL 40 09/17/2018 1114   CHOLHDL 5 12/28/2018 1645   VLDL 28.8 12/28/2018 1645   LDLCALC 116 (H) 12/28/2018 1645   LDLCALC 118 (H) 09/17/2018 1114   Hepatic Function Panel     Component Value Date/Time   PROT 7.5 12/28/2018 1645   PROT 6.9 09/17/2018 1114   ALBUMIN 4.1 12/28/2018 1645   ALBUMIN 4.3 09/17/2018 1114   AST 13 12/28/2018 1645   ALT 12 12/28/2018 1645   ALKPHOS 63 12/28/2018 1645   BILITOT 0.4 12/28/2018 1645   BILITOT 0.3  09/17/2018 1114   BILIDIR 0.0 12/28/2018 1645      Component Value Date/Time   TSH 1.36 12/28/2018 1645   TSH 0.96 03/30/2018 1642   TSH 1.19 03/03/2017 1122      I, Trixie Dredge, am acting as Location manager for Dennard Nip, MD I have reviewed the above documentation for accuracy and completeness, and I agree with the above. -Dennard Nip, MD

## 2019-06-14 ENCOUNTER — Other Ambulatory Visit (INDEPENDENT_AMBULATORY_CARE_PROVIDER_SITE_OTHER): Payer: Self-pay | Admitting: Family Medicine

## 2019-06-14 DIAGNOSIS — E559 Vitamin D deficiency, unspecified: Secondary | ICD-10-CM

## 2019-06-23 ENCOUNTER — Encounter (INDEPENDENT_AMBULATORY_CARE_PROVIDER_SITE_OTHER): Payer: Self-pay | Admitting: Family Medicine

## 2019-06-23 ENCOUNTER — Other Ambulatory Visit: Payer: Self-pay

## 2019-06-23 ENCOUNTER — Telehealth (INDEPENDENT_AMBULATORY_CARE_PROVIDER_SITE_OTHER): Payer: Managed Care, Other (non HMO) | Admitting: Family Medicine

## 2019-06-23 DIAGNOSIS — E7849 Other hyperlipidemia: Secondary | ICD-10-CM | POA: Diagnosis not present

## 2019-06-23 DIAGNOSIS — M791 Myalgia, unspecified site: Secondary | ICD-10-CM

## 2019-06-23 DIAGNOSIS — E559 Vitamin D deficiency, unspecified: Secondary | ICD-10-CM

## 2019-06-23 DIAGNOSIS — R7303 Prediabetes: Secondary | ICD-10-CM

## 2019-06-23 DIAGNOSIS — F3289 Other specified depressive episodes: Secondary | ICD-10-CM | POA: Diagnosis not present

## 2019-06-23 MED ORDER — VITAMIN D (ERGOCALCIFEROL) 1.25 MG (50000 UNIT) PO CAPS: 50000.0000 [IU] | ORAL_CAPSULE | ORAL | 0 refills | Status: DC

## 2019-06-23 MED ORDER — BUPROPION HCL ER (SR) 150 MG PO TB12: 150.0000 mg | ORAL_TABLET | Freq: Every day | ORAL | 3 refills | Status: DC

## 2019-06-28 ENCOUNTER — Other Ambulatory Visit: Payer: Self-pay

## 2019-06-28 ENCOUNTER — Ambulatory Visit (INDEPENDENT_AMBULATORY_CARE_PROVIDER_SITE_OTHER): Payer: Managed Care, Other (non HMO) | Admitting: Internal Medicine

## 2019-06-28 ENCOUNTER — Other Ambulatory Visit (INDEPENDENT_AMBULATORY_CARE_PROVIDER_SITE_OTHER): Payer: Managed Care, Other (non HMO)

## 2019-06-28 ENCOUNTER — Encounter: Payer: Self-pay | Admitting: Internal Medicine

## 2019-06-28 VITALS — BP 158/80 | HR 72 | Temp 98.3°F | Ht 64.0 in | Wt 256.0 lb

## 2019-06-28 DIAGNOSIS — I10 Essential (primary) hypertension: Secondary | ICD-10-CM | POA: Diagnosis not present

## 2019-06-28 DIAGNOSIS — R739 Hyperglycemia, unspecified: Secondary | ICD-10-CM

## 2019-06-28 DIAGNOSIS — Z6841 Body Mass Index (BMI) 40.0 and over, adult: Secondary | ICD-10-CM

## 2019-06-28 DIAGNOSIS — R5383 Other fatigue: Secondary | ICD-10-CM

## 2019-06-28 DIAGNOSIS — E559 Vitamin D deficiency, unspecified: Secondary | ICD-10-CM | POA: Diagnosis not present

## 2019-06-28 LAB — CBC WITH DIFFERENTIAL/PLATELET
Basophils Absolute: 0.1 10*3/uL (ref 0.0–0.1)
Basophils Relative: 0.9 % (ref 0.0–3.0)
Eosinophils Absolute: 0.3 10*3/uL (ref 0.0–0.7)
Eosinophils Relative: 4.2 % (ref 0.0–5.0)
HCT: 38.5 % (ref 36.0–46.0)
Hemoglobin: 12.6 g/dL (ref 12.0–15.0)
Lymphocytes Relative: 27.4 % (ref 12.0–46.0)
Lymphs Abs: 1.9 10*3/uL (ref 0.7–4.0)
MCHC: 32.7 g/dL (ref 30.0–36.0)
MCV: 91.8 fl (ref 78.0–100.0)
Monocytes Absolute: 0.8 10*3/uL (ref 0.1–1.0)
Monocytes Relative: 11.9 % (ref 3.0–12.0)
Neutro Abs: 3.8 10*3/uL (ref 1.4–7.7)
Neutrophils Relative %: 55.6 % (ref 43.0–77.0)
Platelets: 223 10*3/uL (ref 150.0–400.0)
RBC: 4.19 Mil/uL (ref 3.87–5.11)
RDW: 13.6 % (ref 11.5–15.5)
WBC: 6.9 10*3/uL (ref 4.0–10.5)

## 2019-06-28 LAB — HEMOGLOBIN A1C: Hgb A1c MFr Bld: 6 % (ref 4.6–6.5)

## 2019-06-28 NOTE — Addendum Note (Signed)
Addended by: Cresenciano Lick on: 06/28/2019 04:44 PM   Modules accepted: Orders

## 2019-06-28 NOTE — Assessment & Plan Note (Signed)
Coreg, Furosemide Offered cardiac CT scan for calcium scoring

## 2019-06-28 NOTE — Assessment & Plan Note (Signed)
Vit D 

## 2019-06-28 NOTE — Assessment & Plan Note (Signed)
A1c

## 2019-06-28 NOTE — Patient Instructions (Addendum)
   Cardiac CT calcium scoring test $150   Computed tomography, more commonly known as a CT or CAT scan, is a diagnostic medical imaging test. Like traditional x-rays, it produces multiple images or pictures of the inside of the body. The cross-sectional images generated during a CT scan can be reformatted in multiple planes. They can even generate three-dimensional images. These images can be viewed on a computer monitor, printed on film or by a 3D printer, or transferred to a CD or DVD. CT images of internal organs, bones, soft tissue and blood vessels provide greater detail than traditional x-rays, particularly of soft tissues and blood vessels. A cardiac CT scan for coronary calcium is a non-invasive way of obtaining information about the presence, location and extent of calcified plaque in the coronary arteries-the vessels that supply oxygen-containing blood to the heart muscle. Calcified plaque results when there is a build-up of fat and other substances under the inner layer of the artery. This material can calcify which signals the presence of atherosclerosis, a disease of the vessel wall, also called coronary artery disease (CAD). People with this disease have an increased risk for heart attacks. In addition, over time, progression of plaque build up (CAD) can narrow the arteries or even close off blood flow to the heart. The result may be chest pain, sometimes called "angina," or a heart attack. Because calcium is a marker of CAD, the amount of calcium detected on a cardiac CT scan is a helpful prognostic tool. The findings on cardiac CT are expressed as a calcium score. Another name for this test is coronary artery calcium scoring.  What are some common uses of the procedure? The goal of cardiac CT scan for calcium scoring is to determine if CAD is present and to what extent, even if there are no symptoms. It is a screening study that may be recommended by a physician for patients with risk  factors for CAD but no clinical symptoms. The major risk factors for CAD are: . high blood cholesterol levels  . family history of heart attacks  . diabetes  . high blood pressure  . cigarette smoking  . overweight or obese  . physical inactivity   A negative cardiac CT scan for calcium scoring shows no calcification within the coronary arteries. This suggests that CAD is absent or so minimal it cannot be seen by this technique. The chance of having a heart attack over the next two to five years is very low under these circumstances. A positive test means that CAD is present, regardless of whether or not the patient is experiencing any symptoms. The amount of calcification-expressed as the calcium score-may help to predict the likelihood of a myocardial infarction (heart attack) in the coming years and helps your medical doctor or cardiologist decide whether the patient may need to take preventive medicine or undertake other measures such as diet and exercise to lower the risk for heart attack. The extent of CAD is graded according to your calcium score:  Calcium Score  Presence of CAD (coronary artery disease)  0 No evidence of CAD   1-10 Minimal evidence of CAD  11-100 Mild evidence of CAD  101-400 Moderate evidence of CAD  Over 400 Extensive evidence of CAD

## 2019-06-28 NOTE — Progress Notes (Signed)
Subjective:  Patient ID: [name], female    DOB: September 13, 1956  Age: 63 y.o. MRN: [id number]  CC: No chief complaint on file.   HPI [name] presents for wt gain, HTN, OA f/u, asthma  Outpatient Medications Prior to Visit  Medication Sig Dispense Refill  . buPROPion (WELLBUTRIN SR) 150 MG 12 hr tablet Take 1 tablet (150 mg total) by mouth daily. 90 tablet 3  . carvedilol (COREG) 25 MG tablet Take 1 tablet (25 mg total) by mouth 2 (two) times daily with a meal. 180 tablet 3  . fluticasone furoate-vilanterol (BREO ELLIPTA) 100-25 MCG/INH AEPB Inhale 1 puff into the lungs daily. 3 each 3  . furosemide (LASIX) 20 MG tablet Take 1 tablet (20 mg total) by mouth daily. 90 tablet 3  . loratadine (CLARITIN) 10 MG tablet Take 1 tablet (10 mg total) by mouth daily. 90 tablet 3  . montelukast (SINGULAIR) 10 MG tablet Take 1 tablet (10 mg total) by mouth daily. 90 tablet 3  . triamcinolone cream (KENALOG) 0.5 % Apply 1 application topically 3 (three) times daily. 120 g 3  . Vitamin D, Ergocalciferol, (DRISDOL) 1.25 MG (50000 UT) CAPS capsule Take 1 capsule (50,000 Units total) by mouth every 7 (seven) days. 4 capsule 0   No facility-administered medications prior to visit.     ROS: Review of Systems  Constitutional: Positive for unexpected weight change. Negative for activity change, appetite change, chills and fatigue.  HENT: Negative for congestion, mouth sores and sinus pressure.   Eyes: Negative for visual disturbance.  Respiratory: Negative for cough and chest tightness.   Gastrointestinal: Negative for abdominal pain and nausea.  Genitourinary: Negative for difficulty urinating, frequency and vaginal pain.  Musculoskeletal: Positive for arthralgias. Negative for back pain and gait problem.  Skin: Negative for pallor and rash.  Neurological: Negative for dizziness, tremors, weakness, numbness and headaches.  Psychiatric/Behavioral: Negative for confusion and sleep disturbance.     Objective:  BP (!) 158/80 (BP Location: Left Arm, Patient Position: Sitting, Cuff Size: Large)   Pulse 72   Temp 98.3 F (36.8 C) (Oral)   Ht 5\' 4"  (1.626 m)   Wt 256 lb (116.1 kg)   SpO2 97%   BMI 43.94 kg/m   BP Readings from Last 3 Encounters:  06/28/19 (!) 158/80  02/09/19 128/79  01/19/19 125/74    Wt Readings from Last 3 Encounters:  06/28/19 256 lb (116.1 kg)  02/09/19 246 lb (111.6 kg)  01/19/19 242 lb (109.8 kg)    Physical Exam Constitutional:      General: She is not in acute distress.    Appearance: She is well-developed. She is obese.  HENT:     Head: Normocephalic.     Right Ear: External ear normal.     Left Ear: External ear normal.     Nose: Nose normal.  Eyes:     General:        Right eye: No discharge.        Left eye: No discharge.     Conjunctiva/sclera: Conjunctivae normal.     Pupils: Pupils are equal, round, and reactive to light.  Neck:     Musculoskeletal: Normal range of motion and neck supple.     Thyroid: No thyromegaly.     Vascular: No JVD.     Trachea: No tracheal deviation.  Cardiovascular:     Rate and Rhythm: Normal rate and regular rhythm.     Heart sounds: Normal heart  sounds.  Pulmonary:     Effort: No respiratory distress.     Breath sounds: No stridor. No wheezing.  Abdominal:     General: Bowel sounds are normal. There is no distension.     Palpations: Abdomen is soft. There is no mass.     Tenderness: There is no abdominal tenderness. There is no guarding or rebound.  Musculoskeletal:        General: No tenderness.  Lymphadenopathy:     Cervical: No cervical adenopathy.  Skin:    Findings: No erythema or rash.  Neurological:     Cranial Nerves: No cranial nerve deficit.     Motor: No abnormal muscle tone.     Coordination: Coordination normal.     Deep Tendon Reflexes: Reflexes normal.  Psychiatric:        Behavior: Behavior normal.        Thought Content: Thought content normal.        Judgment:  Judgment normal.     Lab Results  Component Value Date   WBC 6.0 12/28/2018   HGB 12.9 12/28/2018   HCT 39.7 12/28/2018   PLT 233.0 12/28/2018   GLUCOSE 91 12/28/2018   CHOL 183 12/28/2018   TRIG 144.0 12/28/2018   HDL 38.80 (L) 12/28/2018   LDLCALC 116 (H) 12/28/2018   ALT 12 12/28/2018   AST 13 12/28/2018   NA 140 12/28/2018   K 4.2 12/28/2018   CL 101 12/28/2018   CREATININE 1.13 12/28/2018   BUN 14 12/28/2018   CO2 32 12/28/2018   TSH 1.36 12/28/2018   INR 1.09 07/13/2013   HGBA1C 6.0 12/28/2018    No results found.  Assessment & Plan:   There are no diagnoses linked to this encounter.   No orders of the defined types were placed in this encounter.    Follow-up: No follow-ups on file.  Walker Kehr, MD

## 2019-06-28 NOTE — Progress Notes (Signed)
Office: [phone]  /  Fax: [phone] TeleHealth Visit:  [name] has verbally consented to this TeleHealth visit today. The patient is located at home, the provider is located at the [facility] office. The participants in this visit include the listed provider and patient and any and all parties involved. The visit was conducted today via [email or web address].  HPI:   Chief Complaint: OBESITY [name] is here to discuss her progress with her obesity treatment plan. She is on the Category 3 plan and is following her eating plan approximately 50 % of the time. She states she is doing upper body exercises 30 minutes 5 times per week. [name] feels she has done well with maintaining her weight. She has been doing arm exercises lately, but she hasn't done much walking, due to leg cramping. We were unable to weigh the patient today for this TeleHealth visit. She feels as if she has maintained weight since her last visit. She has lost 20 lbs since starting treatment with [place].  Vitamin D deficiency [name] has a diagnosis of vitamin D deficiency. [name] is stable on vit D, and she denies nausea, vomiting or muscle weakness. [name] is due for labs.  Depression with emotional eating behaviors [name] struggles with emotional eating and using food for comfort to the extent that it is negatively impacting her health. She often snacks when she is not hungry. [name] sometimes feels she is out of control and then feels guilty that she made poor food choices. [name] is stable on Wellbutrin and she denies insomnia, palpitations or tremors. She feels she is doing better with emotional eating. She has been working on behavior modification techniques to help reduce her emotional eating and has been somewhat successful. She shows no sign of suicidal or homicidal ideations.  Pre-Diabetes [name] has a diagnosis of prediabetes based on her elevated Hgb A1c and was informed this puts her at greater  risk of developing diabetes. Her last A1c was at 6.0 [name] is not on metformin currently, and she continues to work on diet, exercise and weight loss to decrease the risk of diabetes. [name] is due to have labs re-checked, but she hasn't yet, due to COVID-19 isolation. She denies nausea, vomiting or hypoglycemia.  Right Leg Cramping Patient notes right thigh cramping more often when she tries to exercise, and is worse in the last two weeks. This improves when she rests.  Hyperlipidemia [name] has hyperlipidemia and has been trying to improve her cholesterol levels with intensive lifestyle modification including a low saturated fat diet, exercise and weight loss. She denies any chest pain. [name] is due for labs.  ASSESSMENT AND PLAN:  Prediabetes - Plan: Hemoglobin A1c, Insulin, random  Vitamin D deficiency - Plan: Vitamin D, Ergocalciferol, (DRISDOL) 1.25 MG (50000 UT) CAPS capsule, VITAMIN D 25 Hydroxy (Vit-D Deficiency, Fractures)  Other depression - with emotional eating - Plan: buPROPion (WELLBUTRIN SR) 150 MG 12 hr tablet  Other hyperlipidemia - Plan: Lipid Panel With LDL/HDL Ratio  Myalgia - Plan: Comprehensive metabolic panel, Magnesium, CK  PLAN:  Vitamin D Deficiency [name] was informed that low vitamin D levels contributes to fatigue and are associated with obesity, breast, and colon cancer. She agrees to continue to take prescription Vit D @50 ,000 IU every week #4 with no refills and will follow up for routine testing of vitamin D, at least 2-3 times per year. She was informed of the risk of over-replacement of vitamin D and agrees to not increase her dose  unless she discusses this with [place] first. We will check labs and Channel agrees to follow up with our clinic in 2 weeks.  Depression with Emotional Eating Behaviors We discussed behavior modification techniques today to help [name] deal with her emotional eating and depression. She has agreed to continue Wellbutrin SR  150 mg daily #90 with 3 refills and follow up as directed.  Pre-Diabetes [name] will continue to work on weight loss, exercise, and decreasing simple carbohydrates in her diet to help decrease the risk of diabetes. She was informed that eating too many simple carbohydrates or too many calories at one sitting increases the likelihood of GI side effects. We will check labs and [name] agreed to follow up with [place] as directed to monitor her progress.  Right Leg Cramping We will check labs and [name] will increase water with electrolytes. This may be due to claudications as well. [name] will follow up with our clinic in 2 weeks.  Hyperlipidemia [name] was informed of the American Heart Association Guidelines emphasizing intensive lifestyle modifications as the first line treatment for hyperlipidemia. We discussed many lifestyle modifications today in depth, and [name] will continue to work on decreasing saturated fats such as fatty red meat, butter and many fried foods. She will also increase vegetables and lean protein in her diet and continue to work on exercise and weight loss efforts. We will check labs and Shalia will follow up at the agreed upon time.  Obesity [name] is currently in the action stage of change. As such, her goal is to continue with weight loss efforts She has agreed to follow the Category 3 plan [name] will continue her exercise regimen for weight loss and overall health benefits. We discussed the following Behavioral Modification Strategies today: increase H2O intake and increasing lean protein intake  [name] has agreed to follow up with our clinic in 2 weeks. She was informed of the importance of frequent follow up visits to maximize her success with intensive lifestyle modifications for her multiple health conditions.  ALLERGIES: Allergies  Allergen Reactions  . Hydrochlorothiazide     REACTION: cramps  . Phentermine Hcl     REACTION: dizzy  . Valsartan      REACTION: side effect- lightheadedness  . Verapamil     REACTION: ? lightheaded    MEDICATIONS: Current Outpatient Medications on File Prior to Visit  Medication Sig Dispense Refill  . carvedilol (COREG) 25 MG tablet Take 1 tablet (25 mg total) by mouth 2 (two) times daily with a meal. 180 tablet 3  . fluticasone furoate-vilanterol (BREO ELLIPTA) 100-25 MCG/INH AEPB Inhale 1 puff into the lungs daily. 3 each 3  . furosemide (LASIX) 20 MG tablet Take 1 tablet (20 mg total) by mouth daily. 90 tablet 3  . loratadine (CLARITIN) 10 MG tablet Take 1 tablet (10 mg total) by mouth daily. 90 tablet 3  . montelukast (SINGULAIR) 10 MG tablet Take 1 tablet (10 mg total) by mouth daily. 90 tablet 3  . triamcinolone cream (KENALOG) 0.5 % Apply 1 application topically 3 (three) times daily. 120 g 3   No current facility-administered medications on file prior to visit.     PAST MEDICAL HISTORY: Past Medical History:  Diagnosis Date  . Abnormal glucose 2009  . Allergic rhinitis   . Back pain   . COPD (chronic obstructive pulmonary disease) ([place])   . Dyspnea   . Food allergy   . GERD (gastroesophageal reflux disease)   . Headache    Migraines (  rare)  . HTN (hypertension)   . Lactose intolerance   . Leg edema   . Osteoarthritis    Right Hip - SEVERE    PAST SURGICAL HISTORY: Past Surgical History:  Procedure Laterality Date  . CHOLECYSTECTOMY    . COLONOSCOPY    . hole repair in heart     2004 at [place]   . HYSTEROSCOPY W/D&C N/A 04/04/2016   Procedure: DILATATION AND CURETTAGE /HYSTEROSCOPY, POLYPECTOMY WITH MYOSURE;  Surgeon: Olga Millers, MD;  Location: [facility];  Service: Gynecology;  Laterality: N/A;  . KNEE ARTHROSCOPY     left   . TOTAL HIP ARTHROPLASTY Right 07/20/2013   Procedure: RIGHT TOTAL HIP ARTHROPLASTY ANTERIOR APPROACH;  Surgeon: Mauri Pole, MD;  Location: [facility];  Service: Orthopedics;  Laterality: Right;  . TUBAL LIGATION    . WISDOM TOOTH EXTRACTION      SOCIAL  HISTORY: Social History   Tobacco Use  . Smoking status: Former Smoker    Packs/day: 0.50    Years: 40.00    Pack years: 20.00    Types: Cigarettes    Quit date: 12/09/2011    Years since quitting: 7.5  . Smokeless tobacco: Never Used  . Tobacco comment: 2009  Substance Use Topics  . Alcohol use: Yes    Alcohol/week: 7.0 standard drinks    Types: 7 Glasses of wine per week    Comment: 1 glass of wine nightly  . Drug use: No    FAMILY HISTORY: Family History  Problem Relation Age of Onset  . Parkinsonism Mother   . Hypertension Mother   . Hyperlipidemia Mother   . Hypertension Unknown     ROS: Review of Systems  Constitutional: Negative for weight loss.  Cardiovascular: Negative for chest pain and palpitations.  Gastrointestinal: Negative for nausea and vomiting.  Musculoskeletal:       Negative for muscle weakness Positive for leg cramping (right)  Neurological: Negative for tremors.  Endo/Heme/Allergies:       Negative for hypoglycemia  Psychiatric/Behavioral: Positive for depression. Negative for suicidal ideas. The patient does not have insomnia.     PHYSICAL EXAM: Pt in no acute distress  RECENT LABS AND TESTS: BMET    Component Value Date/Time   NA 140 12/28/2018 1645   NA 142 09/17/2018 1114   K 4.2 12/28/2018 1645   CL 101 12/28/2018 1645   CO2 32 12/28/2018 1645   GLUCOSE 91 12/28/2018 1645   BUN 14 12/28/2018 1645   BUN 15 09/17/2018 1114   CREATININE 1.13 12/28/2018 1645   CALCIUM 10.1 12/28/2018 1645   GFRNONAA 47 (L) 09/17/2018 1114   GFRAA 54 (L) 09/17/2018 1114   Lab Results  Component Value Date   HGBA1C 6.0 12/28/2018   HGBA1C 5.8 (H) 09/17/2018   HGBA1C 6.0 (H) 05/26/2018   HGBA1C 5.9 08/28/2015   HGBA1C 6.3 10/26/2010   Lab Results  Component Value Date   INSULIN 14.2 09/17/2018   INSULIN 10.1 05/26/2018   CBC    Component Value Date/Time   WBC 6.0 12/28/2018 1645   RBC 4.37 12/28/2018 1645   HGB 12.9 12/28/2018 1645    HCT 39.7 12/28/2018 1645   PLT 233.0 12/28/2018 1645   MCV 90.9 12/28/2018 1645   MCH 30.3 03/26/2016 0905   MCHC 32.5 12/28/2018 1645   RDW 13.8 12/28/2018 1645   LYMPHSABS 1.8 12/28/2018 1645   MONOABS 0.6 12/28/2018 1645   EOSABS 0.3 12/28/2018 1645   BASOSABS 0.1 12/28/2018 1645  Iron/TIBC/Ferritin/ %Sat No results found for: IRON, TIBC, FERRITIN, IRONPCTSAT Lipid Panel     Component Value Date/Time   CHOL 183 12/28/2018 1645   CHOL 173 09/17/2018 1114   TRIG 144.0 12/28/2018 1645   HDL 38.80 (L) 12/28/2018 1645   HDL 40 09/17/2018 1114   CHOLHDL 5 12/28/2018 1645   VLDL 28.8 12/28/2018 1645   LDLCALC 116 (H) 12/28/2018 1645   LDLCALC 118 (H) 09/17/2018 1114   Hepatic Function Panel     Component Value Date/Time   PROT 7.5 12/28/2018 1645   PROT 6.9 09/17/2018 1114   ALBUMIN 4.1 12/28/2018 1645   ALBUMIN 4.3 09/17/2018 1114   AST 13 12/28/2018 1645   ALT 12 12/28/2018 1645   ALKPHOS 63 12/28/2018 1645   BILITOT 0.4 12/28/2018 1645   BILITOT 0.3 09/17/2018 1114   BILIDIR 0.0 12/28/2018 1645      Component Value Date/Time   TSH 1.36 12/28/2018 1645   TSH 0.96 03/30/2018 1642   TSH 1.19 03/03/2017 1122     Ref. Range 09/17/2018 11:14  Vitamin D, 25-Hydroxy Latest Ref Range: 30.0 - 100.0 ng/mL 59.0    I, Doreene Nest, am acting as Location manager for Dennard Nip, MD I have reviewed the above documentation for accuracy and completeness, and I agree with the above. -Dennard Nip, MD

## 2019-06-28 NOTE — Assessment & Plan Note (Signed)
Diet discussed 

## 2019-06-29 ENCOUNTER — Encounter (INDEPENDENT_AMBULATORY_CARE_PROVIDER_SITE_OTHER): Payer: Self-pay | Admitting: Family Medicine

## 2019-06-29 LAB — BASIC METABOLIC PANEL
BUN: 18 mg/dL (ref 6–23)
CO2: 30 mEq/L (ref 19–32)
Calcium: 9.4 mg/dL (ref 8.4–10.5)
Chloride: 103 mEq/L (ref 96–112)
Creatinine, Ser: 1.19 mg/dL (ref 0.40–1.20)
GFR: 45.61 mL/min — ABNORMAL LOW (ref >60.00)
Glucose, Bld: 83 mg/dL (ref 70–99)
Potassium: 4.2 mEq/L (ref 3.5–5.1)
Sodium: 140 mEq/L (ref 135–145)

## 2019-06-29 LAB — HEPATIC FUNCTION PANEL
ALT: 14 U/L (ref 0–35)
AST: 14 U/L (ref 0–37)
Albumin: 4.1 g/dL (ref 3.5–5.2)
Alkaline Phosphatase: 56 U/L (ref 39–117)
Bilirubin, Direct: 0 mg/dL (ref 0.0–0.3)
Total Bilirubin: 0.4 mg/dL (ref 0.2–1.2)
Total Protein: 7.2 g/dL (ref 6.0–8.3)

## 2019-06-29 LAB — VITAMIN D 25 HYDROXY (VIT D DEFICIENCY, FRACTURES): VITD: 52.09 ng/mL (ref 30.00–100.00)

## 2019-06-29 LAB — TSH: TSH: 0.86 u[IU]/mL (ref 0.35–4.50)

## 2019-06-29 NOTE — Telephone Encounter (Signed)
Please advise 

## 2019-06-30 ENCOUNTER — Other Ambulatory Visit: Payer: Self-pay | Admitting: Internal Medicine

## 2019-06-30 ENCOUNTER — Encounter: Payer: Self-pay | Admitting: Internal Medicine

## 2019-06-30 DIAGNOSIS — N183 Chronic kidney disease, stage 3 unspecified: Secondary | ICD-10-CM | POA: Insufficient documentation

## 2019-07-14 ENCOUNTER — Other Ambulatory Visit: Payer: Self-pay

## 2019-07-14 ENCOUNTER — Encounter (INDEPENDENT_AMBULATORY_CARE_PROVIDER_SITE_OTHER): Payer: Self-pay | Admitting: Family Medicine

## 2019-07-14 ENCOUNTER — Telehealth (INDEPENDENT_AMBULATORY_CARE_PROVIDER_SITE_OTHER): Payer: Managed Care, Other (non HMO) | Admitting: Family Medicine

## 2019-07-14 ENCOUNTER — Other Ambulatory Visit (INDEPENDENT_AMBULATORY_CARE_PROVIDER_SITE_OTHER): Payer: Self-pay | Admitting: Family Medicine

## 2019-07-14 DIAGNOSIS — E559 Vitamin D deficiency, unspecified: Secondary | ICD-10-CM | POA: Diagnosis not present

## 2019-07-14 DIAGNOSIS — N189 Chronic kidney disease, unspecified: Secondary | ICD-10-CM | POA: Diagnosis not present

## 2019-07-14 DIAGNOSIS — Z6841 Body Mass Index (BMI) 40.0 and over, adult: Secondary | ICD-10-CM | POA: Diagnosis not present

## 2019-07-14 MED ORDER — VITAMIN D (ERGOCALCIFEROL) 1.25 MG (50000 UNIT) PO CAPS: 50000.0000 [IU] | ORAL_CAPSULE | ORAL | 0 refills | Status: DC

## 2019-07-15 NOTE — Progress Notes (Signed)
Office: [phone]  /  Fax: [phone] TeleHealth Visit:  [name] has verbally consented to this TeleHealth visit today. The patient is located at home, the provider is located at the [facility] office. The participants in this visit include the listed provider and patient. The visit was conducted today via [email or web address].  HPI:   Chief Complaint: OBESITY [name] is here to discuss her progress with her obesity treatment plan. She is on the Category 3 plan and is following her eating plan approximately 50 % of the time. She states she is doing leg exercises, squats, and walking in place for 30 minutes 3 times per week. [name] feels she has maintained her weight, but she had a physical last month which showed approximately 10 lb weight gain since the COVID-19 pandemic.  We were unable to weigh the patient today for this TeleHealth visit. She feels as if she has maintained her weight since her last visit. She has lost 20 lbs since starting treatment with [place].  Vitamin D Deficiency [name] has a diagnosis of vitamin D deficiency. She is stable on prescription Vit D, level is at goal. She denies nausea, vomiting or muscle weakness.  Chronic Renal Impairment [name] GFR has been a bit low for over 1 year. The GFR for African American's is a bit below 60. Dr. Alain Marion ordered a renal ultrasound and she was unsure why.  ASSESSMENT AND PLAN:  Chronic renal impairment, unspecified CKD stage  Vitamin D deficiency - Plan: Vitamin D, Ergocalciferol, (DRISDOL) 1.25 MG (50000 UT) CAPS capsule  Class 3 severe obesity with serious comorbidity and body mass index (BMI) of 40.0 to 44.9 in adult, unspecified obesity type ([place])  PLAN:  Vitamin D Deficiency [name] was informed that low vitamin D levels contributes to fatigue and are associated with obesity, breast, and colon cancer. [name] agrees to continue taking prescription Vit D 50,000 IU every week #4 and we will refill for 1  month. She will follow up for routine testing of vitamin D, at least 2-3 times per year. She was informed of the risk of over-replacement of vitamin D and agrees to not increase her dose unless she discusses this with [place] first. [name] agrees to follow up with our clinic in 2 to 3 weeks.  Chronic Renal Impairment [name] was encouraged to get the ultrasound as ordered. I educated her on the importance of diet and weight loss, and blood pressure control for kidney health. She agreed to have the test done and will follow up.  Obesity [name] is currently in the action stage of change. As such, her goal is to continue with weight loss efforts She has agreed to follow the Category 3 plan [name] has been instructed to work up to a goal of 150 minutes of combined cardio and strengthening exercise per week for weight loss and overall health benefits. We discussed the following Behavioral Modification Strategies today: decreasing simple carbohydrates, decreasing sodium intake, increase H20 intake, and better snacking choices   [name] has agreed to follow up with our clinic in 2 to 3 weeks. She was informed of the importance of frequent follow up visits to maximize her success with intensive lifestyle modifications for her multiple health conditions.  ALLERGIES: Allergies  Allergen Reactions  . Hydrochlorothiazide     REACTION: cramps  . Phentermine Hcl     REACTION: dizzy  . Valsartan     REACTION: side effect- lightheadedness  . Verapamil     REACTION: ? lightheaded  MEDICATIONS: Current Outpatient Medications on File Prior to Visit  Medication Sig Dispense Refill  . buPROPion (WELLBUTRIN SR) 150 MG 12 hr tablet Take 1 tablet (150 mg total) by mouth daily. 90 tablet 3  . carvedilol (COREG) 25 MG tablet Take 1 tablet (25 mg total) by mouth 2 (two) times daily with a meal. 180 tablet 3  . fluticasone furoate-vilanterol (BREO ELLIPTA) 100-25 MCG/INH AEPB Inhale 1 puff into the lungs daily.  3 each 3  . furosemide (LASIX) 20 MG tablet Take 1 tablet (20 mg total) by mouth daily. 90 tablet 3  . loratadine (CLARITIN) 10 MG tablet Take 1 tablet (10 mg total) by mouth daily. 90 tablet 3  . montelukast (SINGULAIR) 10 MG tablet Take 1 tablet (10 mg total) by mouth daily. 90 tablet 3  . triamcinolone cream (KENALOG) 0.5 % Apply 1 application topically 3 (three) times daily. 120 g 3   No current facility-administered medications on file prior to visit.     PAST MEDICAL HISTORY: Past Medical History:  Diagnosis Date  . Abnormal glucose 2009  . Allergic rhinitis   . Back pain   . COPD (chronic obstructive pulmonary disease) ([place])   . Dyspnea   . Food allergy   . GERD (gastroesophageal reflux disease)   . Headache    Migraines (rare)  . HTN (hypertension)   . Lactose intolerance   . Leg edema   . Osteoarthritis    Right Hip - SEVERE    PAST SURGICAL HISTORY: Past Surgical History:  Procedure Laterality Date  . CHOLECYSTECTOMY    . COLONOSCOPY    . hole repair in heart     2004 at [facility]   . HYSTEROSCOPY W/D&C N/A 04/04/2016   Procedure: DILATATION AND CURETTAGE /HYSTEROSCOPY, POLYPECTOMY WITH MYOSURE;  Surgeon: Olga Millers, MD;  Location: [facility];  Service: Gynecology;  Laterality: N/A;  . KNEE ARTHROSCOPY     left   . TOTAL HIP ARTHROPLASTY Right 07/20/2013   Procedure: RIGHT TOTAL HIP ARTHROPLASTY ANTERIOR APPROACH;  Surgeon: Mauri Pole, MD;  Location: [facility];  Service: Orthopedics;  Laterality: Right;  . TUBAL LIGATION    . WISDOM TOOTH EXTRACTION      SOCIAL HISTORY: Social History   Tobacco Use  . Smoking status: Former Smoker    Packs/day: 0.50    Years: 40.00    Pack years: 20.00    Types: Cigarettes    Quit date: 12/09/2011    Years since quitting: 7.6  . Smokeless tobacco: Never Used  . Tobacco comment: 2009  Substance Use Topics  . Alcohol use: Yes    Alcohol/week: 7.0 standard drinks    Types: 7 Glasses of wine per week    Comment: 1 glass  of wine nightly  . Drug use: No    FAMILY HISTORY: Family History  Problem Relation Age of Onset  . Parkinsonism Mother   . Hypertension Mother   . Hyperlipidemia Mother   . Hypertension Unknown     ROS: Review of Systems  Constitutional: Negative for weight loss.  Gastrointestinal: Negative for nausea and vomiting.  Musculoskeletal:       Negative muscle weakness    PHYSICAL EXAM: Pt in no acute distress  RECENT LABS AND TESTS: BMET    Component Value Date/Time   NA 140 06/28/2019 1647   NA 142 09/17/2018 1114   K 4.2 06/28/2019 1647   CL 103 06/28/2019 1647   CO2 30 06/28/2019 1647   GLUCOSE 83  06/28/2019 1647   BUN 18 06/28/2019 1647   BUN 15 09/17/2018 1114   CREATININE 1.19 06/28/2019 1647   CALCIUM 9.4 06/28/2019 1647   GFRNONAA 47 (L) 09/17/2018 1114   GFRAA 54 (L) 09/17/2018 1114   Lab Results  Component Value Date   HGBA1C 6.0 06/28/2019   HGBA1C 6.0 12/28/2018   HGBA1C 5.8 (H) 09/17/2018   HGBA1C 6.0 (H) 05/26/2018   HGBA1C 5.9 08/28/2015   Lab Results  Component Value Date   INSULIN 14.2 09/17/2018   INSULIN 10.1 05/26/2018   CBC    Component Value Date/Time   WBC 6.9 06/28/2019 1647   RBC 4.19 06/28/2019 1647   HGB 12.6 06/28/2019 1647   HCT 38.5 06/28/2019 1647   PLT 223.0 06/28/2019 1647   MCV 91.8 06/28/2019 1647   MCH 30.3 03/26/2016 0905   MCHC 32.7 06/28/2019 1647   RDW 13.6 06/28/2019 1647   LYMPHSABS 1.9 06/28/2019 1647   MONOABS 0.8 06/28/2019 1647   EOSABS 0.3 06/28/2019 1647   BASOSABS 0.1 06/28/2019 1647   Iron/TIBC/Ferritin/ %Sat No results found for: IRON, TIBC, FERRITIN, IRONPCTSAT Lipid Panel     Component Value Date/Time   CHOL 183 12/28/2018 1645   CHOL 173 09/17/2018 1114   TRIG 144.0 12/28/2018 1645   HDL 38.80 (L) 12/28/2018 1645   HDL 40 09/17/2018 1114   CHOLHDL 5 12/28/2018 1645   VLDL 28.8 12/28/2018 1645   LDLCALC 116 (H) 12/28/2018 1645   LDLCALC 118 (H) 09/17/2018 1114   Hepatic Function  Panel     Component Value Date/Time   PROT 7.2 06/28/2019 1647   PROT 6.9 09/17/2018 1114   ALBUMIN 4.1 06/28/2019 1647   ALBUMIN 4.3 09/17/2018 1114   AST 14 06/28/2019 1647   ALT 14 06/28/2019 1647   ALKPHOS 56 06/28/2019 1647   BILITOT 0.4 06/28/2019 1647   BILITOT 0.3 09/17/2018 1114   BILIDIR 0.0 06/28/2019 1647      Component Value Date/Time   TSH 0.86 06/28/2019 1647   TSH 1.36 12/28/2018 1645   TSH 0.96 03/30/2018 1642      I, Trixie Dredge, am acting as Location manager for Dennard Nip, MD I have reviewed the above documentation for accuracy and completeness, and I agree with the above. -Dennard Nip, MD

## 2019-07-19 ENCOUNTER — Encounter: Payer: Self-pay | Admitting: Internal Medicine

## 2019-08-04 ENCOUNTER — Other Ambulatory Visit: Payer: Self-pay

## 2019-08-04 ENCOUNTER — Encounter (INDEPENDENT_AMBULATORY_CARE_PROVIDER_SITE_OTHER): Payer: Self-pay | Admitting: Family Medicine

## 2019-08-04 ENCOUNTER — Telehealth (INDEPENDENT_AMBULATORY_CARE_PROVIDER_SITE_OTHER): Payer: Managed Care, Other (non HMO) | Admitting: Family Medicine

## 2019-08-04 ENCOUNTER — Ambulatory Visit
Admission: RE | Admit: 2019-08-04 | Discharge: 2019-08-04 | Disposition: A | Discharge status: Home / Self Care | Payer: Managed Care, Other (non HMO) | Source: Ambulatory Visit | Attending: Internal Medicine | Admitting: Internal Medicine

## 2019-08-04 DIAGNOSIS — Z6841 Body Mass Index (BMI) 40.0 and over, adult: Secondary | ICD-10-CM

## 2019-08-04 DIAGNOSIS — M25562 Pain in left knee: Secondary | ICD-10-CM

## 2019-08-04 DIAGNOSIS — E559 Vitamin D deficiency, unspecified: Secondary | ICD-10-CM | POA: Diagnosis not present

## 2019-08-04 DIAGNOSIS — N183 Chronic kidney disease, stage 3 unspecified: Secondary | ICD-10-CM

## 2019-08-04 MED ORDER — VITAMIN D (ERGOCALCIFEROL) 1.25 MG (50000 UNIT) PO CAPS: 50000.0000 [IU] | ORAL_CAPSULE | ORAL | 0 refills | Status: DC

## 2019-08-05 NOTE — Progress Notes (Signed)
Office: [phone]  /  Fax: [phone] TeleHealth Visit:  [name] has verbally consented to this TeleHealth visit today. The patient is located at home, the provider is located at the [facility] office. The participants in this visit include the listed provider and patient. The visit was conducted today via doxy.me.  HPI:   Chief Complaint: OBESITY [name] is here to discuss her progress with her obesity treatment plan. She is on the Category 3 plan and is following her eating plan approximately 50 % of the time. She states she is exercising 0 minutes 0 times per week. [name] states she is doing well maintaining her weight. She is not following her plan strictly, mostly portion control and smarter choices. She has had 3 deaths in her family this month and has gotten off track with the change in her routine.  We were unable to weigh the patient today for this TeleHealth visit. She feels as if she has gained weight since her last visit. She has lost 20 lbs since starting treatment with [place].  Vitamin D Deficiency [name] has a diagnosis of vitamin D deficiency. She is stable on prescription Vit D, last level at goal. She denies nausea, vomiting or muscle weakness.  Left Knee Pain [name] notes a sudden pain in her left knee that radiates down her calf, causing calf pain and tightness. She denies swelling or heat or redness, but climbing the stairs was very painful. She denies any trauma.  ASSESSMENT AND PLAN:  Acute pain of left knee  Vitamin D deficiency - Plan: Vitamin D, Ergocalciferol, (DRISDOL) 1.25 MG (50000 UT) CAPS capsule  Class 3 severe obesity with serious comorbidity and body mass index (BMI) of 40.0 to 44.9 in adult, unspecified obesity type ([place])  PLAN:  Vitamin D Deficiency [name] was informed that low vitamin D levels contributes to fatigue and are associated with obesity, breast, and colon cancer. [name] agrees to continue taking prescription  Vit D 50,000 IU every week #4 and we will refill for 1 month. She will follow up for routine testing of vitamin D, at least 2-3 times per year. She was informed of the risk of over-replacement of vitamin D and agrees to not increase her dose unless she discusses this with [place] first. [name] agrees to follow up with our clinic in 3 weeks.  Left Knee Pain [name] was advised to contact Dr. Alain Marion today as this may be a blood clot, which could be very dangerous and need treatment. She agreed to do so. [name] agrees to follow up with our clinic in 3 weeks.  Obesity [name] is currently in the action stage of change. As such, her goal is to maintain weight for now She has agreed to follow the Category 3 plan  [name] is to get back on track when she's able, and will continue to maintain her weight in the meanwhile. [name] has been instructed to work up to a goal of 150 minutes of combined cardio and strengthening exercise per week for weight loss and overall health benefits. We discussed the following Behavioral Modification Strategies today: increasing lean protein intake and emotional eating strategies   [name] has agreed to follow up with our clinic in 3 weeks. She was informed of the importance of frequent follow up visits to maximize her success with intensive lifestyle modifications for her multiple health conditions.  ALLERGIES: Allergies  Allergen Reactions  . Hydrochlorothiazide     REACTION: cramps  . Phentermine Hcl  REACTION: dizzy  . Valsartan     REACTION: side effect- lightheadedness  . Verapamil     REACTION: ? lightheaded    MEDICATIONS: Current Outpatient Medications on File Prior to Visit  Medication Sig Dispense Refill  . buPROPion (WELLBUTRIN SR) 150 MG 12 hr tablet Take 1 tablet (150 mg total) by mouth daily. 90 tablet 3  . carvedilol (COREG) 25 MG tablet Take 1 tablet (25 mg total) by mouth 2 (two) times daily with a meal. 180 tablet 3  . fluticasone  furoate-vilanterol (BREO ELLIPTA) 100-25 MCG/INH AEPB Inhale 1 puff into the lungs daily. 3 each 3  . furosemide (LASIX) 20 MG tablet Take 1 tablet (20 mg total) by mouth daily. 90 tablet 3  . loratadine (CLARITIN) 10 MG tablet Take 1 tablet (10 mg total) by mouth daily. 90 tablet 3  . montelukast (SINGULAIR) 10 MG tablet Take 1 tablet (10 mg total) by mouth daily. 90 tablet 3  . triamcinolone cream (KENALOG) 0.5 % Apply 1 application topically 3 (three) times daily. 120 g 3   No current facility-administered medications on file prior to visit.     PAST MEDICAL HISTORY: Past Medical History:  Diagnosis Date  . Abnormal glucose 2009  . Allergic rhinitis   . Back pain   . COPD (chronic obstructive pulmonary disease) ([place])   . Dyspnea   . Food allergy   . GERD (gastroesophageal reflux disease)   . Headache    Migraines (rare)  . HTN (hypertension)   . Lactose intolerance   . Leg edema   . Osteoarthritis    Right Hip - SEVERE    PAST SURGICAL HISTORY: Past Surgical History:  Procedure Laterality Date  . CHOLECYSTECTOMY    . COLONOSCOPY    . hole repair in heart     2004 at [facility]   . HYSTEROSCOPY W/D&C N/A 04/04/2016   Procedure: DILATATION AND CURETTAGE /HYSTEROSCOPY, POLYPECTOMY WITH MYOSURE;  Surgeon: Olga Millers, MD;  Location: [facility];  Service: Gynecology;  Laterality: N/A;  . KNEE ARTHROSCOPY     left   . TOTAL HIP ARTHROPLASTY Right 07/20/2013   Procedure: RIGHT TOTAL HIP ARTHROPLASTY ANTERIOR APPROACH;  Surgeon: Mauri Pole, MD;  Location: [facility];  Service: Orthopedics;  Laterality: Right;  . TUBAL LIGATION    . WISDOM TOOTH EXTRACTION      SOCIAL HISTORY: Social History   Tobacco Use  . Smoking status: Former Smoker    Packs/day: 0.50    Years: 40.00    Pack years: 20.00    Types: Cigarettes    Quit date: 12/09/2011    Years since quitting: 7.6  . Smokeless tobacco: Never Used  . Tobacco comment: 2009  Substance Use Topics  . Alcohol use: Yes     Alcohol/week: 7.0 standard drinks    Types: 7 Glasses of wine per week    Comment: 1 glass of wine nightly  . Drug use: No    FAMILY HISTORY: Family History  Problem Relation Age of Onset  . Parkinsonism Mother   . Hypertension Mother   . Hyperlipidemia Mother   . Hypertension Unknown     ROS: Review of Systems  Constitutional: Negative for weight loss.  Cardiovascular:       + Left knee and calf pain  Gastrointestinal: Negative for nausea and vomiting.  Musculoskeletal:       Negative muscle weakness    PHYSICAL EXAM: Pt in no acute distress  RECENT LABS AND TESTS: BMET  Component Value Date/Time   NA 140 06/28/2019 1647   NA 142 09/17/2018 1114   K 4.2 06/28/2019 1647   CL 103 06/28/2019 1647   CO2 30 06/28/2019 1647   GLUCOSE 83 06/28/2019 1647   BUN 18 06/28/2019 1647   BUN 15 09/17/2018 1114   CREATININE 1.19 06/28/2019 1647   CALCIUM 9.4 06/28/2019 1647   GFRNONAA 47 (L) 09/17/2018 1114   GFRAA 54 (L) 09/17/2018 1114   Lab Results  Component Value Date   HGBA1C 6.0 06/28/2019   HGBA1C 6.0 12/28/2018   HGBA1C 5.8 (H) 09/17/2018   HGBA1C 6.0 (H) 05/26/2018   HGBA1C 5.9 08/28/2015   Lab Results  Component Value Date   INSULIN 14.2 09/17/2018   INSULIN 10.1 05/26/2018   CBC    Component Value Date/Time   WBC 6.9 06/28/2019 1647   RBC 4.19 06/28/2019 1647   HGB 12.6 06/28/2019 1647   HCT 38.5 06/28/2019 1647   PLT 223.0 06/28/2019 1647   MCV 91.8 06/28/2019 1647   MCH 30.3 03/26/2016 0905   MCHC 32.7 06/28/2019 1647   RDW 13.6 06/28/2019 1647   LYMPHSABS 1.9 06/28/2019 1647   MONOABS 0.8 06/28/2019 1647   EOSABS 0.3 06/28/2019 1647   BASOSABS 0.1 06/28/2019 1647   Iron/TIBC/Ferritin/ %Sat No results found for: IRON, TIBC, FERRITIN, IRONPCTSAT Lipid Panel     Component Value Date/Time   CHOL 183 12/28/2018 1645   CHOL 173 09/17/2018 1114   TRIG 144.0 12/28/2018 1645   HDL 38.80 (L) 12/28/2018 1645   HDL 40 09/17/2018 1114    CHOLHDL 5 12/28/2018 1645   VLDL 28.8 12/28/2018 1645   LDLCALC 116 (H) 12/28/2018 1645   LDLCALC 118 (H) 09/17/2018 1114   Hepatic Function Panel     Component Value Date/Time   PROT 7.2 06/28/2019 1647   PROT 6.9 09/17/2018 1114   ALBUMIN 4.1 06/28/2019 1647   ALBUMIN 4.3 09/17/2018 1114   AST 14 06/28/2019 1647   ALT 14 06/28/2019 1647   ALKPHOS 56 06/28/2019 1647   BILITOT 0.4 06/28/2019 1647   BILITOT 0.3 09/17/2018 1114   BILIDIR 0.0 06/28/2019 1647      Component Value Date/Time   TSH 0.86 06/28/2019 1647   TSH 1.36 12/28/2018 1645   TSH 0.96 03/30/2018 1642      I, Trixie Dredge, am acting as Location manager for Dennard Nip, MD I have reviewed the above documentation for accuracy and completeness, and I agree with the above. -Dennard Nip, MD

## 2019-08-06 ENCOUNTER — Other Ambulatory Visit: Payer: Self-pay | Admitting: Internal Medicine

## 2019-08-06 DIAGNOSIS — D494 Neoplasm of unspecified behavior of bladder: Secondary | ICD-10-CM

## 2019-08-07 ENCOUNTER — Other Ambulatory Visit (INDEPENDENT_AMBULATORY_CARE_PROVIDER_SITE_OTHER): Payer: Self-pay | Admitting: Family Medicine

## 2019-08-07 DIAGNOSIS — E559 Vitamin D deficiency, unspecified: Secondary | ICD-10-CM

## 2019-08-09 ENCOUNTER — Other Ambulatory Visit: Payer: Self-pay | Admitting: Internal Medicine

## 2019-08-09 DIAGNOSIS — I10 Essential (primary) hypertension: Secondary | ICD-10-CM

## 2019-08-18 ENCOUNTER — Other Ambulatory Visit (INDEPENDENT_AMBULATORY_CARE_PROVIDER_SITE_OTHER): Payer: Managed Care, Other (non HMO)

## 2019-08-18 ENCOUNTER — Other Ambulatory Visit: Payer: Self-pay

## 2019-08-18 ENCOUNTER — Ambulatory Visit (INDEPENDENT_AMBULATORY_CARE_PROVIDER_SITE_OTHER): Payer: Managed Care, Other (non HMO)

## 2019-08-18 DIAGNOSIS — I10 Essential (primary) hypertension: Secondary | ICD-10-CM

## 2019-08-18 DIAGNOSIS — Z23 Encounter for immunization: Secondary | ICD-10-CM | POA: Diagnosis not present

## 2019-08-18 LAB — BASIC METABOLIC PANEL
BUN: 15 mg/dL (ref 6–23)
CO2: 31 mEq/L (ref 19–32)
Calcium: 9.3 mg/dL (ref 8.4–10.5)
Chloride: 104 mEq/L (ref 96–112)
Creatinine, Ser: 1 mg/dL (ref 0.40–1.20)
GFR: 55.72 mL/min — ABNORMAL LOW (ref >60.00)
Glucose, Bld: 91 mg/dL (ref 70–99)
Potassium: 3.9 mEq/L (ref 3.5–5.1)
Sodium: 140 mEq/L (ref 135–145)

## 2019-08-25 ENCOUNTER — Telehealth (INDEPENDENT_AMBULATORY_CARE_PROVIDER_SITE_OTHER): Payer: Managed Care, Other (non HMO) | Admitting: Family Medicine

## 2019-08-25 ENCOUNTER — Other Ambulatory Visit: Payer: Self-pay

## 2019-08-25 ENCOUNTER — Encounter (INDEPENDENT_AMBULATORY_CARE_PROVIDER_SITE_OTHER): Payer: Self-pay | Admitting: Family Medicine

## 2019-08-25 DIAGNOSIS — Z6841 Body Mass Index (BMI) 40.0 and over, adult: Secondary | ICD-10-CM | POA: Diagnosis not present

## 2019-08-25 DIAGNOSIS — E559 Vitamin D deficiency, unspecified: Secondary | ICD-10-CM | POA: Diagnosis not present

## 2019-08-25 DIAGNOSIS — F3289 Other specified depressive episodes: Secondary | ICD-10-CM

## 2019-08-25 MED ORDER — VITAMIN D (ERGOCALCIFEROL) 1.25 MG (50000 UNIT) PO CAPS: 50000.0000 [IU] | ORAL_CAPSULE | ORAL | 0 refills | Status: DC

## 2019-08-29 ENCOUNTER — Other Ambulatory Visit (INDEPENDENT_AMBULATORY_CARE_PROVIDER_SITE_OTHER): Payer: Self-pay | Admitting: Family Medicine

## 2019-08-29 DIAGNOSIS — E559 Vitamin D deficiency, unspecified: Secondary | ICD-10-CM

## 2019-08-30 NOTE — Progress Notes (Signed)
Office: [phone]  /  Fax: [phone] TeleHealth Visit:  [name] has verbally consented to this TeleHealth visit today. The patient is located at home, the provider is located at the [employer] [facility] office. The participants in this visit include the listed provider and patient. The visit was conducted today via [email or web address].  HPI:   Chief Complaint: OBESITY [name] is here to discuss her progress with her obesity treatment plan. She is on the Category 3 plan and is following her eating plan approximately 50 % of the time. She states she is exercising 0 minutes 0 times per week. [name] continues to do well on her plan. Her hunger is controlled and she is doing better with her meal planning. She is starting to do a little walking outdoors now that the weather has started to cool off.  We were unable to weigh the patient today for this TeleHealth visit. She feels as if she has lost weight since her last visit. She has lost 20 lbs since starting treatment with [place].  Vitamin D Deficiency [name] has a diagnosis of vitamin D deficiency. She is stable on prescription Vit D. Last labs were at goal. She denies nausea, vomiting or muscle weakness.  Depression with Emotional Eating Behaviors [name] stopped her Wellbutrin as she doesn't feel she needs it any longer. Her mood and energy is good. She is sleeping well. [name] struggles with emotional eating and using food for comfort to the extent that it is negatively impacting her health. She often snacks when she is not hungry. [name] sometimes feels she is out of control and then feels guilty that she made poor food choices. She has been working on behavior modification techniques to help reduce her emotional eating and has been somewhat successful. She shows no sign of suicidal or homicidal ideations.  Depression screen [facility] 2/9 05/26/2018 03/30/2018 03/03/2017  Decreased Interest 3 0 0  Down, Depressed, Hopeless 2 0 0  PHQ - 2 Score 5  0 0  Altered sleeping 1 - -  Tired, decreased energy 3 - -  Change in appetite 0 - -  Feeling bad or failure about yourself  0 - -  Trouble concentrating 0 - -  Moving slowly or fidgety/restless 0 - -  Suicidal thoughts 0 - -  PHQ-9 Score 9 - -  Difficult doing work/chores Somewhat difficult - -    ASSESSMENT AND PLAN:  Vitamin D deficiency - Plan: Vitamin D, Ergocalciferol, (DRISDOL) 1.25 MG (50000 UT) CAPS capsule  Other depression  Class 3 severe obesity with serious comorbidity and body mass index (BMI) of 40.0 to 44.9 in adult, unspecified obesity type (HCC)  PLAN:  Vitamin D Deficiency [name] was informed that low vitamin D levels contributes to fatigue and are associated with obesity, breast, and colon cancer. [name] agrees to continue taking prescription Vit D 50,000 IU every week #4 and we will refill for 1 month. She will follow up for routine testing of vitamin D, at least 2-3 times per year. She was informed of the risk of over-replacement of vitamin D and agrees to not increase her dose unless she discusses this with [place] first. [name] agrees to follow up with our clinic in 3 weeks.  Depression with Emotional Eating Behaviors We discussed behavior modification techniques today to help [name] deal with her emotional eating and depression. [name] agrees to discontinue Wellbutrin, and she agrees to follow up with our clinic in 3 weeks.  Obesity [name] is currently in  the action stage of change. As such, her goal is to continue with weight loss efforts She has agreed to follow the Category 3 plan [name] has been instructed to work up to a goal of 150 minutes of combined cardio and strengthening exercise per week for weight loss and overall health benefits. We discussed the following Behavioral Modification Strategies today: increase H20 intake and emotional eating strategies   [name] has agreed to follow up with our clinic in 3 weeks. She was informed of the  importance of frequent follow up visits to maximize her success with intensive lifestyle modifications for her multiple health conditions.  ALLERGIES: Allergies  Allergen Reactions  . Hydrochlorothiazide     REACTION: cramps  . Phentermine Hcl     REACTION: dizzy  . Valsartan     REACTION: side effect- lightheadedness  . Verapamil     REACTION: ? lightheaded    MEDICATIONS: Current Outpatient Medications on File Prior to Visit  Medication Sig Dispense Refill  . carvedilol (COREG) 25 MG tablet Take 1 tablet (25 mg total) by mouth 2 (two) times daily with a meal. 180 tablet 3  . fluticasone furoate-vilanterol (BREO ELLIPTA) 100-25 MCG/INH AEPB Inhale 1 puff into the lungs daily. 3 each 3  . furosemide (LASIX) 20 MG tablet Take 1 tablet (20 mg total) by mouth daily. 90 tablet 3  . loratadine (CLARITIN) 10 MG tablet Take 1 tablet (10 mg total) by mouth daily. 90 tablet 3  . triamcinolone cream (KENALOG) 0.5 % Apply 1 application topically 3 (three) times daily. 120 g 3   No current facility-administered medications on file prior to visit.     PAST MEDICAL HISTORY: Past Medical History:  Diagnosis Date  . Abnormal glucose 2009  . Allergic rhinitis   . Back pain   . COPD (chronic obstructive pulmonary disease) ([place])   . Dyspnea   . Food allergy   . GERD (gastroesophageal reflux disease)   . Headache    Migraines (rare)  . HTN (hypertension)   . Lactose intolerance   . Leg edema   . Osteoarthritis    Right Hip - SEVERE    PAST SURGICAL HISTORY: Past Surgical History:  Procedure Laterality Date  . CHOLECYSTECTOMY    . COLONOSCOPY    . hole repair in heart     2004 at [place]   . HYSTEROSCOPY W/D&C N/A 04/04/2016   Procedure: DILATATION AND CURETTAGE /HYSTEROSCOPY, POLYPECTOMY WITH MYOSURE;  Surgeon: Olga Millers, MD;  Location: [facility];  Service: Gynecology;  Laterality: N/A;  . KNEE ARTHROSCOPY     left   . TOTAL HIP ARTHROPLASTY Right 07/20/2013   Procedure: RIGHT  TOTAL HIP ARTHROPLASTY ANTERIOR APPROACH;  Surgeon: Mauri Pole, MD;  Location: [facility];  Service: Orthopedics;  Laterality: Right;  . TUBAL LIGATION    . WISDOM TOOTH EXTRACTION      SOCIAL HISTORY: Social History   Tobacco Use  . Smoking status: Former Smoker    Packs/day: 0.50    Years: 40.00    Pack years: 20.00    Types: Cigarettes    Quit date: 12/09/2011    Years since quitting: 7.7  . Smokeless tobacco: Never Used  . Tobacco comment: 2009  Substance Use Topics  . Alcohol use: Yes    Alcohol/week: 7.0 standard drinks    Types: 7 Glasses of wine per week    Comment: 1 glass of wine nightly  . Drug use: No    FAMILY HISTORY: Family  History  Problem Relation Age of Onset  . Parkinsonism Mother   . Hypertension Mother   . Hyperlipidemia Mother   . Hypertension Unknown     ROS: Review of Systems  Constitutional: Positive for weight loss.  Gastrointestinal: Negative for nausea and vomiting.  Musculoskeletal:       Negative muscle weakness  Psychiatric/Behavioral: Positive for depression. Negative for suicidal ideas.    PHYSICAL EXAM: Pt in no acute distress  RECENT LABS AND TESTS: BMET    Component Value Date/Time   NA 140 08/18/2019 1609   NA 142 09/17/2018 1114   K 3.9 08/18/2019 1609   CL 104 08/18/2019 1609   CO2 31 08/18/2019 1609   GLUCOSE 91 08/18/2019 1609   BUN 15 08/18/2019 1609   BUN 15 09/17/2018 1114   CREATININE 1.00 08/18/2019 1609   CALCIUM 9.3 08/18/2019 1609   GFRNONAA 47 (L) 09/17/2018 1114   GFRAA 54 (L) 09/17/2018 1114   Lab Results  Component Value Date   HGBA1C 6.0 06/28/2019   HGBA1C 6.0 12/28/2018   HGBA1C 5.8 (H) 09/17/2018   HGBA1C 6.0 (H) 05/26/2018   HGBA1C 5.9 08/28/2015   Lab Results  Component Value Date   INSULIN 14.2 09/17/2018   INSULIN 10.1 05/26/2018   CBC    Component Value Date/Time   WBC 6.9 06/28/2019 1647   RBC 4.19 06/28/2019 1647   HGB 12.6 06/28/2019 1647   HCT 38.5 06/28/2019 1647    PLT 223.0 06/28/2019 1647   MCV 91.8 06/28/2019 1647   MCH 30.3 03/26/2016 0905   MCHC 32.7 06/28/2019 1647   RDW 13.6 06/28/2019 1647   LYMPHSABS 1.9 06/28/2019 1647   MONOABS 0.8 06/28/2019 1647   EOSABS 0.3 06/28/2019 1647   BASOSABS 0.1 06/28/2019 1647   Iron/TIBC/Ferritin/ %Sat No results found for: IRON, TIBC, FERRITIN, IRONPCTSAT Lipid Panel     Component Value Date/Time   CHOL 183 12/28/2018 1645   CHOL 173 09/17/2018 1114   TRIG 144.0 12/28/2018 1645   HDL 38.80 (L) 12/28/2018 1645   HDL 40 09/17/2018 1114   CHOLHDL 5 12/28/2018 1645   VLDL 28.8 12/28/2018 1645   LDLCALC 116 (H) 12/28/2018 1645   LDLCALC 118 (H) 09/17/2018 1114   Hepatic Function Panel     Component Value Date/Time   PROT 7.2 06/28/2019 1647   PROT 6.9 09/17/2018 1114   ALBUMIN 4.1 06/28/2019 1647   ALBUMIN 4.3 09/17/2018 1114   AST 14 06/28/2019 1647   ALT 14 06/28/2019 1647   ALKPHOS 56 06/28/2019 1647   BILITOT 0.4 06/28/2019 1647   BILITOT 0.3 09/17/2018 1114   BILIDIR 0.0 06/28/2019 1647      Component Value Date/Time   TSH 0.86 06/28/2019 1647   TSH 1.36 12/28/2018 1645   TSH 0.96 03/30/2018 1642      I, Trixie Dredge, am acting as Location manager for Dennard Nip, MD I have reviewed the above documentation for accuracy and completeness, and I agree with the above. -Dennard Nip, MD

## 2019-09-02 ENCOUNTER — Other Ambulatory Visit: Payer: Managed Care, Other (non HMO)

## 2019-09-14 ENCOUNTER — Encounter (INDEPENDENT_AMBULATORY_CARE_PROVIDER_SITE_OTHER): Payer: Self-pay

## 2019-09-15 ENCOUNTER — Telehealth (INDEPENDENT_AMBULATORY_CARE_PROVIDER_SITE_OTHER): Payer: Managed Care, Other (non HMO) | Admitting: Family Medicine

## 2019-09-29 ENCOUNTER — Telehealth (INDEPENDENT_AMBULATORY_CARE_PROVIDER_SITE_OTHER): Payer: Managed Care, Other (non HMO) | Admitting: Family Medicine

## 2019-09-29 ENCOUNTER — Encounter (INDEPENDENT_AMBULATORY_CARE_PROVIDER_SITE_OTHER): Payer: Self-pay | Admitting: Family Medicine

## 2019-09-29 ENCOUNTER — Encounter (INDEPENDENT_AMBULATORY_CARE_PROVIDER_SITE_OTHER): Payer: Self-pay

## 2019-09-29 ENCOUNTER — Other Ambulatory Visit: Payer: Self-pay

## 2019-09-29 DIAGNOSIS — E559 Vitamin D deficiency, unspecified: Secondary | ICD-10-CM

## 2019-09-29 DIAGNOSIS — Z6841 Body Mass Index (BMI) 40.0 and over, adult: Secondary | ICD-10-CM

## 2019-09-29 MED ORDER — VITAMIN D (ERGOCALCIFEROL) 1.25 MG (50000 UNIT) PO CAPS: 50000.0000 [IU] | ORAL_CAPSULE | ORAL | 0 refills | Status: DC

## 2019-09-30 NOTE — Progress Notes (Signed)
Office: [phone]  /  Fax: [phone] TeleHealth Visit:  [name] has verbally consented to this TeleHealth visit today. The patient is located at home, the provider is located at the [employer] [facility] office. The participants in this visit include the listed provider and patient. The visit was conducted today via doxy.me.  HPI:   Chief Complaint: OBESITY [name] is here to discuss her progress with her obesity treatment plan. She is on the Category 3 plan and is following her eating plan approximately 50 % of the time. She states she is walking in place fast for 20-30 minutes 3 times per week. [name] continues to maintain her weight. She is not following her plan close enough to lose weight, but she is still being mindful.  We were unable to weigh the patient today for this TeleHealth visit. She feels as if she has maintained her weight since her last visit. She has lost 20 lbs since starting treatment with [place].  Vitamin D Deficiency [name] has a diagnosis of vitamin D deficiency. She is stable on prescription Vit D. Last level was at goal, but she is due for a recheck on labs. She denies nausea, vomiting or muscle weakness.  ASSESSMENT AND PLAN:  Vitamin D deficiency - Plan: Vitamin D, Ergocalciferol, (DRISDOL) 1.25 MG (50000 UT) CAPS capsule  Class 3 severe obesity with serious comorbidity and body mass index (BMI) of 40.0 to 44.9 in adult, unspecified obesity type ([place])  PLAN:  Vitamin D Deficiency [name] was informed that low vitamin D levels contributes to fatigue and are associated with obesity, breast, and colon cancer. [name] agrees to continue taking prescription Vit D 50,000 IU every week #4 and we will refill for 1 month. She will follow up for routine testing of vitamin D, at least 2-3 times per year. She was informed of the risk of over-replacement of vitamin D and agrees to not increase her dose unless she discusses this with [place] first. We will recheck  labs today. [name] agrees to follow up with our clinic in 2 to 3 weeks.  Obesity [name] is currently in the action stage of change. As such, her goal is to continue with weight loss efforts She has agreed to follow the Category 3 plan or follow the Pescatarian eating plan + 300 calories [name] has been instructed to work up to a goal of 150 minutes of combined cardio and strengthening exercise per week for weight loss and overall health benefits. We discussed the following Behavioral Modification Strategies today: increasing lean protein intake, decreasing simple carbohydrates  and work on meal planning and easy cooking plans   [name] has agreed to follow up with our clinic in 2 to 3 weeks. She was informed of the importance of frequent follow up visits to maximize her success with intensive lifestyle modifications for her multiple health conditions.  ALLERGIES: Allergies  Allergen Reactions  . Hydrochlorothiazide     REACTION: cramps  . Phentermine Hcl     REACTION: dizzy  . Valsartan     REACTION: side effect- lightheadedness  . Verapamil     REACTION: ? lightheaded    MEDICATIONS: Current Outpatient Medications on File Prior to Visit  Medication Sig Dispense Refill  . carvedilol (COREG) 25 MG tablet Take 1 tablet (25 mg total) by mouth 2 (two) times daily with a meal. 180 tablet 3  . fluticasone furoate-vilanterol (BREO ELLIPTA) 100-25 MCG/INH AEPB Inhale 1 puff into the lungs daily. 3 each 3  .  furosemide (LASIX) 20 MG tablet Take 1 tablet (20 mg total) by mouth daily. 90 tablet 3  . loratadine (CLARITIN) 10 MG tablet Take 1 tablet (10 mg total) by mouth daily. 90 tablet 3  . triamcinolone cream (KENALOG) 0.5 % Apply 1 application topically 3 (three) times daily. 120 g 3   No current facility-administered medications on file prior to visit.     PAST MEDICAL HISTORY: Past Medical History:  Diagnosis Date  . Abnormal glucose 2009  . Allergic rhinitis   . Back pain   .  COPD (chronic obstructive pulmonary disease) ([place])   . Dyspnea   . Food allergy   . GERD (gastroesophageal reflux disease)   . Headache    Migraines (rare)  . HTN (hypertension)   . Lactose intolerance   . Leg edema   . Osteoarthritis    Right Hip - SEVERE    PAST SURGICAL HISTORY: Past Surgical History:  Procedure Laterality Date  . CHOLECYSTECTOMY    . COLONOSCOPY    . hole repair in heart     2004 at [place]   . HYSTEROSCOPY W/D&C N/A 04/04/2016   Procedure: DILATATION AND CURETTAGE /HYSTEROSCOPY, POLYPECTOMY WITH MYOSURE;  Surgeon: Olga Millers, MD;  Location: [facility];  Service: Gynecology;  Laterality: N/A;  . KNEE ARTHROSCOPY     left   . TOTAL HIP ARTHROPLASTY Right 07/20/2013   Procedure: RIGHT TOTAL HIP ARTHROPLASTY ANTERIOR APPROACH;  Surgeon: Mauri Pole, MD;  Location: [facility];  Service: Orthopedics;  Laterality: Right;  . TUBAL LIGATION    . WISDOM TOOTH EXTRACTION      SOCIAL HISTORY: Social History   Tobacco Use  . Smoking status: Former Smoker    Packs/day: 0.50    Years: 40.00    Pack years: 20.00    Types: Cigarettes    Quit date: 12/09/2011    Years since quitting: 7.8  . Smokeless tobacco: Never Used  . Tobacco comment: 2009  Substance Use Topics  . Alcohol use: Yes    Alcohol/week: 7.0 standard drinks    Types: 7 Glasses of wine per week    Comment: 1 glass of wine nightly  . Drug use: No    FAMILY HISTORY: Family History  Problem Relation Age of Onset  . Parkinsonism Mother   . Hypertension Mother   . Hyperlipidemia Mother   . Hypertension Unknown     ROS: Review of Systems  Constitutional: Negative for weight loss.  Gastrointestinal: Negative for nausea and vomiting.  Musculoskeletal:       Negative muscle weakness    PHYSICAL EXAM: Pt in no acute distress  RECENT LABS AND TESTS: BMET    Component Value Date/Time   NA 140 08/18/2019 1609   NA 142 09/17/2018 1114   K 3.9 08/18/2019 1609   CL 104 08/18/2019 1609   CO2  31 08/18/2019 1609   GLUCOSE 91 08/18/2019 1609   BUN 15 08/18/2019 1609   BUN 15 09/17/2018 1114   CREATININE 1.00 08/18/2019 1609   CALCIUM 9.3 08/18/2019 1609   GFRNONAA 47 (L) 09/17/2018 1114   GFRAA 54 (L) 09/17/2018 1114   Lab Results  Component Value Date   HGBA1C 6.0 06/28/2019   HGBA1C 6.0 12/28/2018   HGBA1C 5.8 (H) 09/17/2018   HGBA1C 6.0 (H) 05/26/2018   HGBA1C 5.9 08/28/2015   Lab Results  Component Value Date   INSULIN 14.2 09/17/2018   INSULIN 10.1 05/26/2018   CBC    Component Value  Date/Time   WBC 6.9 06/28/2019 1647   RBC 4.19 06/28/2019 1647   HGB 12.6 06/28/2019 1647   HCT 38.5 06/28/2019 1647   PLT 223.0 06/28/2019 1647   MCV 91.8 06/28/2019 1647   MCH 30.3 03/26/2016 0905   MCHC 32.7 06/28/2019 1647   RDW 13.6 06/28/2019 1647   LYMPHSABS 1.9 06/28/2019 1647   MONOABS 0.8 06/28/2019 1647   EOSABS 0.3 06/28/2019 1647   BASOSABS 0.1 06/28/2019 1647   Iron/TIBC/Ferritin/ %Sat No results found for: IRON, TIBC, FERRITIN, IRONPCTSAT Lipid Panel     Component Value Date/Time   CHOL 183 12/28/2018 1645   CHOL 173 09/17/2018 1114   TRIG 144.0 12/28/2018 1645   HDL 38.80 (L) 12/28/2018 1645   HDL 40 09/17/2018 1114   CHOLHDL 5 12/28/2018 1645   VLDL 28.8 12/28/2018 1645   LDLCALC 116 (H) 12/28/2018 1645   LDLCALC 118 (H) 09/17/2018 1114   Hepatic Function Panel     Component Value Date/Time   PROT 7.2 06/28/2019 1647   PROT 6.9 09/17/2018 1114   ALBUMIN 4.1 06/28/2019 1647   ALBUMIN 4.3 09/17/2018 1114   AST 14 06/28/2019 1647   ALT 14 06/28/2019 1647   ALKPHOS 56 06/28/2019 1647   BILITOT 0.4 06/28/2019 1647   BILITOT 0.3 09/17/2018 1114   BILIDIR 0.0 06/28/2019 1647      Component Value Date/Time   TSH 0.86 06/28/2019 1647   TSH 1.36 12/28/2018 1645   TSH 0.96 03/30/2018 1642      I, Trixie Dredge, [name] acting as Location manager for Dennard Nip, MD I have reviewed the above documentation for accuracy and completeness, and I  agree with the above. -Dennard Nip, MD

## 2019-10-14 ENCOUNTER — Other Ambulatory Visit (INDEPENDENT_AMBULATORY_CARE_PROVIDER_SITE_OTHER): Payer: Self-pay

## 2019-10-14 DIAGNOSIS — E559 Vitamin D deficiency, unspecified: Secondary | ICD-10-CM

## 2019-10-14 DIAGNOSIS — E7849 Other hyperlipidemia: Secondary | ICD-10-CM

## 2019-10-14 DIAGNOSIS — R5383 Other fatigue: Secondary | ICD-10-CM

## 2019-10-14 DIAGNOSIS — R7303 Prediabetes: Secondary | ICD-10-CM

## 2019-10-14 DIAGNOSIS — R739 Hyperglycemia, unspecified: Secondary | ICD-10-CM

## 2019-10-15 LAB — COMPREHENSIVE METABOLIC PANEL
ALT: 23 IU/L (ref 0–32)
AST: 16 IU/L (ref 0–40)
Albumin/Globulin Ratio: 1.4 (ref 1.2–2.2)
Albumin: 4 g/dL (ref 3.8–4.8)
Alkaline Phosphatase: 67 IU/L (ref 39–117)
BUN/Creatinine Ratio: 15 (ref 12–28)
BUN: 13 mg/dL (ref 8–27)
Bilirubin Total: 0.3 mg/dL (ref 0.0–1.2)
CO2: 25 mmol/L (ref 20–29)
Calcium: 9.4 mg/dL (ref 8.7–10.3)
Chloride: 106 mmol/L (ref 96–106)
Creatinine, Ser: 0.87 mg/dL (ref 0.57–1.00)
GFR calc Af Amer: 81 mL/min/{1.73_m2} (ref >59)
GFR calc non Af Amer: 71 mL/min/{1.73_m2} (ref >59)
Globulin, Total: 2.8 g/dL (ref 1.5–4.5)
Glucose: 95 mg/dL (ref 65–99)
Potassium: 4.3 mmol/L (ref 3.5–5.2)
Sodium: 143 mmol/L (ref 134–144)
Total Protein: 6.8 g/dL (ref 6.0–8.5)

## 2019-10-15 LAB — HEMOGLOBIN A1C
Est. average glucose Bld gHb Est-mCnc: 120 mg/dL
Hgb A1c MFr Bld: 5.8 % — ABNORMAL HIGH (ref 4.8–5.6)

## 2019-10-15 LAB — LIPID PANEL WITH LDL/HDL RATIO
Cholesterol, Total: 185 mg/dL (ref 100–199)
HDL: 44 mg/dL (ref >39)
LDL Chol Calc (NIH): 122 mg/dL — ABNORMAL HIGH (ref 0–99)
LDL/HDL Ratio: 2.8 ratio (ref 0.0–3.2)
Triglycerides: 102 mg/dL (ref 0–149)
VLDL Cholesterol Cal: 19 mg/dL (ref 5–40)

## 2019-10-15 LAB — CK: Total CK: 113 U/L (ref 32–182)

## 2019-10-15 LAB — INSULIN, RANDOM: INSULIN: 26.1 u[IU]/mL — ABNORMAL HIGH (ref 2.6–24.9)

## 2019-10-15 LAB — VITAMIN D 25 HYDROXY (VIT D DEFICIENCY, FRACTURES): Vit D, 25-Hydroxy: 57 ng/mL (ref 30.0–100.0)

## 2019-10-15 LAB — MAGNESIUM: Magnesium: 1.9 mg/dL (ref 1.6–2.3)

## 2019-10-20 ENCOUNTER — Encounter (INDEPENDENT_AMBULATORY_CARE_PROVIDER_SITE_OTHER): Payer: Self-pay | Admitting: Family Medicine

## 2019-10-20 ENCOUNTER — Other Ambulatory Visit: Payer: Self-pay

## 2019-10-20 ENCOUNTER — Telehealth (INDEPENDENT_AMBULATORY_CARE_PROVIDER_SITE_OTHER): Payer: Managed Care, Other (non HMO) | Admitting: Family Medicine

## 2019-10-20 DIAGNOSIS — Z6841 Body Mass Index (BMI) 40.0 and over, adult: Secondary | ICD-10-CM | POA: Diagnosis not present

## 2019-10-20 DIAGNOSIS — E559 Vitamin D deficiency, unspecified: Secondary | ICD-10-CM | POA: Diagnosis not present

## 2019-10-20 DIAGNOSIS — I10 Essential (primary) hypertension: Secondary | ICD-10-CM

## 2019-10-20 MED ORDER — VITAMIN D (ERGOCALCIFEROL) 1.25 MG (50000 UNIT) PO CAPS: 50000.0000 [IU] | ORAL_CAPSULE | ORAL | 0 refills | Status: DC

## 2019-10-20 NOTE — Progress Notes (Signed)
Office: [phone]  /  Fax: [phone] TeleHealth Visit:  [name] has verbally consented to this TeleHealth visit today. The patient is located at home, the provider is located at the [facility] office. The participants in this visit include the listed provider and patient. The visit was conducted today via Doxy.  HPI:   Chief Complaint: OBESITY [name] is here to discuss her progress with her obesity treatment plan. She is on the Category 3 plan and is following her eating plan approximately 50% of the time. She states she is exercising 0 minutes 0 times per week. [name] continues to do well maintaining her weight. She is not following her plan closely, but is doing well with portion control and being mindful. Hunger is controlled. We were unable to weigh the patient today for this TeleHealth visit. She feels as if she has maintained her weight since her last visit. She has lost 20 lbs since starting treatment with [place].  Hypertension [name] is a 63 y.o. female with hypertension. Her blood pressure has been elevated on and off in the past and reports she doesn't check it at home. She was at the dentist last week and was told it was normal at that time. [name] denies chest pain and is on carvedilol. She is working weight loss to help control her blood pressure with the goal of decreasing her risk of heart attack and stroke.  Vitamin D deficiency [name] has a diagnosis of Vitamin D deficiency. Her Vitamin D results were discussed today and she is holding steady in the 50's. She is currently taking prescription Vit D and denies nausea, vomiting or muscle weakness.  ASSESSMENT AND PLAN:  Vitamin D deficiency - Plan: Vitamin D, Ergocalciferol, (DRISDOL) 1.25 MG (50000 UT) CAPS capsule  Essential hypertension  Class 3 severe obesity with serious comorbidity and body mass index (BMI) of 40.0 to 44.9 in adult, unspecified obesity type ([place])  PLAN:   Hypertension We discussed sodium restriction, working on healthy weight loss, and a regular exercise program as the means to achieve improved blood pressure control. Aakriti agreed with this plan and agreed to follow up as directed. We will continue to monitor her blood pressure as well as her progress with the above lifestyle modifications. She will continue her medications as prescribed and diet. She will watch for signs of hypotension as she continues her lifestyle modifications. We will recheck her in the office in 3 weeks.  Vitamin D Deficiency [name] was informed that low Vitamin D levels contributes to fatigue and are associated with obesity, breast, and colon cancer. She agrees to continue to take prescription Vit D @ 50,000 IU every week #4 with 0 refills and will follow-up for routine testing of Vitamin D, at least 2-3 times per year. She was informed of the risk of over-replacement of Vitamin D and agrees to not increase her dose unless she discusses this with [place] first. [name] agrees to follow-up with our clinic in 3 weeks.  Obesity [name] is currently in the action stage of change. As such, her goal is to continue with weight loss efforts. She has agreed to follow the Category 3 plan. [name] has been instructed to work up to a goal of 150 minutes of combined cardio and strengthening exercise per week for weight loss and overall health benefits. We discussed the following Behavioral Modification Strategies today: holiday eating strategies.  [name] has agreed to follow-up with our clinic in 3 weeks.  She was informed of the importance of frequent follow-up visits to maximize her success with intensive lifestyle modifications for her multiple health conditions.  ALLERGIES: Allergies  Allergen Reactions  . Hydrochlorothiazide     REACTION: cramps  . Phentermine Hcl     REACTION: dizzy  . Valsartan     REACTION: side effect- lightheadedness  . Verapamil     REACTION: ?  lightheaded    MEDICATIONS: Current Outpatient Medications on File Prior to Visit  Medication Sig Dispense Refill  . carvedilol (COREG) 25 MG tablet Take 1 tablet (25 mg total) by mouth 2 (two) times daily with a meal. 180 tablet 3  . fluticasone furoate-vilanterol (BREO ELLIPTA) 100-25 MCG/INH AEPB Inhale 1 puff into the lungs daily. 3 each 3  . furosemide (LASIX) 20 MG tablet Take 1 tablet (20 mg total) by mouth daily. 90 tablet 3  . loratadine (CLARITIN) 10 MG tablet Take 1 tablet (10 mg total) by mouth daily. 90 tablet 3  . triamcinolone cream (KENALOG) 0.5 % Apply 1 application topically 3 (three) times daily. 120 g 3   No current facility-administered medications on file prior to visit.     PAST MEDICAL HISTORY: Past Medical History:  Diagnosis Date  . Abnormal glucose 2009  . Allergic rhinitis   . Back pain   . COPD (chronic obstructive pulmonary disease) ([place])   . Dyspnea   . Food allergy   . GERD (gastroesophageal reflux disease)   . Headache    Migraines (rare)  . HTN (hypertension)   . Lactose intolerance   . Leg edema   . Osteoarthritis    Right Hip - SEVERE    PAST SURGICAL HISTORY: Past Surgical History:  Procedure Laterality Date  . CHOLECYSTECTOMY    . COLONOSCOPY    . hole repair in heart     2004 at [facility]   . HYSTEROSCOPY W/D&C N/A 04/04/2016   Procedure: DILATATION AND CURETTAGE /HYSTEROSCOPY, POLYPECTOMY WITH MYOSURE;  Surgeon: Olga Millers, MD;  Location: [facility];  Service: Gynecology;  Laterality: N/A;  . KNEE ARTHROSCOPY     left   . TOTAL HIP ARTHROPLASTY Right 07/20/2013   Procedure: RIGHT TOTAL HIP ARTHROPLASTY ANTERIOR APPROACH;  Surgeon: Mauri Pole, MD;  Location: [facility];  Service: Orthopedics;  Laterality: Right;  . TUBAL LIGATION    . WISDOM TOOTH EXTRACTION      SOCIAL HISTORY: Social History   Tobacco Use  . Smoking status: Former Smoker    Packs/day: 0.50    Years: 40.00    Pack years: 20.00    Types: Cigarettes    Quit  date: 12/09/2011    Years since quitting: 7.8  . Smokeless tobacco: Never Used  . Tobacco comment: 2009  Substance Use Topics  . Alcohol use: Yes    Alcohol/week: 7.0 standard drinks    Types: 7 Glasses of wine per week    Comment: 1 glass of wine nightly  . Drug use: No    FAMILY HISTORY: Family History  Problem Relation Age of Onset  . Parkinsonism Mother   . Hypertension Mother   . Hyperlipidemia Mother   . Hypertension Unknown    ROS: Review of Systems  Cardiovascular: Negative for chest pain.  Gastrointestinal: Negative for nausea and vomiting.  Musculoskeletal:       Negative for muscle weakness.   PHYSICAL EXAM: Pt in no acute distress  RECENT LABS AND TESTS: BMET    Component Value Date/Time   NA 143  10/14/2019 1055   K 4.3 10/14/2019 1055   CL 106 10/14/2019 1055   CO2 25 10/14/2019 1055   GLUCOSE 95 10/14/2019 1055   GLUCOSE 91 08/18/2019 1609   BUN 13 10/14/2019 1055   CREATININE 0.87 10/14/2019 1055   CALCIUM 9.4 10/14/2019 1055   GFRNONAA 71 10/14/2019 1055   GFRAA 81 10/14/2019 1055   Lab Results  Component Value Date   HGBA1C 5.8 (H) 10/14/2019   HGBA1C 6.0 06/28/2019   HGBA1C 6.0 12/28/2018   HGBA1C 5.8 (H) 09/17/2018   HGBA1C 6.0 (H) 05/26/2018   Lab Results  Component Value Date   INSULIN 26.1 (H) 10/14/2019   INSULIN 14.2 09/17/2018   INSULIN 10.1 05/26/2018   CBC    Component Value Date/Time   WBC 6.9 06/28/2019 1647   RBC 4.19 06/28/2019 1647   HGB 12.6 06/28/2019 1647   HCT 38.5 06/28/2019 1647   PLT 223.0 06/28/2019 1647   MCV 91.8 06/28/2019 1647   MCH 30.3 03/26/2016 0905   MCHC 32.7 06/28/2019 1647   RDW 13.6 06/28/2019 1647   LYMPHSABS 1.9 06/28/2019 1647   MONOABS 0.8 06/28/2019 1647   EOSABS 0.3 06/28/2019 1647   BASOSABS 0.1 06/28/2019 1647   Iron/TIBC/Ferritin/ %Sat No results found for: IRON, TIBC, FERRITIN, IRONPCTSAT Lipid Panel     Component Value Date/Time   CHOL 185 10/14/2019 1055   TRIG 102  10/14/2019 1055   HDL 44 10/14/2019 1055   CHOLHDL 5 12/28/2018 1645   VLDL 28.8 12/28/2018 1645   LDLCALC 122 (H) 10/14/2019 1055   Hepatic Function Panel     Component Value Date/Time   PROT 6.8 10/14/2019 1055   ALBUMIN 4.0 10/14/2019 1055   AST 16 10/14/2019 1055   ALT 23 10/14/2019 1055   ALKPHOS 67 10/14/2019 1055   BILITOT 0.3 10/14/2019 1055   BILIDIR 0.0 06/28/2019 1647      Component Value Date/Time   TSH 0.86 06/28/2019 1647   TSH 1.36 12/28/2018 1645   TSH 0.96 03/30/2018 1642   Results for SAMMIJO, [employer]AL (MRN [id number]) as of 10/20/2019 10:32  Ref. Range 10/14/2019 10:55  Vitamin D, 25-Hydroxy Latest Ref Range: 30.0 - 100.0 ng/mL 57.0   I, Michaelene Song, am acting as Location manager for Dennard Nip, MD I have reviewed the above documentation for accuracy and completeness, and I agree with the above. -Dennard Nip, MD

## 2019-11-08 ENCOUNTER — Ambulatory Visit (INDEPENDENT_AMBULATORY_CARE_PROVIDER_SITE_OTHER): Payer: Managed Care, Other (non HMO) | Admitting: Family Medicine

## 2019-11-29 ENCOUNTER — Other Ambulatory Visit (INDEPENDENT_AMBULATORY_CARE_PROVIDER_SITE_OTHER): Payer: Self-pay | Admitting: Family Medicine

## 2019-11-29 DIAGNOSIS — E559 Vitamin D deficiency, unspecified: Secondary | ICD-10-CM

## 2019-12-29 ENCOUNTER — Other Ambulatory Visit: Payer: Self-pay

## 2019-12-29 ENCOUNTER — Other Ambulatory Visit: Payer: Self-pay | Admitting: Internal Medicine

## 2019-12-29 ENCOUNTER — Encounter: Payer: Self-pay | Admitting: Internal Medicine

## 2019-12-29 ENCOUNTER — Ambulatory Visit (INDEPENDENT_AMBULATORY_CARE_PROVIDER_SITE_OTHER): Payer: Managed Care, Other (non HMO) | Admitting: Internal Medicine

## 2019-12-29 ENCOUNTER — Other Ambulatory Visit (INDEPENDENT_AMBULATORY_CARE_PROVIDER_SITE_OTHER): Payer: Managed Care, Other (non HMO)

## 2019-12-29 VITALS — BP 142/84 | HR 67 | Temp 98.5°F | Ht 64.0 in | Wt 261.0 lb

## 2019-12-29 DIAGNOSIS — Z0000 Encounter for general adult medical examination without abnormal findings: Secondary | ICD-10-CM

## 2019-12-29 DIAGNOSIS — N183 Chronic kidney disease, stage 3 unspecified: Secondary | ICD-10-CM

## 2019-12-29 DIAGNOSIS — R739 Hyperglycemia, unspecified: Secondary | ICD-10-CM | POA: Diagnosis not present

## 2019-12-29 DIAGNOSIS — J4520 Mild intermittent asthma, uncomplicated: Secondary | ICD-10-CM | POA: Diagnosis not present

## 2019-12-29 DIAGNOSIS — E559 Vitamin D deficiency, unspecified: Secondary | ICD-10-CM | POA: Diagnosis not present

## 2019-12-29 DIAGNOSIS — I10 Essential (primary) hypertension: Secondary | ICD-10-CM

## 2019-12-29 LAB — CBC WITH DIFFERENTIAL/PLATELET
Basophils Absolute: 0 10*3/uL (ref 0.0–0.1)
Basophils Relative: 0.2 % (ref 0.0–3.0)
Eosinophils Absolute: 0.3 10*3/uL (ref 0.0–0.7)
Eosinophils Relative: 3.5 % (ref 0.0–5.0)
HCT: 38 % (ref 36.0–46.0)
Hemoglobin: 12.5 g/dL (ref 12.0–15.0)
Lymphocytes Relative: 27.1 % (ref 12.0–46.0)
Lymphs Abs: 2.3 10*3/uL (ref 0.7–4.0)
MCHC: 32.9 g/dL (ref 30.0–36.0)
MCV: 91.1 fl (ref 78.0–100.0)
Monocytes Absolute: 0.9 10*3/uL (ref 0.1–1.0)
Monocytes Relative: 10.2 % (ref 3.0–12.0)
Neutro Abs: 5 10*3/uL (ref 1.4–7.7)
Neutrophils Relative %: 59 % (ref 43.0–77.0)
Platelets: 221 10*3/uL (ref 150.0–400.0)
RBC: 4.18 Mil/uL (ref 3.87–5.11)
RDW: 13.4 % (ref 11.5–15.5)
WBC: 8.5 10*3/uL (ref 4.0–10.5)

## 2019-12-29 MED ORDER — FUROSEMIDE 20 MG PO TABS: 20.0000 mg | ORAL_TABLET | Freq: Every day | ORAL | 3 refills | Status: DC

## 2019-12-29 MED ORDER — LORATADINE 10 MG PO TABS: 10.0000 mg | ORAL_TABLET | Freq: Every day | ORAL | 3 refills | Status: DC

## 2019-12-29 MED ORDER — DULERA 200-5 MCG/ACT IN AERO: 2.0000 | INHALATION_SPRAY | Freq: Two times a day (BID) | RESPIRATORY_TRACT | 11 refills | Status: DC

## 2019-12-29 MED ORDER — VITAMIN D (ERGOCALCIFEROL) 1.25 MG (50000 UNIT) PO CAPS: 50000.0000 [IU] | ORAL_CAPSULE | ORAL | 3 refills | Status: DC

## 2019-12-29 MED ORDER — CARVEDILOL 25 MG PO TABS: 25.0000 mg | ORAL_TABLET | Freq: Two times a day (BID) | ORAL | 3 refills | Status: DC

## 2019-12-29 MED ORDER — TRIAMCINOLONE ACETONIDE 0.5 % EX CREA: 1.0000 "application " | TOPICAL_CREAM | Freq: Three times a day (TID) | CUTANEOUS | 3 refills | Status: DC

## 2019-12-29 NOTE — Assessment & Plan Note (Addendum)
Singulair, [name]

## 2019-12-29 NOTE — Progress Notes (Signed)
Subjective:  Patient ID: [name], female    DOB: Nov 04, 1956  Age: 64 y.o. MRN: [id number]  CC: No chief complaint on file.   HPI [name] presents for a hypertension, asthma, vitamin D deficiency follow-up  Outpatient Medications Prior to Visit  Medication Sig Dispense Refill  . BREO ELLIPTA 100-25 MCG/INH AEPB INHALE 1 PUFF BY MOUTH EVERY DAY 60 each 11  . carvedilol (COREG) 25 MG tablet Take 1 tablet (25 mg total) by mouth 2 (two) times daily with a meal. 180 tablet 3  . furosemide (LASIX) 20 MG tablet Take 1 tablet (20 mg total) by mouth daily. 90 tablet 3  . loratadine (CLARITIN) 10 MG tablet Take 1 tablet (10 mg total) by mouth daily. 90 tablet 3  . triamcinolone cream (KENALOG) 0.5 % Apply 1 application topically 3 (three) times daily. 120 g 3  . Vitamin D, Ergocalciferol, (DRISDOL) 1.25 MG (50000 UT) CAPS capsule Take 1 capsule (50,000 Units total) by mouth every 7 (seven) days. 4 capsule 0   No facility-administered medications prior to visit.    ROS: Review of Systems  Constitutional: Positive for unexpected weight change. Negative for activity change, appetite change, chills and fatigue.  HENT: Negative for congestion, mouth sores and sinus pressure.   Eyes: Negative for visual disturbance.  Respiratory: Negative for cough and chest tightness.   Gastrointestinal: Negative for abdominal pain and nausea.  Genitourinary: Negative for difficulty urinating, frequency and vaginal pain.  Musculoskeletal: Negative for back pain and gait problem.  Skin: Negative for pallor and rash.  Neurological: Negative for dizziness, tremors, weakness, numbness and headaches.  Psychiatric/Behavioral: Negative for confusion, sleep disturbance and suicidal ideas.    Objective:  BP (!) 142/84 (BP Location: Left Arm, Patient Position: Sitting, Cuff Size: Large)   Pulse 67   Temp 98.5 F (36.9 C) (Oral)   Ht 5\' 4"  (1.626 m)   Wt 261 lb (118.4 kg)   SpO2 97%   BMI 44.80 kg/m     BP Readings from Last 3 Encounters:  12/29/19 (!) 142/84  06/28/19 (!) 158/80  02/09/19 128/79    Wt Readings from Last 3 Encounters:  12/29/19 261 lb (118.4 kg)  06/28/19 256 lb (116.1 kg)  02/09/19 246 lb (111.6 kg)    Physical Exam Constitutional:      General: She is not in acute distress.    Appearance: She is well-developed. She is obese.  HENT:     Head: Normocephalic.     Right Ear: External ear normal.     Left Ear: External ear normal.     Nose: Nose normal.  Eyes:     General:        Right eye: No discharge.        Left eye: No discharge.     Conjunctiva/sclera: Conjunctivae normal.     Pupils: Pupils are equal, round, and reactive to light.  Neck:     Thyroid: No thyromegaly.     Vascular: No JVD.     Trachea: No tracheal deviation.  Cardiovascular:     Rate and Rhythm: Normal rate and regular rhythm.     Heart sounds: Normal heart sounds.  Pulmonary:     Effort: No respiratory distress.     Breath sounds: No stridor. No wheezing.  Abdominal:     General: Bowel sounds are normal. There is no distension.     Palpations: Abdomen is soft. There is no mass.     Tenderness: There  is no abdominal tenderness. There is no guarding or rebound.  Musculoskeletal:        General: No tenderness.     Cervical back: Normal range of motion and neck supple.  Lymphadenopathy:     Cervical: No cervical adenopathy.  Skin:    Findings: No erythema or rash.  Neurological:     Cranial Nerves: No cranial nerve deficit.     Motor: No abnormal muscle tone.     Coordination: Coordination normal.     Deep Tendon Reflexes: Reflexes normal.  Psychiatric:        Behavior: Behavior normal.        Thought Content: Thought content normal.        Judgment: Judgment normal.     Lab Results  Component Value Date   WBC 6.9 06/28/2019   HGB 12.6 06/28/2019   HCT 38.5 06/28/2019   PLT 223.0 06/28/2019   GLUCOSE 95 10/14/2019   CHOL 185 10/14/2019   TRIG 102 10/14/2019    HDL 44 10/14/2019   LDLCALC 122 (H) 10/14/2019   ALT 23 10/14/2019   AST 16 10/14/2019   NA 143 10/14/2019   K 4.3 10/14/2019   CL 106 10/14/2019   CREATININE 0.87 10/14/2019   BUN 13 10/14/2019   CO2 25 10/14/2019   TSH 0.86 06/28/2019   INR 1.09 07/13/2013   HGBA1C 5.8 (H) 10/14/2019    US Renal  Result Date: 08/05/2019 CLINICAL DATA:  Chronic kidney disease stage 3, decreased GFR EXAM: RENAL / URINARY TRACT ULTRASOUND COMPLETE COMPARISON:  None. FINDINGS: Right Kidney: Renal measurements: 11 x 4.5 x 6.5 cm = volume: 171 mL. Normal echogenicity and cortical thickness. No hydronephrosis or acute finding. Small cortical simple and complex renal cysts noted largest measures 1.5 cm in the upper pole. No acute finding. Left Kidney: Renal measurements: 11.2 x 5.7 x 7.3 cm = volume: 243 mL. Lower pole exophytic anechoic cyst measures 5.5 cm. Bladder: Normal appearance by ultrasound. Nonspecific hypoechoic rounded soft tissue at the bladder base, measuring up to 2.2 cm, close to the vaginal canal or cervical region. This is not fully characterized by ultrasound. Consider further evaluation with CT if warranted. IMPRESSION: No acute finding by renal ultrasound. No hydronephrosis. Scattered small renal cysts some are complex. Nonspecific soft tissue prominence along the bladder base, not fully characterized by ultrasound. See above comment. Electronically Signed   By: Jerilynn Mages.  Shick M.D.   On: 08/05/2019 08:38    Assessment & Plan:   There are no diagnoses linked to this encounter.   No orders of the defined types were placed in this encounter.    Follow-up: No follow-ups on file.  Walker Kehr, MD

## 2019-12-29 NOTE — Assessment & Plan Note (Signed)
We discussed age appropriate health related issues, including available/recomended screening tests and vaccinations. We discussed a need for adhering to healthy diet and exercise. Labs were ordered to be later reviewed . All questions were answered.   

## 2019-12-29 NOTE — Assessment & Plan Note (Signed)
Labs

## 2019-12-29 NOTE — Assessment & Plan Note (Signed)
Vit D 

## 2019-12-29 NOTE — Patient Instructions (Signed)

## 2019-12-30 LAB — HEPATIC FUNCTION PANEL
ALT: 17 U/L (ref 0–35)
AST: 17 U/L (ref 0–37)
Albumin: 4.2 g/dL (ref 3.5–5.2)
Alkaline Phosphatase: 60 U/L (ref 39–117)
Bilirubin, Direct: 0 mg/dL (ref 0.0–0.3)
Total Bilirubin: 0.3 mg/dL (ref 0.2–1.2)
Total Protein: 7.5 g/dL (ref 6.0–8.3)

## 2019-12-30 LAB — BASIC METABOLIC PANEL
BUN: 15 mg/dL (ref 6–23)
CO2: 30 mEq/L (ref 19–32)
Calcium: 9.9 mg/dL (ref 8.4–10.5)
Chloride: 104 mEq/L (ref 96–112)
Creatinine, Ser: 0.94 mg/dL (ref 0.40–1.20)
GFR: 59.78 mL/min — ABNORMAL LOW (ref >60.00)
Glucose, Bld: 89 mg/dL (ref 70–99)
Potassium: 3.9 mEq/L (ref 3.5–5.1)
Sodium: 141 mEq/L (ref 135–145)

## 2019-12-30 LAB — URINALYSIS
Bilirubin Urine: NEGATIVE
Hgb urine dipstick: NEGATIVE
Ketones, ur: NEGATIVE
Leukocytes,Ua: NEGATIVE
Nitrite: NEGATIVE
Specific Gravity, Urine: 1.02 (ref 1.000–1.030)
Total Protein, Urine: NEGATIVE
Urine Glucose: NEGATIVE
Urobilinogen, UA: 0.2 (ref 0.0–1.0)
pH: 5 (ref 5.0–8.0)

## 2019-12-30 LAB — LIPID PANEL
Cholesterol: 182 mg/dL (ref 0–200)
HDL: 39.5 mg/dL (ref >39.00)
LDL Cholesterol: 112 mg/dL — ABNORMAL HIGH (ref 0–99)
NonHDL: 142.37
Total CHOL/HDL Ratio: 5
Triglycerides: 152 mg/dL — ABNORMAL HIGH (ref 0.0–149.0)
VLDL: 30.4 mg/dL (ref 0.0–40.0)

## 2019-12-30 LAB — VITAMIN B12: Vitamin B-12: 555 pg/mL (ref 211–911)

## 2019-12-30 LAB — TSH: TSH: 0.69 u[IU]/mL (ref 0.35–4.50)

## 2019-12-30 LAB — VITAMIN D 25 HYDROXY (VIT D DEFICIENCY, FRACTURES): VITD: 57.22 ng/mL (ref 30.00–100.00)

## 2019-12-30 LAB — HEMOGLOBIN A1C: Hgb A1c MFr Bld: 6.1 % (ref 4.6–6.5)

## 2020-01-02 ENCOUNTER — Encounter: Payer: Self-pay | Admitting: Internal Medicine

## 2020-01-02 NOTE — Assessment & Plan Note (Signed)
Continue with Coreg and furosemide

## 2020-01-07 ENCOUNTER — Other Ambulatory Visit: Payer: Self-pay | Admitting: Internal Medicine

## 2020-02-10 ENCOUNTER — Ambulatory Visit: Payer: Managed Care, Other (non HMO) | Attending: Internal Medicine

## 2020-02-10 DIAGNOSIS — Z23 Encounter for immunization: Secondary | ICD-10-CM | POA: Insufficient documentation

## 2020-02-10 NOTE — Progress Notes (Signed)
   Covid-19 Vaccination Clinic  Name:  [name]    MRN: [id number] DOB: 08/12/1956  02/10/2020  [name] was observed post Covid-19 immunization for 15 minutes without incident. She was provided with Vaccine Information Sheet and instruction to access the V-Safe system.   [name] was instructed to call 911 with any severe reactions post vaccine: Marland Kitchen Difficulty breathing  . Swelling of face and throat  . A fast heartbeat  . A bad rash all over body  . Dizziness and weakness   Immunizations Administered    Name Date Dose VIS Date Route   Pfizer COVID-19 Vaccine 02/10/2020  3:17 PM 0.3 mL 11/19/2019 Intramuscular   Manufacturer: [place]   Lot: [id number]   [place]: [id number]

## 2020-03-08 ENCOUNTER — Ambulatory Visit: Payer: Managed Care, Other (non HMO) | Attending: Internal Medicine

## 2020-03-08 DIAGNOSIS — Z23 Encounter for immunization: Secondary | ICD-10-CM

## 2020-03-08 NOTE — Progress Notes (Signed)
   Covid-19 Vaccination Clinic  Name:  NEVAEHA SCHODOWSKI    MRN: [id number] DOB: May 31, 1956  03/08/2020  Ms. Stockwell was observed post Covid-19 immunization for 15 minutes without incident. She was provided with Vaccine Information Sheet and instruction to access the V-Safe system.   Ms. Wemple was instructed to call 911 with any severe reactions post vaccine: Marland Kitchen Difficulty breathing  . Swelling of face and throat  . A fast heartbeat  . A bad rash all over body  . Dizziness and weakness   Immunizations Administered    Name Date Dose VIS Date Route   Pfizer COVID-19 Vaccine 03/08/2020  4:12 PM 0.3 mL 11/19/2019 Intramuscular   Manufacturer: [place]   Lot: [id number]   [place]: [id number]

## 2020-04-21 ENCOUNTER — Telehealth: Payer: Self-pay | Admitting: Internal Medicine

## 2020-04-21 NOTE — Telephone Encounter (Signed)
[name] called from the [facility], they are looking for a medical clearance form requesting notes and labs that was faxed over on May 6th and 7th. We were unable to locate the form, or get in touch with the assistant, so [name] was asked to fax the form again.

## 2020-04-24 NOTE — Telephone Encounter (Signed)
faxed

## 2020-04-27 NOTE — Telephone Encounter (Signed)
New message:   [name] called from the [facility], they are looking for a medical clearance form requesting notes and labs that was faxed over on May 6th, 7th, and 18th. She states if we can also attach the pt's last office notes as well as her most current labs. Please advise.

## 2020-04-28 NOTE — Telephone Encounter (Signed)
    [facility] calling to request last office visit notes and labs Phone [phone]

## 2020-05-01 NOTE — Telephone Encounter (Signed)
12/29/19 OV note and labs faxed to number below.

## 2020-12-11 ENCOUNTER — Other Ambulatory Visit: Payer: Self-pay | Admitting: Internal Medicine

## 2021-01-05 ENCOUNTER — Other Ambulatory Visit: Payer: Self-pay | Admitting: Internal Medicine

## 2021-01-28 ENCOUNTER — Other Ambulatory Visit: Payer: Self-pay | Admitting: Internal Medicine

## 2021-02-08 ENCOUNTER — Other Ambulatory Visit: Payer: Self-pay | Admitting: Internal Medicine

## 2021-03-06 ENCOUNTER — Other Ambulatory Visit: Payer: Self-pay | Admitting: Internal Medicine

## 2021-03-21 DIAGNOSIS — H6123 Impacted cerumen, bilateral: Secondary | ICD-10-CM | POA: Diagnosis not present

## 2021-04-06 ENCOUNTER — Other Ambulatory Visit: Payer: Self-pay | Admitting: Internal Medicine

## 2021-07-15 ENCOUNTER — Other Ambulatory Visit: Payer: Self-pay | Admitting: Internal Medicine

## 2021-11-21 ENCOUNTER — Telehealth: Payer: Self-pay | Admitting: Internal Medicine

## 2021-11-21 NOTE — Telephone Encounter (Signed)
1.Medication Requested: furosemide (LASIX) 20 MG tablet loratadine (CLARITIN) 10 MG tablet carvedilol (COREG) 25 MG tablet mometasone-formoterol (DULERA) 200-5 MCG/ACT AERO triamcinolone cream (KENALOG) 0.5 % Vitamin D, Ergocalciferol, (DRISDOL) 1.25 MG (50000 UNIT) CAPS capsule  2. Pharmacy (Name, Street, [facility]): [facility], [place] - [address] Phone:  [phone]  Fax:  [phone]     3. On Med List: y  57. Last Visit with PCP:  5. Next visit date with PCP:  Requesting short refill on all medications, until next available appt.   Agent: Please be advised that RX refills may take up to 3 business days. We ask that you follow-up with your pharmacy.

## 2021-11-22 MED ORDER — DULERA 200-5 MCG/ACT IN AERO: 2.0000 | INHALATION_SPRAY | Freq: Two times a day (BID) | RESPIRATORY_TRACT | 0 refills | Status: DC

## 2021-11-22 MED ORDER — TRIAMCINOLONE ACETONIDE 0.5 % EX CREA: 1.0000 "application " | TOPICAL_CREAM | Freq: Three times a day (TID) | CUTANEOUS | 0 refills | Status: DC

## 2021-11-22 MED ORDER — FUROSEMIDE 20 MG PO TABS: ORAL_TABLET | ORAL | 0 refills | Status: DC

## 2021-11-22 MED ORDER — LORATADINE 10 MG PO TABS: 10.0000 mg | ORAL_TABLET | Freq: Every day | ORAL | 0 refills | Status: DC

## 2021-11-22 MED ORDER — MONTELUKAST SODIUM 10 MG PO TABS: 10.0000 mg | ORAL_TABLET | Freq: Every day | ORAL | 0 refills | Status: DC

## 2021-11-22 NOTE — Telephone Encounter (Signed)
Per office policy sent 30 day to local pharmacy until appt.../lmb  

## 2021-12-04 ENCOUNTER — Other Ambulatory Visit: Payer: Self-pay

## 2021-12-04 ENCOUNTER — Ambulatory Visit: Payer: BC Managed Care – PPO | Admitting: Internal Medicine

## 2021-12-04 VITALS — BP 150/84 | HR 77 | Temp 98.2°F | Ht 64.0 in | Wt 278.6 lb

## 2021-12-04 DIAGNOSIS — E559 Vitamin D deficiency, unspecified: Secondary | ICD-10-CM

## 2021-12-04 DIAGNOSIS — I10 Essential (primary) hypertension: Secondary | ICD-10-CM

## 2021-12-04 DIAGNOSIS — R739 Hyperglycemia, unspecified: Secondary | ICD-10-CM

## 2021-12-04 DIAGNOSIS — Z6841 Body Mass Index (BMI) 40.0 and over, adult: Secondary | ICD-10-CM

## 2021-12-04 DIAGNOSIS — Z0000 Encounter for general adult medical examination without abnormal findings: Secondary | ICD-10-CM

## 2021-12-04 MED ORDER — MONTELUKAST SODIUM 10 MG PO TABS: 10.0000 mg | ORAL_TABLET | Freq: Every day | ORAL | 3 refills | Status: DC

## 2021-12-04 MED ORDER — DULERA 200-5 MCG/ACT IN AERO: 3.0000 | INHALATION_SPRAY | Freq: Two times a day (BID) | RESPIRATORY_TRACT | 3 refills | Status: DC

## 2021-12-04 MED ORDER — FUROSEMIDE 20 MG PO TABS: ORAL_TABLET | ORAL | 3 refills | Status: DC

## 2021-12-04 MED ORDER — CARVEDILOL 25 MG PO TABS
25.0000 mg | ORAL_TABLET | Freq: Two times a day (BID) | ORAL | 3 refills | Status: DC
Start: 2021-12-04 — End: 2022-02-25

## 2021-12-04 NOTE — Progress Notes (Signed)
Subjective:  Patient ID: [name], female    DOB: 1956/10/27  Age: 65 y.o. MRN: [id number]  CC: Medication Refill   HPI [name] presents for LBP C/o wt gain, HTN, DM C/o grief. Mom died in 04-26-21.  Outpatient Medications Prior to Visit  Medication Sig Dispense Refill   loratadine (CLARITIN) 10 MG tablet Take 1 tablet (10 mg total) by mouth daily. Must keep 11/28/21 appt for future refills 30 tablet 0   triamcinolone cream (KENALOG) 0.5 % Apply 1 application topically 3 (three) times daily. Must keep 11/28/21 appt for future refills 120 g 0   Vitamin D, Ergocalciferol, (DRISDOL) 1.25 MG (50000 UNIT) CAPS capsule Take 1 capsule (50,000 Units total) by mouth every 7 (seven) days. 12 capsule 3   carvedilol (COREG) 25 MG tablet Take 1 tablet (25 mg total) by mouth 2 (two) times daily with a meal. 180 tablet 3   furosemide (LASIX) 20 MG tablet TAKE 1 TABLET BY MOUTH EVERY DAY *Must keep 11/28/21 appt for future refills* 30 tablet 0   mometasone-formoterol (DULERA) 200-5 MCG/ACT AERO Inhale 2 puffs into the lungs 2 (two) times daily. Must keep 11/28/21 appt for future refills 13 g 0   montelukast (SINGULAIR) 10 MG tablet Take 1 tablet (10 mg total) by mouth daily. Must keep 11/28/21 appt for future refills 30 tablet 0   No facility-administered medications prior to visit.    ROS: Review of Systems  Constitutional:  Positive for fatigue and unexpected weight change. Negative for activity change, appetite change and chills.  HENT:  Negative for congestion, mouth sores and sinus pressure.   Eyes:  Negative for visual disturbance.  Respiratory:  Negative for cough and chest tightness.   Gastrointestinal:  Negative for abdominal pain and nausea.  Genitourinary:  Negative for difficulty urinating, frequency and vaginal pain.  Musculoskeletal:  Positive for back pain. Negative for gait problem.  Skin:  Negative for pallor and rash.  Neurological:  Negative for dizziness, tremors,  weakness, numbness and headaches.  Psychiatric/Behavioral:  Negative for confusion and sleep disturbance.    Objective:  BP (!) 150/84 (BP Location: Left Arm, Patient Position: Sitting, Cuff Size: Large)    Pulse 77    Temp 98.2 F (36.8 C) (Oral)    Ht 5\' 4"  (1.626 m)    Wt 278 lb 9.6 oz (126.4 kg)    SpO2 96%    BMI 47.82 kg/m   BP Readings from Last 3 Encounters:  12/04/21 (!) 150/84  12/29/19 (!) 142/84  06/28/19 (!) 158/80    Wt Readings from Last 3 Encounters:  12/04/21 278 lb 9.6 oz (126.4 kg)  12/29/19 261 lb (118.4 kg)  06/28/19 256 lb (116.1 kg)    Physical Exam Constitutional:      General: She is not in acute distress.    Appearance: She is well-developed.  HENT:     Head: Normocephalic.     Right Ear: External ear normal.     Left Ear: External ear normal.     Nose: Nose normal.  Eyes:     General:        Right eye: No discharge.        Left eye: No discharge.     Conjunctiva/sclera: Conjunctivae normal.     Pupils: Pupils are equal, round, and reactive to light.  Neck:     Thyroid: No thyromegaly.     Vascular: No JVD.     Trachea: No tracheal deviation.  Cardiovascular:     Rate and Rhythm: Normal rate and regular rhythm.     Heart sounds: Normal heart sounds.  Pulmonary:     Effort: No respiratory distress.     Breath sounds: No stridor. No wheezing.  Abdominal:     General: Bowel sounds are normal. There is no distension.     Palpations: Abdomen is soft. There is no mass.     Tenderness: There is no abdominal tenderness. There is no guarding or rebound.  Musculoskeletal:        General: Tenderness present.     Cervical back: Normal range of motion and neck supple. No rigidity.  Lymphadenopathy:     Cervical: No cervical adenopathy.  Skin:    Findings: No erythema or rash.  Neurological:     Mental Status: She is oriented to person, place, and time.     Cranial Nerves: No cranial nerve deficit.     Motor: No abnormal muscle tone.      Coordination: Coordination normal.     Deep Tendon Reflexes: Reflexes normal.  Psychiatric:        Behavior: Behavior normal.        Thought Content: Thought content normal.        Judgment: Judgment normal.    Lab Results  Component Value Date   WBC 6.5 12/04/2021   HGB 13.2 12/04/2021   HCT 40.4 12/04/2021   PLT 208.0 12/04/2021   GLUCOSE 88 12/04/2021   CHOL 185 12/04/2021   TRIG 181.0 (H) 12/04/2021   HDL 42.80 12/04/2021   LDLCALC 106 (H) 12/04/2021   ALT 25 12/04/2021   AST 18 12/04/2021   NA 140 12/04/2021   K 4.4 12/04/2021   CL 100 12/04/2021   CREATININE 0.96 12/04/2021   BUN 15 12/04/2021   CO2 32 12/04/2021   TSH 0.86 12/04/2021   INR 1.09 07/13/2013   HGBA1C 6.3 12/04/2021    US Renal  Result Date: 08/05/2019 CLINICAL DATA:  Chronic kidney disease stage 3, decreased GFR EXAM: RENAL / URINARY TRACT ULTRASOUND COMPLETE COMPARISON:  None. FINDINGS: Right Kidney: Renal measurements: 11 x 4.5 x 6.5 cm = volume: 171 mL. Normal echogenicity and cortical thickness. No hydronephrosis or acute finding. Small cortical simple and complex renal cysts noted largest measures 1.5 cm in the upper pole. No acute finding. Left Kidney: Renal measurements: 11.2 x 5.7 x 7.3 cm = volume: 243 mL. Lower pole exophytic anechoic cyst measures 5.5 cm. Bladder: Normal appearance by ultrasound. Nonspecific hypoechoic rounded soft tissue at the bladder base, measuring up to 2.2 cm, close to the vaginal canal or cervical region. This is not fully characterized by ultrasound. Consider further evaluation with CT if warranted. IMPRESSION: No acute finding by renal ultrasound. No hydronephrosis. Scattered small renal cysts some are complex. Nonspecific soft tissue prominence along the bladder base, not fully characterized by ultrasound. See above comment. Electronically Signed   By: Jerilynn Mages.  Shick M.D.   On: 08/05/2019 08:38    Assessment & Plan:   Problem List Items Addressed This Visit     Essential  hypertension    Chronic  Coreg, Furosemide      Relevant Medications   carvedilol (COREG) 25 MG tablet   furosemide (LASIX) 20 MG tablet   Other Relevant Orders   TSH (Completed)   Urinalysis (Completed)   CBC with Differential/Platelet (Completed)   Lipid panel (Completed)   Comprehensive metabolic panel (Completed)   Hemoglobin A1c (Completed)   Hyperglycemia - Primary  Relevant Orders   TSH (Completed)   Urinalysis (Completed)   CBC with Differential/Platelet (Completed)   Lipid panel (Completed)   Comprehensive metabolic panel (Completed)   Hemoglobin A1c (Completed)   Obesity    Wt Readings from Last 3 Encounters:  12/04/21 278 lb 9.6 oz (126.4 kg)  12/29/19 261 lb (118.4 kg)  06/28/19 256 lb (116.1 kg)        Vitamin D deficiency    On Vit D      Well adult exam   Relevant Orders   TSH (Completed)   Urinalysis (Completed)   CBC with Differential/Platelet (Completed)   Lipid panel (Completed)   Comprehensive metabolic panel (Completed)      Meds ordered this encounter  Medications   carvedilol (COREG) 25 MG tablet    Sig: Take 1 tablet (25 mg total) by mouth 2 (two) times daily with a meal.    Dispense:  180 tablet    Refill:  3   furosemide (LASIX) 20 MG tablet    Sig: TAKE 1 TABLET BY MOUTH EVERY DAY *Must keep 11/28/21 appt for future refills*    Dispense:  90 tablet    Refill:  3   mometasone-formoterol (DULERA) 200-5 MCG/ACT AERO    Sig: Inhale 3 puffs into the lungs 2 (two) times daily. Must keep 11/28/21 appt for future refills    Dispense:  39 g    Refill:  3   montelukast (SINGULAIR) 10 MG tablet    Sig: Take 1 tablet (10 mg total) by mouth daily. Must keep 11/28/21 appt for future refills    Dispense:  90 tablet    Refill:  3      Follow-up: Return in about 3 months (around 03/04/2022) for Wellness Exam.  Walker Kehr, MD

## 2021-12-04 NOTE — Assessment & Plan Note (Signed)
Chronic  Coreg, Furosemide

## 2021-12-04 NOTE — Assessment & Plan Note (Signed)
Wt Readings from Last 3 Encounters:  12/04/21 278 lb 9.6 oz (126.4 kg)  12/29/19 261 lb (118.4 kg)  06/28/19 256 lb (116.1 kg)

## 2021-12-04 NOTE — Assessment & Plan Note (Signed)
On Vit D 

## 2021-12-05 ENCOUNTER — Other Ambulatory Visit: Payer: Self-pay | Admitting: Internal Medicine

## 2021-12-05 LAB — COMPREHENSIVE METABOLIC PANEL
ALT: 25 U/L (ref 0–35)
AST: 18 U/L (ref 0–37)
Albumin: 4.1 g/dL (ref 3.5–5.2)
Alkaline Phosphatase: 61 U/L (ref 39–117)
BUN: 15 mg/dL (ref 6–23)
CO2: 32 mEq/L (ref 19–32)
Calcium: 10.1 mg/dL (ref 8.4–10.5)
Chloride: 100 mEq/L (ref 96–112)
Creatinine, Ser: 0.96 mg/dL (ref 0.40–1.20)
GFR: 61.51 mL/min (ref >60.00)
Glucose, Bld: 88 mg/dL (ref 70–99)
Potassium: 4.4 mEq/L (ref 3.5–5.1)
Sodium: 140 mEq/L (ref 135–145)
Total Bilirubin: 0.5 mg/dL (ref 0.2–1.2)
Total Protein: 7.2 g/dL (ref 6.0–8.3)

## 2021-12-05 LAB — LIPID PANEL
Cholesterol: 185 mg/dL (ref 0–200)
HDL: 42.8 mg/dL (ref >39.00)
LDL Cholesterol: 106 mg/dL — ABNORMAL HIGH (ref 0–99)
NonHDL: 142.45
Total CHOL/HDL Ratio: 4
Triglycerides: 181 mg/dL — ABNORMAL HIGH (ref 0.0–149.0)
VLDL: 36.2 mg/dL (ref 0.0–40.0)

## 2021-12-05 LAB — CBC WITH DIFFERENTIAL/PLATELET
Basophils Absolute: 0.1 10*3/uL (ref 0.0–0.1)
Basophils Relative: 0.8 % (ref 0.0–3.0)
Eosinophils Absolute: 0.3 10*3/uL (ref 0.0–0.7)
Eosinophils Relative: 4.3 % (ref 0.0–5.0)
HCT: 40.4 % (ref 36.0–46.0)
Hemoglobin: 13.2 g/dL (ref 12.0–15.0)
Lymphocytes Relative: 24.5 % (ref 12.0–46.0)
Lymphs Abs: 1.6 10*3/uL (ref 0.7–4.0)
MCHC: 32.7 g/dL (ref 30.0–36.0)
MCV: 91.8 fl (ref 78.0–100.0)
Monocytes Absolute: 0.9 10*3/uL (ref 0.1–1.0)
Monocytes Relative: 13.2 % — ABNORMAL HIGH (ref 3.0–12.0)
Neutro Abs: 3.7 10*3/uL (ref 1.4–7.7)
Neutrophils Relative %: 57.2 % (ref 43.0–77.0)
Platelets: 208 10*3/uL (ref 150.0–400.0)
RBC: 4.4 Mil/uL (ref 3.87–5.11)
RDW: 14 % (ref 11.5–15.5)
WBC: 6.5 10*3/uL (ref 4.0–10.5)

## 2021-12-05 LAB — HEMOGLOBIN A1C: Hgb A1c MFr Bld: 6.3 % (ref 4.6–6.5)

## 2021-12-05 LAB — URINALYSIS
Bilirubin Urine: NEGATIVE
Hgb urine dipstick: NEGATIVE
Ketones, ur: NEGATIVE
Leukocytes,Ua: NEGATIVE
Nitrite: NEGATIVE
Specific Gravity, Urine: 1.02 (ref 1.000–1.030)
Total Protein, Urine: NEGATIVE
Urine Glucose: NEGATIVE
Urobilinogen, UA: 0.2 (ref 0.0–1.0)
pH: 5.5 (ref 5.0–8.0)

## 2021-12-05 LAB — TSH: TSH: 0.86 u[IU]/mL (ref 0.35–5.50)

## 2021-12-10 ENCOUNTER — Encounter: Payer: Self-pay | Admitting: Internal Medicine

## 2022-01-05 ENCOUNTER — Other Ambulatory Visit: Payer: Self-pay | Admitting: Internal Medicine

## 2022-02-04 ENCOUNTER — Other Ambulatory Visit: Payer: Self-pay | Admitting: Internal Medicine

## 2022-02-22 ENCOUNTER — Telehealth: Payer: Self-pay | Admitting: Internal Medicine

## 2022-02-22 NOTE — Telephone Encounter (Signed)
1.Medication Requested: loratadine (CLARITIN) 10 MG tablet ? ?carvedilol (COREG) 25 MG tablet ? ?montelukast (SINGULAIR) 10 MG tablet ? ? ?2. Pharmacy ([facility]): [facility] [place] ? ?3. On Med List: Y ? ?4. Last Visit with PCP: 12-04-2021 ? ?5. Next visit date with PCP: 03-07-2022 ? ? ?Agent: Please be advised that RX refills may take up to 3 business days. We ask that you follow-up with your pharmacy.  ?

## 2022-02-25 ENCOUNTER — Other Ambulatory Visit: Payer: Self-pay

## 2022-02-25 ENCOUNTER — Other Ambulatory Visit: Payer: Self-pay | Admitting: Internal Medicine

## 2022-02-25 MED ORDER — CARVEDILOL 25 MG PO TABS: 25.0000 mg | ORAL_TABLET | Freq: Two times a day (BID) | ORAL | 3 refills | Status: DC

## 2022-02-25 MED ORDER — LORATADINE 10 MG PO TABS: 10.0000 mg | ORAL_TABLET | Freq: Every day | ORAL | 0 refills | Status: DC

## 2022-02-25 MED ORDER — MONTELUKAST SODIUM 10 MG PO TABS: 10.0000 mg | ORAL_TABLET | Freq: Every day | ORAL | 3 refills | Status: DC

## 2022-03-07 ENCOUNTER — Ambulatory Visit: Payer: BC Managed Care – PPO | Admitting: Internal Medicine

## 2022-03-20 ENCOUNTER — Encounter: Payer: Self-pay | Admitting: Internal Medicine

## 2022-03-20 ENCOUNTER — Ambulatory Visit: Payer: BC Managed Care – PPO | Admitting: Internal Medicine

## 2022-03-20 DIAGNOSIS — J4520 Mild intermittent asthma, uncomplicated: Secondary | ICD-10-CM

## 2022-03-20 DIAGNOSIS — Z6841 Body Mass Index (BMI) 40.0 and over, adult: Secondary | ICD-10-CM

## 2022-03-20 DIAGNOSIS — N183 Chronic kidney disease, stage 3 unspecified: Secondary | ICD-10-CM

## 2022-03-20 DIAGNOSIS — L821 Other seborrheic keratosis: Secondary | ICD-10-CM

## 2022-03-20 DIAGNOSIS — N2889 Other specified disorders of kidney and ureter: Secondary | ICD-10-CM

## 2022-03-20 DIAGNOSIS — E66813 Obesity, class 3: Secondary | ICD-10-CM

## 2022-03-20 MED ORDER — PROAIR RESPICLICK 108 (90 BASE) MCG/ACT IN AEPB: 1.0000 | INHALATION_SPRAY | Freq: Four times a day (QID) | RESPIRATORY_TRACT | 11 refills | Status: DC | PRN

## 2022-03-20 MED ORDER — WEGOVY 0.5 MG/0.5ML ~~LOC~~ SOAJ: 0.5000 mg | SUBCUTANEOUS | 3 refills | Status: DC

## 2022-03-20 MED ORDER — PHENTERMINE HCL 37.5 MG PO CAPS: 37.5000 mg | ORAL_CAPSULE | ORAL | 2 refills | Status: DC

## 2022-03-20 NOTE — Assessment & Plan Note (Addendum)
Discussed intermittent fasting, Wegovy, Phentermine ?

## 2022-03-20 NOTE — Assessment & Plan Note (Signed)
Hydrate well 

## 2022-03-20 NOTE — Progress Notes (Signed)
? ?Subjective:  ?Patient ID: [name], female    DOB: 1955/12/23  Age: 66 y.o. MRN: [id number] ? ?CC: No chief complaint on file. ? ? ?HPI ?[name] presents for HTN, obesity ?C/o rash under breast ? ?Outpatient Medications Prior to Visit  ?Medication Sig Dispense Refill  ? carvedilol (COREG) 25 MG tablet Take 1 tablet (25 mg total) by mouth 2 (two) times daily with a meal. 180 tablet 3  ? furosemide (LASIX) 20 MG tablet TAKE 1 TABLET BY MOUTH EVERY DAY 30 tablet 5  ? loratadine (CLARITIN) 10 MG tablet Take 1 tablet (10 mg total) by mouth daily. Must keep 11/28/21 appt for future refills 30 tablet 0  ? mometasone-formoterol (DULERA) 200-5 MCG/ACT AERO Inhale 3 puffs into the lungs 2 (two) times daily. Must keep 11/28/21 appt for future refills 39 g 3  ? montelukast (SINGULAIR) 10 MG tablet Take 1 tablet (10 mg total) by mouth daily. Must keep 11/28/21 appt for future refills 90 tablet 3  ? triamcinolone cream (KENALOG) 0.5 % Apply 1 application topically 3 (three) times daily. Must keep 11/28/21 appt for future refills 120 g 0  ? Vitamin D, Ergocalciferol, (DRISDOL) 1.25 MG (50000 UNIT) CAPS capsule Take 1 capsule (50,000 Units total) by mouth every 7 (seven) days. 12 capsule 3  ? ?No facility-administered medications prior to visit.  ? ? ?ROS: ?Review of Systems  ?Constitutional:  Negative for activity change, appetite change, chills, fatigue and unexpected weight change.  ?HENT:  Negative for congestion, mouth sores and sinus pressure.   ?Eyes:  Negative for visual disturbance.  ?Respiratory:  Negative for cough and chest tightness.   ?Gastrointestinal:  Negative for abdominal pain and nausea.  ?Genitourinary:  Negative for difficulty urinating, frequency and vaginal pain.  ?Musculoskeletal:  Negative for back pain and gait problem.  ?Skin:  Negative for pallor and rash.  ?Neurological:  Negative for dizziness, tremors, weakness, numbness and headaches.  ?Psychiatric/Behavioral:  Negative for confusion  and sleep disturbance.   ? ?Objective:  ?BP 120/84 (BP Location: Left Arm, Patient Position: Sitting, Cuff Size: Large)   Pulse 73   Temp 98.4 ?F (36.9 ?C) (Oral)   Ht '5\' 4"'$  (1.626 m)   Wt 281 lb (127.5 kg)   SpO2 96%   BMI 48.23 kg/m?  ? ?BP Readings from Last 3 Encounters:  ?03/20/22 120/84  ?12/04/21 (!) 150/84  ?12/29/19 (!) 142/84  ? ? ?Wt Readings from Last 3 Encounters:  ?03/20/22 281 lb (127.5 kg)  ?12/04/21 278 lb 9.6 oz (126.4 kg)  ?12/29/19 261 lb (118.4 kg)  ? ? ?Physical Exam ?Constitutional:   ?   General: She is not in acute distress. ?   Appearance: She is well-developed. She is obese.  ?HENT:  ?   Head: Normocephalic.  ?   Right Ear: External ear normal.  ?   Left Ear: External ear normal.  ?   Nose: Nose normal.  ?Eyes:  ?   General:     ?   Right eye: No discharge.     ?   Left eye: No discharge.  ?   Conjunctiva/sclera: Conjunctivae normal.  ?   Pupils: Pupils are equal, round, and reactive to light.  ?Neck:  ?   Thyroid: No thyromegaly.  ?   Vascular: No JVD.  ?   Trachea: No tracheal deviation.  ?Cardiovascular:  ?   Rate and Rhythm: Normal rate and regular rhythm.  ?   Heart sounds: Normal heart sounds.  ?  Pulmonary:  ?   Effort: No respiratory distress.  ?   Breath sounds: No stridor. No wheezing.  ?Abdominal:  ?   General: Bowel sounds are normal. There is no distension.  ?   Palpations: Abdomen is soft. There is no mass.  ?   Tenderness: There is no abdominal tenderness. There is no guarding or rebound.  ?Musculoskeletal:     ?   General: No tenderness.  ?   Cervical back: Normal range of motion and neck supple. No rigidity.  ?Lymphadenopathy:  ?   Cervical: No cervical adenopathy.  ?Skin: ?   Findings: No erythema or rash.  ?Neurological:  ?   Mental Status: She is oriented to person, place, and time.  ?   Cranial Nerves: No cranial nerve deficit.  ?   Motor: No abnormal muscle tone.  ?   Coordination: Coordination normal.  ?   Deep Tendon Reflexes: Reflexes normal.  ?Psychiatric:      ?   Behavior: Behavior normal.     ?   Thought Content: Thought content normal.     ?   Judgment: Judgment normal.  ?SKs under breasts ? ? ?Lab Results  ?Component Value Date  ? WBC 6.5 12/04/2021  ? HGB 13.2 12/04/2021  ? HCT 40.4 12/04/2021  ? PLT 208.0 12/04/2021  ? GLUCOSE 88 12/04/2021  ? CHOL 185 12/04/2021  ? TRIG 181.0 (H) 12/04/2021  ? HDL 42.80 12/04/2021  ? LDLCALC 106 (H) 12/04/2021  ? ALT 25 12/04/2021  ? AST 18 12/04/2021  ? NA 140 12/04/2021  ? K 4.4 12/04/2021  ? CL 100 12/04/2021  ? CREATININE 0.96 12/04/2021  ? BUN 15 12/04/2021  ? CO2 32 12/04/2021  ? TSH 0.86 12/04/2021  ? INR 1.09 07/13/2013  ? HGBA1C 6.3 12/04/2021  ? ? ?US Renal ? ?Result Date: 08/05/2019 ?CLINICAL DATA:  Chronic kidney disease stage 3, decreased GFR EXAM: RENAL / URINARY TRACT ULTRASOUND COMPLETE COMPARISON:  None. FINDINGS: Right Kidney: Renal measurements: 11 x 4.5 x 6.5 cm = volume: 171 mL. Normal echogenicity and cortical thickness. No hydronephrosis or acute finding. Small cortical simple and complex renal cysts noted largest measures 1.5 cm in the upper pole. No acute finding. Left Kidney: Renal measurements: 11.2 x 5.7 x 7.3 cm = volume: 243 mL. Lower pole exophytic anechoic cyst measures 5.5 cm. Bladder: Normal appearance by ultrasound. Nonspecific hypoechoic rounded soft tissue at the bladder base, measuring up to 2.2 cm, close to the vaginal canal or cervical region. This is not fully characterized by ultrasound. Consider further evaluation with CT if warranted. IMPRESSION: No acute finding by renal ultrasound. No hydronephrosis. Scattered small renal cysts some are complex. Nonspecific soft tissue prominence along the bladder base, not fully characterized by ultrasound. See above comment. Electronically Signed   By: Jerilynn Mages.  Shick M.D.   On: 08/05/2019 08:38  ? ? ?Assessment & Plan:  ? ?Problem List Items Addressed This Visit   ? ? Obesity  ?  Discussed intermittent fasting, Wegovy, Phentermine ? ?  ?  ? Relevant  Medications  ? Semaglutide-Weight Management (WEGOVY) 0.5 MG/0.5ML SOAJ  ? phentermine 37.5 MG capsule  ? Asthma  ?  Coughing spells ?Proair MDI prn ? ?  ?  ? Relevant Medications  ? Albuterol Sulfate (PROAIR RESPICLICK) 478 (90 Base) MCG/ACT AEPB  ? Chronic renal insufficiency, stage 3 (moderate) (HCC)  ?  Hydrate well ? ?  ?  ? Seborrheic keratosis  ?  SKs under both breasts ?  The nature of the process and potential treatment options were discussed. ? ?  ?  ?  ? ? ?Meds ordered this encounter  ?Medications  ? Semaglutide-Weight Management (WEGOVY) 0.5 MG/0.5ML SOAJ  ?  Sig: Inject 0.5 mg into the skin once a week.  ?  Dispense:  2 mL  ?  Refill:  3  ? phentermine 37.5 MG capsule  ?  Sig: Take 1 capsule (37.5 mg total) by mouth every morning.  ?  Dispense:  30 capsule  ?  Refill:  2  ? Albuterol Sulfate (PROAIR RESPICLICK) 574 (90 Base) MCG/ACT AEPB  ?  Sig: Inhale 1-2 puffs into the lungs 4 (four) times daily as needed.  ?  Dispense:  1 each  ?  Refill:  11  ?  ? ? ?Follow-up: Return in about 3 months (around 06/19/2022) for a follow-up visit. ? ?Walker Kehr, MD ?

## 2022-03-20 NOTE — Patient Instructions (Signed)
Sign up for [employer] library ( via Libby app on your phone or your ipad). If you don't have a library card  - go to any library branch. They will set you up in 15 minutes. It is free. You can check out books to read and to listen, check out magazines and newspapers, movies etc.   The Obesity Code book by Jason Fung   These suggestions will probably help you to improve your metabolism if you are not overweight and to lose weight if you are overweight: 1.  Reduce your consumption of sugars and starches.  Eliminate high fructose corn syrup from your diet.  Reduce your consumption of processed foods.  For desserts try to have seasonal fruits, berries, nuts, cheeses or dark chocolate with more than 70% cacao. 2.  Do not snack 3.  You do not have to eat breakfast.  If you choose to have breakfast - eat plain greek yogurt, eggs, oatmeal (without sugar) - use honey if you need to. 4.  Drink water, freshly brewed unsweetened tea (green, black or herbal) or coffee.  Do not drink sodas including diet sodas , juices, beverages sweetened with artificial sweeteners. 5.  Reduce your consumption of refined grains. 6.  Avoid protein drinks such as Optifast, Slim fast etc. Eat chicken, fish, meat, dairy and beans for your sources of protein. 7.  Natural unprocessed fats like cold pressed virgin olive oil, butter, coconut oil are good for you.  Eat avocados. 8.  Increase your consumption of fiber.  Fruits, berries, vegetables, whole grains, flaxseed, chia seeds, beans, popcorn, nuts, oatmeal are good sources of fiber 9.  Use vinegar in your diet, i.e. apple cider vinegar, red wine or balsamic vinegar 10.  You can try fasting.  For example you can skip breakfast and lunch every other day (24-hour fast) 11.  Stress reduction, good night sleep, relaxation, meditation, yoga and other physical activity is likely to help you to maintain low weight too. 12.  If you drink alcohol, limit your alcohol intake to no more than  2 drinks a day.  

## 2022-03-20 NOTE — Assessment & Plan Note (Signed)
Coughing spells ?Proair MDI prn ?

## 2022-03-26 ENCOUNTER — Telehealth: Payer: Self-pay | Admitting: *Deleted

## 2022-03-26 NOTE — Telephone Encounter (Signed)
Rec'd msg off cover-my-meds " This request has received a Unfavorable outcome.". It states service is not covered ung=der pt plan.Marland KitchenJohny Chess ?

## 2022-03-26 NOTE — Telephone Encounter (Signed)
REC'D PA FOR WEGOVY completed via cover-my-meds w/  (Key: [id number]). PA sent to insurance plan for determination../lm,b ?

## 2022-03-28 ENCOUNTER — Telehealth: Payer: Self-pay | Admitting: *Deleted

## 2022-03-28 NOTE — Telephone Encounter (Signed)
Rec'd paper Pa for pt ProAir respick completed on cover-my-meds w/ Key: [id number]. Rec'd msg sent to insurance plan will recheck later for determination.Marland Kitchen/LB ?

## 2022-04-01 NOTE — Telephone Encounter (Signed)
Check cover-my-meds for status on meds. Rec'd msg stating "  This request has received a Unfavorable outcome" No alternatives was given../,b ?

## 2022-04-07 DIAGNOSIS — L821 Other seborrheic keratosis: Secondary | ICD-10-CM | POA: Insufficient documentation

## 2022-04-07 NOTE — Assessment & Plan Note (Signed)
SKs under both breasts ?The nature of the process and potential treatment options were discussed. ?

## 2022-06-19 ENCOUNTER — Ambulatory Visit: Payer: BC Managed Care – PPO | Admitting: Internal Medicine

## 2022-06-19 ENCOUNTER — Encounter: Payer: Self-pay | Admitting: Internal Medicine

## 2022-06-19 DIAGNOSIS — Z634 Disappearance and death of family member: Secondary | ICD-10-CM

## 2022-06-19 DIAGNOSIS — M5481 Occipital neuralgia: Secondary | ICD-10-CM | POA: Diagnosis not present

## 2022-06-19 DIAGNOSIS — M542 Cervicalgia: Secondary | ICD-10-CM | POA: Diagnosis not present

## 2022-06-19 DIAGNOSIS — N183 Chronic kidney disease, stage 3 unspecified: Secondary | ICD-10-CM

## 2022-06-19 DIAGNOSIS — Z6841 Body Mass Index (BMI) 40.0 and over, adult: Secondary | ICD-10-CM

## 2022-06-19 DIAGNOSIS — F4321 Adjustment disorder with depressed mood: Secondary | ICD-10-CM

## 2022-06-19 MED ORDER — PHENTERMINE HCL 37.5 MG PO CAPS: 37.5000 mg | ORAL_CAPSULE | ORAL | 2 refills | Status: DC

## 2022-06-19 NOTE — Assessment & Plan Note (Signed)
MSK Blue-Emu cream was recommended to use 2-3 times a day Rice sock heating pad

## 2022-06-19 NOTE — Assessment & Plan Note (Signed)
Stage 3a Hydrate well 

## 2022-06-19 NOTE — Progress Notes (Signed)
Subjective:  Patient ID: [name], female    DOB: Nov 05, 1956  Age: 66 y.o. MRN: [id number]  CC: No chief complaint on file.   HPI ONDA KATTNER presents for grief - son died 21 years ago Cont sharp neck pain in the back today   Outpatient Medications Prior to Visit  Medication Sig Dispense Refill   Albuterol Sulfate (PROAIR RESPICLICK) 280 (90 Base) MCG/ACT AEPB Inhale 1-2 puffs into the lungs 4 (four) times daily as needed. 1 each 11   carvedilol (COREG) 25 MG tablet Take 1 tablet (25 mg total) by mouth 2 (two) times daily with a meal. 180 tablet 3   furosemide (LASIX) 20 MG tablet TAKE 1 TABLET BY MOUTH EVERY DAY 30 tablet 5   loratadine (CLARITIN) 10 MG tablet Take 1 tablet (10 mg total) by mouth daily. Must keep 11/28/21 appt for future refills 30 tablet 0   mometasone-formoterol (DULERA) 200-5 MCG/ACT AERO Inhale 3 puffs into the lungs 2 (two) times daily. Must keep 11/28/21 appt for future refills 39 g 3   montelukast (SINGULAIR) 10 MG tablet Take 1 tablet (10 mg total) by mouth daily. Must keep 11/28/21 appt for future refills 90 tablet 3   triamcinolone cream (KENALOG) 0.5 % Apply 1 application topically 3 (three) times daily. Must keep 11/28/21 appt for future refills 120 g 0   Vitamin D, Ergocalciferol, (DRISDOL) 1.25 MG (50000 UNIT) CAPS capsule Take 1 capsule (50,000 Units total) by mouth every 7 (seven) days. 12 capsule 3   phentermine 37.5 MG capsule Take 1 capsule (37.5 mg total) by mouth every morning. 30 capsule 2   Semaglutide-Weight Management (WEGOVY) 0.5 MG/0.5ML SOAJ Inject 0.5 mg into the skin once a week. 2 mL 3   No facility-administered medications prior to visit.    ROS: Review of Systems  Constitutional:  Negative for activity change, appetite change, chills, fatigue and unexpected weight change.  HENT:  Negative for congestion, mouth sores and sinus pressure.   Eyes:  Negative for visual disturbance.  Respiratory:  Negative for cough and chest  tightness.   Gastrointestinal:  Negative for abdominal pain and nausea.  Genitourinary:  Negative for difficulty urinating, frequency and vaginal pain.  Musculoskeletal:  Positive for arthralgias and neck pain. Negative for back pain and gait problem.  Skin:  Negative for pallor and rash.  Neurological:  Negative for dizziness, tremors, weakness, numbness and headaches.  Psychiatric/Behavioral:  Negative for confusion and sleep disturbance.     Objective:  BP 130/72 (BP Location: Left Arm, Patient Position: Sitting, Cuff Size: Normal)   Pulse 80   Temp 99.4 F (37.4 C) (Oral)   Ht '5\' 4"'$  (1.626 m)   Wt 275 lb (124.7 kg)   SpO2 96%   BMI 47.20 kg/m   BP Readings from Last 3 Encounters:  06/19/22 130/72  03/20/22 120/84  12/04/21 (!) 150/84    Wt Readings from Last 3 Encounters:  06/19/22 275 lb (124.7 kg)  03/20/22 281 lb (127.5 kg)  12/04/21 278 lb 9.6 oz (126.4 kg)    Physical Exam Constitutional:      General: She is not in acute distress.    Appearance: She is well-developed.  HENT:     Head: Normocephalic.     Right Ear: External ear normal.     Left Ear: External ear normal.     Nose: Nose normal.  Eyes:     General:        Right eye: No discharge.  Left eye: No discharge.     Conjunctiva/sclera: Conjunctivae normal.     Pupils: Pupils are equal, round, and reactive to light.  Neck:     Thyroid: No thyromegaly.     Vascular: No JVD.     Trachea: No tracheal deviation.  Cardiovascular:     Rate and Rhythm: Normal rate and regular rhythm.     Heart sounds: Normal heart sounds.  Pulmonary:     Effort: No respiratory distress.     Breath sounds: No stridor. No wheezing.  Abdominal:     General: Bowel sounds are normal. There is no distension.     Palpations: Abdomen is soft. There is no mass.     Tenderness: There is no abdominal tenderness. There is no guarding or rebound.  Musculoskeletal:        General: No tenderness.     Cervical back: Normal  range of motion and neck supple. No rigidity.  Lymphadenopathy:     Cervical: No cervical adenopathy.  Skin:    Findings: No erythema or rash.  Neurological:     Cranial Nerves: No cranial nerve deficit.     Motor: No abnormal muscle tone.     Coordination: Coordination normal.     Deep Tendon Reflexes: Reflexes normal.  Psychiatric:        Behavior: Behavior normal.        Thought Content: Thought content normal.        Judgment: Judgment normal.     Lab Results  Component Value Date   WBC 6.5 12/04/2021   HGB 13.2 12/04/2021   HCT 40.4 12/04/2021   PLT 208.0 12/04/2021   GLUCOSE 88 12/04/2021   CHOL 185 12/04/2021   TRIG 181.0 (H) 12/04/2021   HDL 42.80 12/04/2021   LDLCALC 106 (H) 12/04/2021   ALT 25 12/04/2021   AST 18 12/04/2021   NA 140 12/04/2021   K 4.4 12/04/2021   CL 100 12/04/2021   CREATININE 0.96 12/04/2021   BUN 15 12/04/2021   CO2 32 12/04/2021   TSH 0.86 12/04/2021   INR 1.09 07/13/2013   HGBA1C 6.3 12/04/2021    US Renal  Result Date: 08/05/2019 CLINICAL DATA:  Chronic kidney disease stage 3, decreased GFR EXAM: RENAL / URINARY TRACT ULTRASOUND COMPLETE COMPARISON:  None. FINDINGS: Right Kidney: Renal measurements: 11 x 4.5 x 6.5 cm = volume: 171 mL. Normal echogenicity and cortical thickness. No hydronephrosis or acute finding. Small cortical simple and complex renal cysts noted largest measures 1.5 cm in the upper pole. No acute finding. Left Kidney: Renal measurements: 11.2 x 5.7 x 7.3 cm = volume: 243 mL. Lower pole exophytic anechoic cyst measures 5.5 cm. Bladder: Normal appearance by ultrasound. Nonspecific hypoechoic rounded soft tissue at the bladder base, measuring up to 2.2 cm, close to the vaginal canal or cervical region. This is not fully characterized by ultrasound. Consider further evaluation with CT if warranted. IMPRESSION: No acute finding by renal ultrasound. No hydronephrosis. Scattered small renal cysts some are complex. Nonspecific  soft tissue prominence along the bladder base, not fully characterized by ultrasound. See above comment. Electronically Signed   By: Jerilynn Mages.  Shick M.D.   On: 08/05/2019 08:38    Assessment & Plan:   Problem List Items Addressed This Visit     Chronic renal insufficiency, stage 3 (moderate) ([place])    Stage 3a   Hydrate well      Relevant Orders   Comprehensive metabolic panel   TSH   Hemoglobin  A1c   CBC with Differential/Platelet   Grief at loss of child    Worse in the summertime.  Keiarah' son died 21 years ago.  Discussed      Neck pain    [facility] Blue-Emu cream was recommended to use 2-3 times a day Rice sock heating pad      Obesity    Cont on Phentermine  Potential benefits of a long term phentermine use as well as potential risks  and complications were explained to the patient and were aknowledged. Intermittent fasting      Relevant Medications   phentermine 37.5 MG capsule   Other Relevant Orders   Comprehensive metabolic panel   TSH   Hemoglobin A1c   CBC with Differential/Platelet   Occipital neuralgia    New - B 06/2022 one time Blue-Emu cream was recommended to use 2-3 times a day Rice sock heating pad      Relevant Orders   Comprehensive metabolic panel   TSH   Hemoglobin A1c   CBC with Differential/Platelet      Meds ordered this encounter  Medications   phentermine 37.5 MG capsule    Sig: Take 1 capsule (37.5 mg total) by mouth every morning.    Dispense:  30 capsule    Refill:  2      Follow-up: Return in about 3 months (around 09/19/2022) for a follow-up visit.  Walker Kehr, MD

## 2022-06-19 NOTE — Patient Instructions (Addendum)
Occipital nerve neuralgia Blue-Emu cream -- use 2-3 times a day Rice sock heating pad  [email or web address]

## 2022-06-19 NOTE — Assessment & Plan Note (Addendum)
Cont on Phentermine  Potential benefits of a long term phentermine use as well as potential risks  and complications were explained to the patient and were aknowledged. Intermittent fasting

## 2022-06-19 NOTE — Assessment & Plan Note (Signed)
New - B 06/2022 one time Blue-Emu cream was recommended to use 2-3 times a day Rice sock heating pad

## 2022-07-08 DIAGNOSIS — F4321 Adjustment disorder with depressed mood: Secondary | ICD-10-CM | POA: Insufficient documentation

## 2022-07-08 NOTE — Assessment & Plan Note (Signed)
Worse in the summertime.  Anallely' son died 21 years ago.  Discussed

## 2022-07-17 ENCOUNTER — Encounter (INDEPENDENT_AMBULATORY_CARE_PROVIDER_SITE_OTHER): Payer: Self-pay

## 2022-07-31 ENCOUNTER — Other Ambulatory Visit: Payer: Self-pay | Admitting: Internal Medicine

## 2022-09-26 ENCOUNTER — Ambulatory Visit: Payer: BC Managed Care – PPO | Admitting: Internal Medicine

## 2022-09-30 ENCOUNTER — Encounter: Payer: Self-pay | Admitting: Internal Medicine

## 2022-09-30 ENCOUNTER — Ambulatory Visit (INDEPENDENT_AMBULATORY_CARE_PROVIDER_SITE_OTHER): Payer: No Typology Code available for payment source | Admitting: Internal Medicine

## 2022-09-30 VITALS — BP 152/74 | HR 85 | Temp 98.8°F | Ht 64.0 in | Wt 270.0 lb

## 2022-09-30 DIAGNOSIS — Z23 Encounter for immunization: Secondary | ICD-10-CM | POA: Diagnosis not present

## 2022-09-30 DIAGNOSIS — Z6841 Body Mass Index (BMI) 40.0 and over, adult: Secondary | ICD-10-CM | POA: Diagnosis not present

## 2022-09-30 DIAGNOSIS — I10 Essential (primary) hypertension: Secondary | ICD-10-CM

## 2022-09-30 DIAGNOSIS — J4520 Mild intermittent asthma, uncomplicated: Secondary | ICD-10-CM | POA: Diagnosis not present

## 2022-09-30 DIAGNOSIS — M5481 Occipital neuralgia: Secondary | ICD-10-CM | POA: Diagnosis not present

## 2022-09-30 DIAGNOSIS — N183 Chronic kidney disease, stage 3 unspecified: Secondary | ICD-10-CM | POA: Diagnosis not present

## 2022-09-30 LAB — HEMOGLOBIN A1C: Hgb A1c MFr Bld: 6.4 % (ref 4.6–6.5)

## 2022-09-30 LAB — CBC WITH DIFFERENTIAL/PLATELET
Basophils Absolute: 0 10*3/uL (ref 0.0–0.1)
Basophils Relative: 0.5 % (ref 0.0–3.0)
Eosinophils Absolute: 0.2 10*3/uL (ref 0.0–0.7)
Eosinophils Relative: 2.5 % (ref 0.0–5.0)
HCT: 39.5 % (ref 36.0–46.0)
Hemoglobin: 13.1 g/dL (ref 12.0–15.0)
Lymphocytes Relative: 20.6 % (ref 12.0–46.0)
Lymphs Abs: 1.6 10*3/uL (ref 0.7–4.0)
MCHC: 33.1 g/dL (ref 30.0–36.0)
MCV: 91.7 fl (ref 78.0–100.0)
Monocytes Absolute: 0.7 10*3/uL (ref 0.1–1.0)
Monocytes Relative: 8.8 % (ref 3.0–12.0)
Neutro Abs: 5.2 10*3/uL (ref 1.4–7.7)
Neutrophils Relative %: 67.6 % (ref 43.0–77.0)
Platelets: 244 10*3/uL (ref 150.0–400.0)
RBC: 4.31 Mil/uL (ref 3.87–5.11)
RDW: 13.3 % (ref 11.5–15.5)
WBC: 7.7 10*3/uL (ref 4.0–10.5)

## 2022-09-30 LAB — COMPREHENSIVE METABOLIC PANEL
ALT: 16 U/L (ref 0–35)
AST: 14 U/L (ref 0–37)
Albumin: 4.2 g/dL (ref 3.5–5.2)
Alkaline Phosphatase: 64 U/L (ref 39–117)
BUN: 16 mg/dL (ref 6–23)
CO2: 31 mEq/L (ref 19–32)
Calcium: 9.9 mg/dL (ref 8.4–10.5)
Chloride: 103 mEq/L (ref 96–112)
Creatinine, Ser: 0.87 mg/dL (ref 0.40–1.20)
GFR: 68.82 mL/min (ref >60.00)
Glucose, Bld: 103 mg/dL — ABNORMAL HIGH (ref 70–99)
Potassium: 3.8 mEq/L (ref 3.5–5.1)
Sodium: 141 mEq/L (ref 135–145)
Total Bilirubin: 0.5 mg/dL (ref 0.2–1.2)
Total Protein: 7.4 g/dL (ref 6.0–8.3)

## 2022-09-30 LAB — TSH: TSH: 0.79 u[IU]/mL (ref 0.35–5.50)

## 2022-09-30 MED ORDER — PROAIR RESPICLICK 108 (90 BASE) MCG/ACT IN AEPB: 1.0000 | INHALATION_SPRAY | Freq: Four times a day (QID) | RESPIRATORY_TRACT | 11 refills | Status: DC | PRN

## 2022-09-30 MED ORDER — PHENTERMINE HCL 37.5 MG PO CAPS: 37.5000 mg | ORAL_CAPSULE | ORAL | 2 refills | Status: DC

## 2022-09-30 MED ORDER — DULERA 200-5 MCG/ACT IN AERO: 3.0000 | INHALATION_SPRAY | Freq: Two times a day (BID) | RESPIRATORY_TRACT | 3 refills | Status: DC

## 2022-09-30 MED ORDER — FUROSEMIDE 20 MG PO TABS: 20.0000 mg | ORAL_TABLET | Freq: Every day | ORAL | 5 refills | Status: DC

## 2022-09-30 MED ORDER — CARVEDILOL 25 MG PO TABS: 25.0000 mg | ORAL_TABLET | Freq: Two times a day (BID) | ORAL | 3 refills | Status: DC

## 2022-09-30 MED ORDER — MONTELUKAST SODIUM 10 MG PO TABS: 10.0000 mg | ORAL_TABLET | Freq: Every day | ORAL | 3 refills | Status: DC

## 2022-09-30 NOTE — Assessment & Plan Note (Signed)
cardiac CT scan for calcium scoring offered 1/20

## 2022-09-30 NOTE — Assessment & Plan Note (Signed)
Proair MDI prn

## 2022-09-30 NOTE — Assessment & Plan Note (Signed)
On Phentermine  Potential benefits of a long term phentermine use as well as potential risks  and complications were explained to the patient and were aknowledged. Intermittent fasting 

## 2022-09-30 NOTE — Progress Notes (Signed)
Subjective:  Patient ID: [name], female    DOB: 11-08-1956  Age: 66 y.o. MRN: [id number]  CC: Follow-up (3 month f/u)   HPI EVIN CHIRCO presents for obesity, hypertension, edema  Outpatient Medications Prior to Visit  Medication Sig Dispense Refill   loratadine (CLARITIN) 10 MG tablet Take 1 tablet (10 mg total) by mouth daily. Must keep 11/28/21 appt for future refills 30 tablet 0   triamcinolone cream (KENALOG) 0.5 % Apply 1 application topically 3 (three) times daily. Must keep 11/28/21 appt for future refills 120 g 0   Vitamin D, Ergocalciferol, (DRISDOL) 1.25 MG (50000 UNIT) CAPS capsule Take 1 capsule (50,000 Units total) by mouth every 7 (seven) days. 12 capsule 3   Albuterol Sulfate (PROAIR RESPICLICK) 099 (90 Base) MCG/ACT AEPB Inhale 1-2 puffs into the lungs 4 (four) times daily as needed. 1 each 11   carvedilol (COREG) 25 MG tablet Take 1 tablet (25 mg total) by mouth 2 (two) times daily with a meal. 180 tablet 3   furosemide (LASIX) 20 MG tablet TAKE 1 TABLET BY MOUTH EVERY DAY 30 tablet 5   mometasone-formoterol (DULERA) 200-5 MCG/ACT AERO Inhale 3 puffs into the lungs 2 (two) times daily. Must keep 11/28/21 appt for future refills 39 g 3   montelukast (SINGULAIR) 10 MG tablet Take 1 tablet (10 mg total) by mouth daily. Must keep 11/28/21 appt for future refills 90 tablet 3   phentermine 37.5 MG capsule Take 1 capsule (37.5 mg total) by mouth every morning. 30 capsule 2   No facility-administered medications prior to visit.    ROS: Review of Systems  Constitutional:  Positive for fatigue. Negative for activity change, appetite change, chills and unexpected weight change.  HENT:  Negative for congestion, mouth sores and sinus pressure.   Eyes:  Negative for visual disturbance.  Respiratory:  Negative for cough, chest tightness and wheezing.   Gastrointestinal:  Negative for abdominal pain and nausea.  Genitourinary:  Negative for difficulty urinating, frequency  and vaginal pain.  Musculoskeletal:  Negative for back pain and gait problem.  Skin:  Negative for pallor and rash.  Neurological:  Negative for dizziness, tremors, weakness, numbness and headaches.  Psychiatric/Behavioral:  Negative for confusion and sleep disturbance.     Objective:  BP (!) 152/74 (BP Location: Left Arm)   Pulse 85   Temp 98.8 F (37.1 C) (Oral)   Ht '5\' 4"'$  (1.626 m)   Wt 270 lb (122.5 kg)   SpO2 97%   BMI 46.35 kg/m   BP Readings from Last 3 Encounters:  09/30/22 (!) 152/74  06/19/22 130/72  03/20/22 120/84    Wt Readings from Last 3 Encounters:  09/30/22 270 lb (122.5 kg)  06/19/22 275 lb (124.7 kg)  03/20/22 281 lb (127.5 kg)    Physical Exam Constitutional:      General: She is not in acute distress.    Appearance: She is well-developed. She is obese.  HENT:     Head: Normocephalic.     Right Ear: External ear normal.     Left Ear: External ear normal.     Nose: Nose normal.  Eyes:     General:        Right eye: No discharge.        Left eye: No discharge.     Conjunctiva/sclera: Conjunctivae normal.     Pupils: Pupils are equal, round, and reactive to light.  Neck:     Thyroid: No thyromegaly.  Vascular: No JVD.     Trachea: No tracheal deviation.  Cardiovascular:     Rate and Rhythm: Normal rate and regular rhythm.     Heart sounds: Normal heart sounds.  Pulmonary:     Effort: No respiratory distress.     Breath sounds: No stridor. No wheezing.  Abdominal:     General: Bowel sounds are normal. There is no distension.     Palpations: Abdomen is soft. There is no mass.     Tenderness: There is no abdominal tenderness. There is no guarding or rebound.  Musculoskeletal:        General: No tenderness.     Cervical back: Normal range of motion and neck supple. No rigidity.  Lymphadenopathy:     Cervical: No cervical adenopathy.  Skin:    Findings: No erythema or rash.  Neurological:     Mental Status: She is oriented to person,  place, and time.     Cranial Nerves: No cranial nerve deficit.     Motor: No abnormal muscle tone.     Coordination: Coordination normal.     Deep Tendon Reflexes: Reflexes normal.  Psychiatric:        Behavior: Behavior normal.        Thought Content: Thought content normal.        Judgment: Judgment normal.    Trace edema of the ankles Lab Results  Component Value Date   WBC 7.7 09/30/2022   HGB 13.1 09/30/2022   HCT 39.5 09/30/2022   PLT 244.0 09/30/2022   GLUCOSE 103 (H) 09/30/2022   CHOL 185 12/04/2021   TRIG 181.0 (H) 12/04/2021   HDL 42.80 12/04/2021   LDLCALC 106 (H) 12/04/2021   ALT 16 09/30/2022   AST 14 09/30/2022   NA 141 09/30/2022   K 3.8 09/30/2022   CL 103 09/30/2022   CREATININE 0.87 09/30/2022   BUN 16 09/30/2022   CO2 31 09/30/2022   TSH 0.79 09/30/2022   INR 1.09 07/13/2013   HGBA1C 6.4 09/30/2022    US Renal  Result Date: 08/05/2019 CLINICAL DATA:  Chronic kidney disease stage 3, decreased GFR EXAM: RENAL / URINARY TRACT ULTRASOUND COMPLETE COMPARISON:  None. FINDINGS: Right Kidney: Renal measurements: 11 x 4.5 x 6.5 cm = volume: 171 mL. Normal echogenicity and cortical thickness. No hydronephrosis or acute finding. Small cortical simple and complex renal cysts noted largest measures 1.5 cm in the upper pole. No acute finding. Left Kidney: Renal measurements: 11.2 x 5.7 x 7.3 cm = volume: 243 mL. Lower pole exophytic anechoic cyst measures 5.5 cm. Bladder: Normal appearance by ultrasound. Nonspecific hypoechoic rounded soft tissue at the bladder base, measuring up to 2.2 cm, close to the vaginal canal or cervical region. This is not fully characterized by ultrasound. Consider further evaluation with CT if warranted. IMPRESSION: No acute finding by renal ultrasound. No hydronephrosis. Scattered small renal cysts some are complex. Nonspecific soft tissue prominence along the bladder base, not fully characterized by ultrasound. See above comment. Electronically  Signed   By: Jerilynn Mages.  Shick M.D.   On: 08/05/2019 08:38    Assessment & Plan:   Problem List Items Addressed This Visit     Asthma    Proair MDI prn      Relevant Medications   Albuterol Sulfate (PROAIR RESPICLICK) 676 (90 Base) MCG/ACT AEPB   mometasone-formoterol (DULERA) 200-5 MCG/ACT AERO   montelukast (SINGULAIR) 10 MG tablet   Chronic renal insufficiency, stage 3 (moderate) ([facility])    Stage  3a  GFR 45.6 Hydrate well      Essential hypertension     cardiac CT scan for calcium scoring offered 1/20      Relevant Medications   carvedilol (COREG) 25 MG tablet   furosemide (LASIX) 20 MG tablet   Obesity    On Phentermine  Potential benefits of a long term phentermine use as well as potential risks  and complications were explained to the patient and were aknowledged. Intermittent fasting      Relevant Medications   phentermine 37.5 MG capsule   Occipital neuralgia   Relevant Medications   phentermine 37.5 MG capsule   carvedilol (COREG) 25 MG tablet   Other Visit Diagnoses     Needs flu shot    -  Primary   Relevant Orders   Flu Vaccine QUAD High Dose(Fluad) (Completed)         Meds ordered this encounter  Medications   phentermine 37.5 MG capsule    Sig: Take 1 capsule (37.5 mg total) by mouth every morning.    Dispense:  30 capsule    Refill:  2   carvedilol (COREG) 25 MG tablet    Sig: Take 1 tablet (25 mg total) by mouth 2 (two) times daily with a meal.    Dispense:  180 tablet    Refill:  3   Albuterol Sulfate (PROAIR RESPICLICK) 601 (90 Base) MCG/ACT AEPB    Sig: Inhale 1-2 puffs into the lungs 4 (four) times daily as needed.    Dispense:  1 each    Refill:  11   furosemide (LASIX) 20 MG tablet    Sig: Take 1 tablet (20 mg total) by mouth daily.    Dispense:  30 tablet    Refill:  5   mometasone-formoterol (DULERA) 200-5 MCG/ACT AERO    Sig: Inhale 3 puffs into the lungs 2 (two) times daily. Must keep 11/28/21 appt for future refills    Dispense:   39 g    Refill:  3   montelukast (SINGULAIR) 10 MG tablet    Sig: Take 1 tablet (10 mg total) by mouth daily. Must keep 11/28/21 appt for future refills    Dispense:  90 tablet    Refill:  3      Follow-up: Return in about 3 months (around 12/31/2022) for a follow-up visit.  Walker Kehr, MD

## 2022-09-30 NOTE — Assessment & Plan Note (Signed)
Stage 3a  GFR 45.6 Hydrate well

## 2022-12-17 DIAGNOSIS — J449 Chronic obstructive pulmonary disease, unspecified: Secondary | ICD-10-CM | POA: Diagnosis not present

## 2022-12-17 DIAGNOSIS — F17211 Nicotine dependence, cigarettes, in remission: Secondary | ICD-10-CM | POA: Diagnosis not present

## 2022-12-17 DIAGNOSIS — Z008 Encounter for other general examination: Secondary | ICD-10-CM | POA: Diagnosis not present

## 2022-12-17 DIAGNOSIS — Z6841 Body Mass Index (BMI) 40.0 and over, adult: Secondary | ICD-10-CM | POA: Diagnosis not present

## 2022-12-17 DIAGNOSIS — I10 Essential (primary) hypertension: Secondary | ICD-10-CM | POA: Diagnosis not present

## 2022-12-30 DIAGNOSIS — T161XXA Foreign body in right ear, initial encounter: Secondary | ICD-10-CM | POA: Diagnosis not present

## 2022-12-30 DIAGNOSIS — H903 Sensorineural hearing loss, bilateral: Secondary | ICD-10-CM | POA: Diagnosis not present

## 2022-12-31 ENCOUNTER — Encounter: Payer: Self-pay | Admitting: Internal Medicine

## 2022-12-31 ENCOUNTER — Ambulatory Visit: Payer: No Typology Code available for payment source | Admitting: Internal Medicine

## 2022-12-31 VITALS — BP 122/82 | HR 70 | Temp 97.6°F | Ht 64.0 in | Wt 272.0 lb

## 2022-12-31 DIAGNOSIS — I10 Essential (primary) hypertension: Secondary | ICD-10-CM | POA: Diagnosis not present

## 2022-12-31 DIAGNOSIS — Z6841 Body Mass Index (BMI) 40.0 and over, adult: Secondary | ICD-10-CM | POA: Diagnosis not present

## 2022-12-31 DIAGNOSIS — J4520 Mild intermittent asthma, uncomplicated: Secondary | ICD-10-CM | POA: Diagnosis not present

## 2022-12-31 DIAGNOSIS — R739 Hyperglycemia, unspecified: Secondary | ICD-10-CM | POA: Diagnosis not present

## 2022-12-31 DIAGNOSIS — N183 Chronic kidney disease, stage 3 unspecified: Secondary | ICD-10-CM

## 2022-12-31 MED ORDER — LORATADINE 10 MG PO TABS: 10.0000 mg | ORAL_TABLET | Freq: Every day | ORAL | 3 refills | Status: DC

## 2022-12-31 MED ORDER — PHENTERMINE HCL 37.5 MG PO CAPS: 37.5000 mg | ORAL_CAPSULE | ORAL | 2 refills | Status: DC

## 2022-12-31 MED ORDER — ALBUTEROL SULFATE HFA 108 (90 BASE) MCG/ACT IN AERS: 2.0000 | INHALATION_SPRAY | RESPIRATORY_TRACT | 11 refills | Status: DC | PRN

## 2022-12-31 NOTE — Assessment & Plan Note (Signed)
On Phentermine  Potential benefits of a long term phentermine use as well as potential risks  and complications were explained to the patient and were aknowledged. Intermittent fasting

## 2022-12-31 NOTE — Progress Notes (Signed)
Subjective:  Patient ID: [name], female    DOB: June 03, 1956  Age: 67 y.o. MRN: [id number]  CC: No chief complaint on file.   HPI RASHEEMA TRULUCK presents for HTN, allergies, obesity  Outpatient Medications Prior to Visit  Medication Sig Dispense Refill   carvedilol (COREG) 25 MG tablet Take 1 tablet (25 mg total) by mouth 2 (two) times daily with a meal. 180 tablet 3   furosemide (LASIX) 20 MG tablet Take 1 tablet (20 mg total) by mouth daily. 30 tablet 5   montelukast (SINGULAIR) 10 MG tablet Take 1 tablet (10 mg total) by mouth daily. Must keep 11/28/21 appt for future refills 90 tablet 3   triamcinolone cream (KENALOG) 0.5 % Apply 1 application topically 3 (three) times daily. Must keep 11/28/21 appt for future refills 120 g 0   Vitamin D, Ergocalciferol, (DRISDOL) 1.25 MG (50000 UNIT) CAPS capsule Take 1 capsule (50,000 Units total) by mouth every 7 (seven) days. 12 capsule 3   Albuterol Sulfate (PROAIR RESPICLICK) 568 (90 Base) MCG/ACT AEPB Inhale 1-2 puffs into the lungs 4 (four) times daily as needed. 1 each 11   loratadine (CLARITIN) 10 MG tablet Take 1 tablet (10 mg total) by mouth daily. Must keep 11/28/21 appt for future refills 30 tablet 0   mometasone-formoterol (DULERA) 200-5 MCG/ACT AERO Inhale 3 puffs into the lungs 2 (two) times daily. Must keep 11/28/21 appt for future refills 39 g 3   phentermine 37.5 MG capsule Take 1 capsule (37.5 mg total) by mouth every morning. 30 capsule 2   No facility-administered medications prior to visit.    ROS: Review of Systems  Constitutional:  Negative for activity change, appetite change, chills, fatigue and unexpected weight change.  HENT:  Negative for congestion, mouth sores and sinus pressure.   Eyes:  Negative for visual disturbance.  Respiratory:  Negative for cough and chest tightness.   Gastrointestinal:  Negative for abdominal pain and nausea.  Genitourinary:  Negative for difficulty urinating, frequency and vaginal  pain.  Musculoskeletal:  Negative for back pain and gait problem.  Skin:  Negative for pallor and rash.  Neurological:  Negative for dizziness, tremors, weakness, numbness and headaches.  Psychiatric/Behavioral:  Negative for confusion and sleep disturbance.     Objective:  BP 122/82 (BP Location: Right Arm, Patient Position: Sitting, Cuff Size: Normal)   Pulse 70   Temp 97.6 F (36.4 C) (Oral)   Ht '5\' 4"'$  (1.626 m)   Wt 272 lb (123.4 kg)   SpO2 93%   BMI 46.69 kg/m   BP Readings from Last 3 Encounters:  12/31/22 122/82  09/30/22 (!) 152/74  06/19/22 130/72    Wt Readings from Last 3 Encounters:  12/31/22 272 lb (123.4 kg)  09/30/22 270 lb (122.5 kg)  06/19/22 275 lb (124.7 kg)    Physical Exam Constitutional:      General: She is not in acute distress.    Appearance: She is well-developed. She is obese.  HENT:     Head: Normocephalic.     Right Ear: External ear normal.     Left Ear: External ear normal.     Nose: Nose normal.  Eyes:     General:        Right eye: No discharge.        Left eye: No discharge.     Conjunctiva/sclera: Conjunctivae normal.     Pupils: Pupils are equal, round, and reactive to light.  Neck:  Thyroid: No thyromegaly.     Vascular: No JVD.     Trachea: No tracheal deviation.  Cardiovascular:     Rate and Rhythm: Normal rate and regular rhythm.     Heart sounds: Normal heart sounds.  Pulmonary:     Effort: No respiratory distress.     Breath sounds: No stridor. No wheezing.  Abdominal:     General: Bowel sounds are normal. There is no distension.     Palpations: Abdomen is soft. There is no mass.     Tenderness: There is no abdominal tenderness. There is no guarding or rebound.  Musculoskeletal:        General: No tenderness.     Cervical back: Normal range of motion and neck supple. No rigidity.  Lymphadenopathy:     Cervical: No cervical adenopathy.  Skin:    Findings: No erythema or rash.  Neurological:     Mental  Status: She is oriented to person, place, and time.     Cranial Nerves: No cranial nerve deficit.     Motor: No abnormal muscle tone.     Coordination: Coordination normal.     Deep Tendon Reflexes: Reflexes normal.  Psychiatric:        Behavior: Behavior normal.        Thought Content: Thought content normal.        Judgment: Judgment normal.     Lab Results  Component Value Date   WBC 7.7 09/30/2022   HGB 13.1 09/30/2022   HCT 39.5 09/30/2022   PLT 244.0 09/30/2022   GLUCOSE 103 (H) 09/30/2022   CHOL 185 12/04/2021   TRIG 181.0 (H) 12/04/2021   HDL 42.80 12/04/2021   LDLCALC 106 (H) 12/04/2021   ALT 16 09/30/2022   AST 14 09/30/2022   NA 141 09/30/2022   K 3.8 09/30/2022   CL 103 09/30/2022   CREATININE 0.87 09/30/2022   BUN 16 09/30/2022   CO2 31 09/30/2022   TSH 0.79 09/30/2022   INR 1.09 07/13/2013   HGBA1C 6.4 09/30/2022    US Renal  Result Date: 08/05/2019 CLINICAL DATA:  Chronic kidney disease stage 3, decreased GFR EXAM: RENAL / URINARY TRACT ULTRASOUND COMPLETE COMPARISON:  None. FINDINGS: Right Kidney: Renal measurements: 11 x 4.5 x 6.5 cm = volume: 171 mL. Normal echogenicity and cortical thickness. No hydronephrosis or acute finding. Small cortical simple and complex renal cysts noted largest measures 1.5 cm in the upper pole. No acute finding. Left Kidney: Renal measurements: 11.2 x 5.7 x 7.3 cm = volume: 243 mL. Lower pole exophytic anechoic cyst measures 5.5 cm. Bladder: Normal appearance by ultrasound. Nonspecific hypoechoic rounded soft tissue at the bladder base, measuring up to 2.2 cm, close to the vaginal canal or cervical region. This is not fully characterized by ultrasound. Consider further evaluation with CT if warranted. IMPRESSION: No acute finding by renal ultrasound. No hydronephrosis. Scattered small renal cysts some are complex. Nonspecific soft tissue prominence along the bladder base, not fully characterized by ultrasound. See above comment.  Electronically Signed   By: Jerilynn Mages.  Shick M.D.   On: 08/05/2019 08:38    Assessment & Plan:   Problem List Items Addressed This Visit       Cardiovascular and Mediastinum   Essential hypertension - Primary    Chronic  Continue Coreg, Furosemide        Respiratory   Asthma    Ventolin MDI prn      Relevant Medications   albuterol (PROVENTIL HFA) 108 (  90 Base) MCG/ACT inhaler     Genitourinary   Chronic renal insufficiency, stage 3 (moderate) ([facility])    Hydrate well Monitor GFR        Other   Hyperglycemia    Check a1c      Obesity    On Phentermine  Potential benefits of a long term phentermine use as well as potential risks  and complications were explained to the patient and were aknowledged. Intermittent fasting      Relevant Medications   phentermine 37.5 MG capsule      Meds ordered this encounter  Medications   phentermine 37.5 MG capsule    Sig: Take 1 capsule (37.5 mg total) by mouth every morning.    Dispense:  30 capsule    Refill:  2   albuterol (PROVENTIL HFA) 108 (90 Base) MCG/ACT inhaler    Sig: Inhale 2 puffs into the lungs every 4 (four) hours as needed for wheezing or shortness of breath.    Dispense:  8.5 g    Refill:  11   loratadine (CLARITIN) 10 MG tablet    Sig: Take 1 tablet (10 mg total) by mouth daily. Must keep 11/28/21 appt for future refills    Dispense:  90 tablet    Refill:  3      Follow-up: Return in about 3 months (around 04/01/2023) for Wellness Exam.  Walker Kehr, MD

## 2022-12-31 NOTE — Assessment & Plan Note (Signed)
Hydrate well ?Monitor GFR ?

## 2022-12-31 NOTE — Assessment & Plan Note (Signed)
Ventolin MDI prn

## 2022-12-31 NOTE — Assessment & Plan Note (Signed)
Chronic  Continue Coreg, Furosemide

## 2022-12-31 NOTE — Assessment & Plan Note (Signed)
Check a1c 

## 2023-01-01 DIAGNOSIS — R69 Illness, unspecified: Secondary | ICD-10-CM | POA: Diagnosis not present

## 2023-01-15 DIAGNOSIS — R69 Illness, unspecified: Secondary | ICD-10-CM | POA: Diagnosis not present

## 2023-01-24 ENCOUNTER — Other Ambulatory Visit: Payer: Self-pay | Admitting: Internal Medicine

## 2023-01-29 ENCOUNTER — Other Ambulatory Visit: Payer: Self-pay | Admitting: Internal Medicine

## 2023-02-05 DIAGNOSIS — H52223 Regular astigmatism, bilateral: Secondary | ICD-10-CM | POA: Diagnosis not present

## 2023-02-05 DIAGNOSIS — H524 Presbyopia: Secondary | ICD-10-CM | POA: Diagnosis not present

## 2023-04-01 ENCOUNTER — Ambulatory Visit: Payer: No Typology Code available for payment source

## 2023-04-01 ENCOUNTER — Ambulatory Visit: Payer: No Typology Code available for payment source | Admitting: Internal Medicine

## 2023-04-01 ENCOUNTER — Ambulatory Visit
Admission: RE | Admit: 2023-04-01 | Discharge: 2023-04-01 | Disposition: A | Discharge status: Home / Self Care | Payer: No Typology Code available for payment source | Source: Ambulatory Visit | Attending: Internal Medicine | Admitting: Internal Medicine

## 2023-04-01 ENCOUNTER — Encounter: Payer: Self-pay | Admitting: Internal Medicine

## 2023-04-01 VITALS — BP 140/90 | HR 86 | Temp 99.1°F | Ht 64.0 in | Wt 269.0 lb

## 2023-04-01 DIAGNOSIS — G8929 Other chronic pain: Secondary | ICD-10-CM

## 2023-04-01 DIAGNOSIS — M545 Low back pain, unspecified: Secondary | ICD-10-CM | POA: Diagnosis not present

## 2023-04-01 DIAGNOSIS — N183 Chronic kidney disease, stage 3 unspecified: Secondary | ICD-10-CM

## 2023-04-01 DIAGNOSIS — R739 Hyperglycemia, unspecified: Secondary | ICD-10-CM

## 2023-04-01 DIAGNOSIS — I10 Essential (primary) hypertension: Secondary | ICD-10-CM

## 2023-04-01 MED ORDER — MELOXICAM 7.5 MG PO TABS: ORAL_TABLET | ORAL | 0 refills | Status: DC

## 2023-04-01 NOTE — Assessment & Plan Note (Signed)
Continue Coreg, Furosemide

## 2023-04-01 NOTE — Patient Instructions (Signed)
Blue-Emu cream -- use 2-3 times a day ? ?

## 2023-04-01 NOTE — Assessment & Plan Note (Addendum)
Worse:  MSK - severe Start Meloxicam - low dose pc Blue-Emu cream was recommended to use 2-3 times a day  Hydrate well

## 2023-04-01 NOTE — Assessment & Plan Note (Signed)
Hydrate well 

## 2023-04-01 NOTE — Assessment & Plan Note (Signed)
Recheck labs 

## 2023-04-01 NOTE — Progress Notes (Signed)
Subjective:  Patient ID: [name], female    DOB: 09-May-1956  Age: 67 y.o. MRN: [id number]  CC: No chief complaint on file.   HPI DONTASIA PARKE presents for for LBP in the middle - chronic x years, worse w/walking incline. F/u HTN  Outpatient Medications Prior to Visit  Medication Sig Dispense Refill   albuterol (PROVENTIL HFA) 108 (90 Base) MCG/ACT inhaler Inhale 2 puffs into the lungs every 4 (four) hours as needed for wheezing or shortness of breath. 8.5 g 11   carvedilol (COREG) 25 MG tablet Take 1 tablet (25 mg total) by mouth 2 (two) times daily with a meal. 180 tablet 3   furosemide (LASIX) 20 MG tablet TAKE 1 TABLET BY MOUTH EVERY DAY 30 tablet 5   loratadine (CLARITIN) 10 MG tablet Take 1 tablet (10 mg total) by mouth daily. Must keep 11/28/21 appt for future refills 90 tablet 3   montelukast (SINGULAIR) 10 MG tablet TAKE 1 TABLET(10 MG) BY MOUTH DAILY 90 tablet 3   phentermine 37.5 MG capsule Take 1 capsule (37.5 mg total) by mouth every morning. 30 capsule 2   triamcinolone cream (KENALOG) 0.5 % Apply 1 application topically 3 (three) times daily. Must keep 11/28/21 appt for future refills 120 g 0   Vitamin D, Ergocalciferol, (DRISDOL) 1.25 MG (50000 UNIT) CAPS capsule Take 1 capsule (50,000 Units total) by mouth every 7 (seven) days. 12 capsule 3   No facility-administered medications prior to visit.    ROS: Review of Systems  Constitutional:  Negative for activity change, appetite change, chills, fatigue and unexpected weight change.  HENT:  Negative for congestion, mouth sores and sinus pressure.   Eyes:  Negative for visual disturbance.  Respiratory:  Negative for cough and chest tightness.   Gastrointestinal:  Negative for abdominal pain and nausea.  Genitourinary:  Negative for difficulty urinating, frequency and vaginal pain.  Musculoskeletal:  Positive for back pain. Negative for gait problem.  Skin:  Negative for pallor and rash.  Neurological:  Negative  for dizziness, tremors, weakness, numbness and headaches.  Psychiatric/Behavioral:  Negative for confusion and sleep disturbance.     Objective:  BP (!) 140/90 (BP Location: Right Arm, Patient Position: Sitting, Cuff Size: Normal)   Pulse 86   Temp 99.1 F (37.3 C) (Oral)   Ht 5\' 4"  (1.626 m)   Wt 269 lb (122 kg)   SpO2 97%   BMI 46.17 kg/m   BP Readings from Last 3 Encounters:  04/01/23 (!) 140/90  12/31/22 122/82  09/30/22 (!) 152/74    Wt Readings from Last 3 Encounters:  04/01/23 269 lb (122 kg)  12/31/22 272 lb (123.4 kg)  09/30/22 270 lb (122.5 kg)    Physical Exam Constitutional:      General: She is not in acute distress.    Appearance: She is well-developed. She is obese.  HENT:     Head: Normocephalic.     Right Ear: External ear normal.     Left Ear: External ear normal.     Nose: Nose normal.  Eyes:     General:        Right eye: No discharge.        Left eye: No discharge.     Conjunctiva/sclera: Conjunctivae normal.     Pupils: Pupils are equal, round, and reactive to light.  Neck:     Thyroid: No thyromegaly.     Vascular: No JVD.     Trachea: No tracheal deviation.  Cardiovascular:     Rate and Rhythm: Normal rate and regular rhythm.     Heart sounds: Normal heart sounds.  Pulmonary:     Effort: No respiratory distress.     Breath sounds: No stridor. No wheezing.  Abdominal:     General: Bowel sounds are normal. There is no distension.     Palpations: Abdomen is soft. There is no mass.     Tenderness: There is no abdominal tenderness. There is no guarding or rebound.  Musculoskeletal:        General: No tenderness.     Cervical back: Normal range of motion and neck supple. No rigidity.  Lymphadenopathy:     Cervical: No cervical adenopathy.  Skin:    Findings: No erythema or rash.  Neurological:     Cranial Nerves: No cranial nerve deficit.     Motor: No abnormal muscle tone.     Coordination: Coordination normal.     Deep Tendon  Reflexes: Reflexes normal.  Psychiatric:        Behavior: Behavior normal.        Thought Content: Thought content normal.        Judgment: Judgment normal.     Lab Results  Component Value Date   WBC 7.7 09/30/2022   HGB 13.1 09/30/2022   HCT 39.5 09/30/2022   PLT 244.0 09/30/2022   GLUCOSE 103 (H) 09/30/2022   CHOL 185 12/04/2021   TRIG 181.0 (H) 12/04/2021   HDL 42.80 12/04/2021   LDLCALC 106 (H) 12/04/2021   ALT 16 09/30/2022   AST 14 09/30/2022   NA 141 09/30/2022   K 3.8 09/30/2022   CL 103 09/30/2022   CREATININE 0.87 09/30/2022   BUN 16 09/30/2022   CO2 31 09/30/2022   TSH 0.79 09/30/2022   INR 1.09 07/13/2013   HGBA1C 6.4 09/30/2022    US Renal  Result Date: 08/05/2019 CLINICAL DATA:  Chronic kidney disease stage 3, decreased GFR EXAM: RENAL / URINARY TRACT ULTRASOUND COMPLETE COMPARISON:  None. FINDINGS: Right Kidney: Renal measurements: 11 x 4.5 x 6.5 cm = volume: 171 mL. Normal echogenicity and cortical thickness. No hydronephrosis or acute finding. Small cortical simple and complex renal cysts noted largest measures 1.5 cm in the upper pole. No acute finding. Left Kidney: Renal measurements: 11.2 x 5.7 x 7.3 cm = volume: 243 mL. Lower pole exophytic anechoic cyst measures 5.5 cm. Bladder: Normal appearance by ultrasound. Nonspecific hypoechoic rounded soft tissue at the bladder base, measuring up to 2.2 cm, close to the vaginal canal or cervical region. This is not fully characterized by ultrasound. Consider further evaluation with CT if warranted. IMPRESSION: No acute finding by renal ultrasound. No hydronephrosis. Scattered small renal cysts some are complex. Nonspecific soft tissue prominence along the bladder base, not fully characterized by ultrasound. See above comment. Electronically Signed   By: Judie Petit.  Shick M.D.   On: 08/05/2019 08:38    Assessment & Plan:   Problem List Items Addressed This Visit       Cardiovascular and Mediastinum   Essential  hypertension - Primary    Continue Coreg, Furosemide        Genitourinary   Chronic renal insufficiency, stage 3 (moderate)    Hydrate well        Other   Hyperglycemia    Re-check labs      Low back pain    Worse:  MSK - severe Start Meloxicam - low dose pc Blue-Emu cream was recommended to use  2-3 times a day  Hydrate well      Relevant Medications   meloxicam (MOBIC) 7.5 MG tablet   Other Relevant Orders   DG Lumbar Spine 2-3 Views      Meds ordered this encounter  Medications   meloxicam (MOBIC) 7.5 MG tablet    Sig: Take one qd x 2 weeks, then one a day as needed. Take with food.    Dispense:  30 tablet    Refill:  0      Follow-up: Return in about 4 weeks (around 04/29/2023) for a follow-up visit.  Sonda Primes, MD

## 2023-04-08 ENCOUNTER — Other Ambulatory Visit: Payer: Self-pay | Admitting: Internal Medicine

## 2023-04-12 ENCOUNTER — Encounter: Payer: Self-pay | Admitting: Internal Medicine

## 2023-04-25 ENCOUNTER — Ambulatory Visit: Payer: No Typology Code available for payment source | Admitting: Internal Medicine

## 2023-05-07 ENCOUNTER — Other Ambulatory Visit: Payer: Self-pay | Admitting: Internal Medicine

## 2023-05-12 ENCOUNTER — Other Ambulatory Visit: Payer: Self-pay | Admitting: Internal Medicine

## 2023-05-15 ENCOUNTER — Ambulatory Visit: Payer: No Typology Code available for payment source | Admitting: Internal Medicine

## 2023-05-15 ENCOUNTER — Encounter: Payer: Self-pay | Admitting: Internal Medicine

## 2023-05-15 VITALS — BP 120/60 | HR 95 | Temp 98.0°F | Ht 64.0 in | Wt 269.0 lb

## 2023-05-15 DIAGNOSIS — R739 Hyperglycemia, unspecified: Secondary | ICD-10-CM

## 2023-05-15 DIAGNOSIS — N183 Chronic kidney disease, stage 3 unspecified: Secondary | ICD-10-CM | POA: Diagnosis not present

## 2023-05-15 DIAGNOSIS — R609 Edema, unspecified: Secondary | ICD-10-CM | POA: Diagnosis not present

## 2023-05-15 DIAGNOSIS — Z6841 Body Mass Index (BMI) 40.0 and over, adult: Secondary | ICD-10-CM | POA: Diagnosis not present

## 2023-05-15 DIAGNOSIS — E559 Vitamin D deficiency, unspecified: Secondary | ICD-10-CM | POA: Diagnosis not present

## 2023-05-15 DIAGNOSIS — G8929 Other chronic pain: Secondary | ICD-10-CM

## 2023-05-15 DIAGNOSIS — M545 Low back pain, unspecified: Secondary | ICD-10-CM

## 2023-05-15 MED ORDER — PHENTERMINE HCL 37.5 MG PO CAPS: 37.5000 mg | ORAL_CAPSULE | Freq: Every morning | ORAL | 2 refills | Status: DC

## 2023-05-15 MED ORDER — MELOXICAM 7.5 MG PO TABS: ORAL_TABLET | ORAL | 1 refills | Status: DC

## 2023-05-15 MED ORDER — FUROSEMIDE 20 MG PO TABS: 20.0000 mg | ORAL_TABLET | Freq: Every day | ORAL | 5 refills | Status: DC

## 2023-05-15 MED ORDER — MONTELUKAST SODIUM 10 MG PO TABS: ORAL_TABLET | ORAL | 3 refills | Status: DC

## 2023-05-15 MED ORDER — CARVEDILOL 25 MG PO TABS: 25.0000 mg | ORAL_TABLET | Freq: Two times a day (BID) | ORAL | 3 refills | Status: DC

## 2023-05-15 NOTE — Assessment & Plan Note (Signed)
On Phentermine  Potential benefits of a long term phentermine use as well as potential risks  and complications were explained to the patient and were aknowledged. Intermittent fasting 

## 2023-05-15 NOTE — Patient Instructions (Signed)
Read "[facility]" by Enzo Montgomery

## 2023-05-15 NOTE — Progress Notes (Signed)
Subjective:  Patient ID: [name], female    DOB: [birth year]  Age: 67 y.o. MRN: [id number]  CC: Follow-up (4 WEEK F/U)   HPI AARTHI MAZZIOTTI presents for stress, HTN, LBP  Outpatient Medications Prior to Visit  Medication Sig Dispense Refill   albuterol (PROVENTIL HFA) 108 (90 Base) MCG/ACT inhaler Inhale 2 puffs into the lungs every 4 (four) hours as needed for wheezing or shortness of breath. 8.5 g 11   loratadine (CLARITIN) 10 MG tablet Take 1 tablet (10 mg total) by mouth daily. Must keep 11/28/21 appt for future refills 90 tablet 3   triamcinolone cream (KENALOG) 0.5 % Apply 1 application topically 3 (three) times daily. Must keep 11/28/21 appt for future refills 120 g 0   Vitamin D, Ergocalciferol, (DRISDOL) 1.25 MG (50000 UNIT) CAPS capsule Take 1 capsule (50,000 Units total) by mouth every 7 (seven) days. 12 capsule 3   carvedilol (COREG) 25 MG tablet Take 1 tablet (25 mg total) by mouth 2 (two) times daily with a meal. 180 tablet 3   furosemide (LASIX) 20 MG tablet TAKE 1 TABLET BY MOUTH EVERY DAY 30 tablet 5   meloxicam (MOBIC) 7.5 MG tablet TAKE 1 TABLET BY MOUTH EVERY DAY WITH FOOD FOR 2 WEEKS THEN AS NEEDED, TAKE WITH FOOD 30 tablet 0   montelukast (SINGULAIR) 10 MG tablet TAKE 1 TABLET(10 MG) BY MOUTH DAILY 90 tablet 3   phentermine 37.5 MG capsule TAKE 1 CAPSULE BY MOUTH ONCE DAILY IN THE MORNING 30 capsule 2   No facility-administered medications prior to visit.    ROS: Review of Systems  Constitutional:  Negative for activity change, appetite change, chills, fatigue and unexpected weight change.  HENT:  Negative for congestion, mouth sores and sinus pressure.   Eyes:  Negative for visual disturbance.  Respiratory:  Negative for cough and chest tightness.   Gastrointestinal:  Negative for abdominal pain and nausea.  Genitourinary:  Negative for difficulty urinating, frequency and vaginal pain.  Musculoskeletal:  Positive for arthralgias and back pain. Negative  for gait problem.  Skin:  Negative for pallor and rash.  Neurological:  Negative for dizziness, tremors, weakness, numbness and headaches.  Psychiatric/Behavioral:  Negative for confusion, sleep disturbance and suicidal ideas.     Objective:  BP 120/60 (BP Location: Left Arm, Patient Position: Sitting, Cuff Size: Normal)   Pulse 95   Temp 98 F (36.7 C) (Oral)   Ht 5\' 4"  (1.626 m)   Wt 269 lb (122 kg)   SpO2 95%   BMI 46.17 kg/m   BP Readings from Last 3 Encounters:  05/15/23 120/60  04/01/23 (!) 140/90  12/31/22 122/82    Wt Readings from Last 3 Encounters:  05/15/23 269 lb (122 kg)  04/01/23 269 lb (122 kg)  12/31/22 272 lb (123.4 kg)    Physical Exam Constitutional:      General: She is not in acute distress.    Appearance: She is well-developed. She is obese.  HENT:     Head: Normocephalic.     Right Ear: External ear normal.     Left Ear: External ear normal.     Nose: Nose normal.  Eyes:     General:        Right eye: No discharge.        Left eye: No discharge.     Conjunctiva/sclera: Conjunctivae normal.     Pupils: Pupils are equal, round, and reactive to light.  Neck:  Thyroid: No thyromegaly.     Vascular: No JVD.     Trachea: No tracheal deviation.  Cardiovascular:     Rate and Rhythm: Normal rate and regular rhythm.     Heart sounds: Normal heart sounds.  Pulmonary:     Effort: No respiratory distress.     Breath sounds: No stridor. No wheezing.  Abdominal:     General: Bowel sounds are normal. There is no distension.     Palpations: Abdomen is soft. There is no mass.     Tenderness: There is no abdominal tenderness. There is no guarding or rebound.  Musculoskeletal:        General: No tenderness.     Cervical back: Normal range of motion and neck supple. No rigidity.  Lymphadenopathy:     Cervical: No cervical adenopathy.  Skin:    Findings: No erythema or rash.  Neurological:     Cranial Nerves: No cranial nerve deficit.     Motor:  No abnormal muscle tone.     Coordination: Coordination normal.     Deep Tendon Reflexes: Reflexes normal.  Psychiatric:        Behavior: Behavior normal.        Thought Content: Thought content normal.        Judgment: Judgment normal.     Lab Results  Component Value Date   WBC 7.7 09/30/2022   HGB 13.1 09/30/2022   HCT 39.5 09/30/2022   PLT 244.0 09/30/2022   GLUCOSE 103 (H) 09/30/2022   CHOL 185 12/04/2021   TRIG 181.0 (H) 12/04/2021   HDL 42.80 12/04/2021   LDLCALC 106 (H) 12/04/2021   ALT 16 09/30/2022   AST 14 09/30/2022   NA 141 09/30/2022   K 3.8 09/30/2022   CL 103 09/30/2022   CREATININE 0.87 09/30/2022   BUN 16 09/30/2022   CO2 31 09/30/2022   TSH 0.79 09/30/2022   INR 1.09 07/13/2013   HGBA1C 6.4 09/30/2022    DG Lumbar Spine 2-3 Views  Result Date: 04/05/2023 CLINICAL DATA:  Chronic bilateral low back pain without sciatica. Pain for years. EXAM: LUMBAR SPINE - 2-3 VIEW COMPARISON:  None Available. FINDINGS: There are 5 non-rib-bearing lumbar vertebra. Mild straightening of normal lordosis. No listhesis. Normal vertebral body heights, no fracture or compression deformity. Disc space narrowing and spurring is most prominent at L3-L4. Mild L3-L4, L4-L5 and L5-S1 facet hypertrophy. No evidence of focal bone lesion or bone destruction. Sacroiliac joints are congruent with mild degenerative change. Right hip arthroplasty partially included in the field of view. Multiple surgical clips in the right abdomen IMPRESSION: 1. Multilevel spondylosis, degenerative disc disease is most prominent at L3-L4. 2. L3-L4 through L5-S1 facet hypertrophy. Electronically Signed   By: Narda Rutherford M.D.   On: 04/05/2023 16:39    Assessment & Plan:   Problem List Items Addressed This Visit     Obesity    On Phentermine  Potential benefits of a long term phentermine use as well as potential risks  and complications were explained to the patient and were aknowledged. Intermittent  fasting      Relevant Medications   phentermine 37.5 MG capsule   Edema    Read "[facility]" by Enzo Montgomery      Hyperglycemia   Relevant Orders   Comprehensive metabolic panel   Hemoglobin A1c   Low back pain - Primary    Blue-Emu cream was recommended to use 2-3 times a day  Hydrate well  Relevant Medications   meloxicam (MOBIC) 7.5 MG tablet   Vitamin D deficiency    On Vit D      Chronic renal insufficiency, stage 3 (moderate) (HCC)    Hydrate well 2023 GFR>60      Relevant Orders   Comprehensive metabolic panel   Hemoglobin A1c      Meds ordered this encounter  Medications   phentermine 37.5 MG capsule    Sig: Take 1 capsule (37.5 mg total) by mouth every morning.    Dispense:  30 capsule    Refill:  2   montelukast (SINGULAIR) 10 MG tablet    Sig: TAKE 1 TABLET(10 MG) BY MOUTH DAILY    Dispense:  90 tablet    Refill:  3    ZERO refills remain on this prescription. Your patient is requesting advance approval of refills for this medication to PREVENT ANY MISSED DOSES   carvedilol (COREG) 25 MG tablet    Sig: Take 1 tablet (25 mg total) by mouth 2 (two) times daily with a meal.    Dispense:  180 tablet    Refill:  3   furosemide (LASIX) 20 MG tablet    Sig: Take 1 tablet (20 mg total) by mouth daily.    Dispense:  30 tablet    Refill:  5   meloxicam (MOBIC) 7.5 MG tablet    Sig: TAKE 1 TABLET BY MOUTH EVERY DAY WITH FOOD FOR 2 WEEKS THEN AS NEEDED, TAKE WITH FOOD    Dispense:  90 tablet    Refill:  1    ZERO refills remain on this prescription. Your patient is requesting advance approval of refills for this medication to PREVENT ANY MISSED DOSES      Follow-up: Return in about 3 months (around 08/15/2023) for a follow-up visit.  Sonda Primes, MD

## 2023-05-15 NOTE — Assessment & Plan Note (Signed)
Hydrate well 2023 GFR>60

## 2023-05-15 NOTE — Assessment & Plan Note (Signed)
Blue-Emu cream was recommended to use 2-3 times a day  Hydrate well

## 2023-05-15 NOTE — Assessment & Plan Note (Signed)
On Vit D 

## 2023-05-15 NOTE — Assessment & Plan Note (Signed)
Read "[facility]oci[facility]" by Martha Stout 

## 2023-05-30 DIAGNOSIS — H43391 Other vitreous opacities, right eye: Secondary | ICD-10-CM | POA: Diagnosis not present

## 2023-06-18 ENCOUNTER — Encounter: Payer: Self-pay | Admitting: Internal Medicine

## 2023-06-18 NOTE — Telephone Encounter (Signed)
Order in for CMET & A1C.../lb

## 2023-07-24 ENCOUNTER — Encounter (INDEPENDENT_AMBULATORY_CARE_PROVIDER_SITE_OTHER): Payer: Self-pay

## 2023-08-07 ENCOUNTER — Other Ambulatory Visit (INDEPENDENT_AMBULATORY_CARE_PROVIDER_SITE_OTHER): Payer: No Typology Code available for payment source

## 2023-08-07 DIAGNOSIS — N183 Chronic kidney disease, stage 3 unspecified: Secondary | ICD-10-CM | POA: Diagnosis not present

## 2023-08-07 DIAGNOSIS — R739 Hyperglycemia, unspecified: Secondary | ICD-10-CM | POA: Diagnosis not present

## 2023-08-07 LAB — COMPREHENSIVE METABOLIC PANEL
ALT: 21 U/L (ref 0–35)
AST: 15 U/L (ref 0–37)
Albumin: 3.9 g/dL (ref 3.5–5.2)
Alkaline Phosphatase: 60 U/L (ref 39–117)
BUN: 15 mg/dL (ref 6–23)
CO2: 32 mEq/L (ref 19–32)
Calcium: 9.6 mg/dL (ref 8.4–10.5)
Chloride: 104 mEq/L (ref 96–112)
Creatinine, Ser: 0.94 mg/dL (ref 0.40–1.20)
GFR: 62.35 mL/min (ref >60.00)
Glucose, Bld: 88 mg/dL (ref 70–99)
Potassium: 4.1 mEq/L (ref 3.5–5.1)
Sodium: 141 mEq/L (ref 135–145)
Total Bilirubin: 0.6 mg/dL (ref 0.2–1.2)
Total Protein: 7 g/dL (ref 6.0–8.3)

## 2023-08-07 LAB — HEMOGLOBIN A1C: Hgb A1c MFr Bld: 6.3 % (ref 4.6–6.5)

## 2023-08-14 ENCOUNTER — Ambulatory Visit: Payer: No Typology Code available for payment source | Admitting: Internal Medicine

## 2023-08-26 ENCOUNTER — Encounter: Payer: Self-pay | Admitting: Internal Medicine

## 2023-08-26 ENCOUNTER — Ambulatory Visit: Payer: No Typology Code available for payment source | Admitting: Internal Medicine

## 2023-08-26 VITALS — BP 120/80 | HR 83 | Temp 98.6°F | Ht 64.0 in | Wt 269.0 lb

## 2023-08-26 DIAGNOSIS — G8929 Other chronic pain: Secondary | ICD-10-CM

## 2023-08-26 DIAGNOSIS — Z23 Encounter for immunization: Secondary | ICD-10-CM | POA: Diagnosis not present

## 2023-08-26 DIAGNOSIS — R739 Hyperglycemia, unspecified: Secondary | ICD-10-CM | POA: Diagnosis not present

## 2023-08-26 DIAGNOSIS — E559 Vitamin D deficiency, unspecified: Secondary | ICD-10-CM | POA: Diagnosis not present

## 2023-08-26 DIAGNOSIS — M545 Low back pain, unspecified: Secondary | ICD-10-CM | POA: Diagnosis not present

## 2023-08-26 DIAGNOSIS — N183 Chronic kidney disease, stage 3 unspecified: Secondary | ICD-10-CM

## 2023-08-26 MED ORDER — MELOXICAM 7.5 MG PO TABS: ORAL_TABLET | ORAL | 0 refills | Status: DC

## 2023-08-26 MED ORDER — PHENTERMINE HCL 37.5 MG PO CAPS: 37.5000 mg | ORAL_CAPSULE | Freq: Every morning | ORAL | 2 refills | Status: DC

## 2023-08-26 NOTE — Assessment & Plan Note (Signed)
On Vit D

## 2023-08-26 NOTE — Assessment & Plan Note (Signed)
Hydrate well 2023 GFR>60

## 2023-08-26 NOTE — Progress Notes (Signed)
Subjective:  Patient ID: [name], female    DOB: 03-19-1956  Age: 67 y.o. MRN: [id number]  CC: Follow-up (3 mnth f/u/)   HPI [name] presents for HTN, LBP - worse, obesity  Outpatient Medications Prior to Visit  Medication Sig Dispense Refill  . albuterol (PROVENTIL HFA) 108 (90 Base) MCG/ACT inhaler Inhale 2 puffs into the lungs every 4 (four) hours as needed for wheezing or shortness of breath. 8.5 g 11  . carvedilol (COREG) 25 MG tablet Take 1 tablet (25 mg total) by mouth 2 (two) times daily with a meal. 180 tablet 3  . furosemide (LASIX) 20 MG tablet Take 1 tablet (20 mg total) by mouth daily. 30 tablet 5  . loratadine (CLARITIN) 10 MG tablet Take 1 tablet (10 mg total) by mouth daily. Must keep 11/28/21 appt for future refills 90 tablet 3  . montelukast (SINGULAIR) 10 MG tablet TAKE 1 TABLET(10 MG) BY MOUTH DAILY 90 tablet 3  . triamcinolone cream (KENALOG) 0.5 % Apply 1 application topically 3 (three) times daily. Must keep 11/28/21 appt for future refills 120 g 0  . Vitamin D, Ergocalciferol, (DRISDOL) 1.25 MG (50000 UNIT) CAPS capsule Take 1 capsule (50,000 Units total) by mouth every 7 (seven) days. 12 capsule 3  . meloxicam (MOBIC) 7.5 MG tablet TAKE 1 TABLET BY MOUTH EVERY DAY WITH FOOD FOR 2 WEEKS THEN AS NEEDED, TAKE WITH FOOD 90 tablet 1  . phentermine 37.5 MG capsule Take 1 capsule (37.5 mg total) by mouth every morning. 30 capsule 2   No facility-administered medications prior to visit.    ROS: Review of Systems  Constitutional:  Negative for activity change, appetite change, chills, fatigue and unexpected weight change.  HENT:  Negative for congestion, mouth sores and sinus pressure.   Eyes:  Negative for visual disturbance.  Respiratory:  Negative for cough and chest tightness.   Gastrointestinal:  Negative for abdominal pain and nausea.  Genitourinary:  Negative for difficulty urinating, frequency and vaginal pain.  Musculoskeletal:  Positive for  back pain and gait problem.  Skin:  Negative for pallor and rash.  Neurological:  Negative for dizziness, tremors, weakness, numbness and headaches.  Psychiatric/Behavioral:  Negative for confusion, sleep disturbance and suicidal ideas.     Objective:  BP 120/80 (BP Location: Left Arm, Patient Position: Sitting, Cuff Size: Large)   Pulse 83   Temp 98.6 F (37 C) (Oral)   Ht 5\' 4"  (1.626 m)   Wt 269 lb (122 kg)   SpO2 97%   BMI 46.17 kg/m   BP Readings from Last 3 Encounters:  08/26/23 120/80  05/15/23 120/60  04/01/23 (!) 140/90    Wt Readings from Last 3 Encounters:  08/26/23 269 lb (122 kg)  05/15/23 269 lb (122 kg)  04/01/23 269 lb (122 kg)    Physical Exam Constitutional:      General: She is not in acute distress.    Appearance: She is well-developed. She is obese.  HENT:     Head: Normocephalic.     Right Ear: External ear normal.     Left Ear: External ear normal.     Nose: Nose normal.  Eyes:     General:        Right eye: No discharge.        Left eye: No discharge.     Conjunctiva/sclera: Conjunctivae normal.     Pupils: Pupils are equal, round, and reactive to light.  Neck:  Thyroid: No thyromegaly.     Vascular: No JVD.     Trachea: No tracheal deviation.  Cardiovascular:     Rate and Rhythm: Normal rate and regular rhythm.     Heart sounds: Normal heart sounds.  Pulmonary:     Effort: No respiratory distress.     Breath sounds: No stridor. No wheezing.  Abdominal:     General: Bowel sounds are normal. There is no distension.     Palpations: Abdomen is soft. There is no mass.     Tenderness: There is no abdominal tenderness. There is no guarding or rebound.  Musculoskeletal:        General: No tenderness.     Cervical back: Normal range of motion and neck supple. No rigidity.  Lymphadenopathy:     Cervical: No cervical adenopathy.  Skin:    Findings: No erythema or rash.  Neurological:     Cranial Nerves: No cranial nerve deficit.      Motor: No abnormal muscle tone.     Coordination: Coordination normal.     Deep Tendon Reflexes: Reflexes normal.  Psychiatric:        Behavior: Behavior normal.        Thought Content: Thought content normal.        Judgment: Judgment normal.    Lab Results  Component Value Date   WBC 7.7 09/30/2022   HGB 13.1 09/30/2022   HCT 39.5 09/30/2022   PLT 244.0 09/30/2022   GLUCOSE 88 08/07/2023   CHOL 185 12/04/2021   TRIG 181.0 (H) 12/04/2021   HDL 42.80 12/04/2021   LDLCALC 106 (H) 12/04/2021   ALT 21 08/07/2023   AST 15 08/07/2023   NA 141 08/07/2023   K 4.1 08/07/2023   CL 104 08/07/2023   CREATININE 0.94 08/07/2023   BUN 15 08/07/2023   CO2 32 08/07/2023   TSH 0.79 09/30/2022   INR 1.09 07/13/2013   HGBA1C 6.3 08/07/2023    DG Lumbar Spine 2-3 Views  Result Date: 04/05/2023 CLINICAL DATA:  Chronic bilateral low back pain without sciatica. Pain for years. EXAM: LUMBAR SPINE - 2-3 VIEW COMPARISON:  None Available. FINDINGS: There are 5 non-rib-bearing lumbar vertebra. Mild straightening of normal lordosis. No listhesis. Normal vertebral body heights, no fracture or compression deformity. Disc space narrowing and spurring is most prominent at L3-L4. Mild L3-L4, L4-L5 and L5-S1 facet hypertrophy. No evidence of focal bone lesion or bone destruction. Sacroiliac joints are congruent with mild degenerative change. Right hip arthroplasty partially included in the field of view. Multiple surgical clips in the right abdomen IMPRESSION: 1. Multilevel spondylosis, degenerative disc disease is most prominent at L3-L4. 2. L3-L4 through L5-S1 facet hypertrophy. Electronically Signed   By: Narda Rutherford M.D.   On: 04/05/2023 16:39    Assessment & Plan:   Problem List Items Addressed This Visit     Hyperglycemia    Cont w/wt loss      Relevant Orders   Comprehensive metabolic panel   Hemoglobin A1c   Back pain    Worse - handicapped placcard      Relevant Medications    meloxicam (MOBIC) 7.5 MG tablet   Vitamin D deficiency    On Vit D      Chronic renal insufficiency, stage 3 (moderate) (HCC)    Hydrate well 2023 GFR>60      Relevant Orders   Hemoglobin A1c   Other Visit Diagnoses     Need for influenza vaccination    -  Primary   Relevant Orders   Flu Vaccine Trivalent High Dose (Fluad) (Completed)         Meds ordered this encounter  Medications  . phentermine 37.5 MG capsule    Sig: Take 1 capsule (37.5 mg total) by mouth every morning.    Dispense:  30 capsule    Refill:  2  . meloxicam (MOBIC) 7.5 MG tablet    Sig: 1 tab po every day prn pain    Dispense:  90 tablet    Refill:  0      Follow-up: Return in about 3 months (around 11/25/2023) for a follow-up visit.  Sonda Primes, MD

## 2023-08-26 NOTE — Assessment & Plan Note (Signed)
Worse - handicapped placcard

## 2023-08-26 NOTE — Assessment & Plan Note (Signed)
Cont w/wt loss

## 2023-09-03 ENCOUNTER — Other Ambulatory Visit: Payer: Self-pay | Admitting: Internal Medicine

## 2023-10-22 ENCOUNTER — Other Ambulatory Visit (INDEPENDENT_AMBULATORY_CARE_PROVIDER_SITE_OTHER): Payer: No Typology Code available for payment source

## 2023-10-22 DIAGNOSIS — N183 Chronic kidney disease, stage 3 unspecified: Secondary | ICD-10-CM | POA: Diagnosis not present

## 2023-10-22 DIAGNOSIS — R739 Hyperglycemia, unspecified: Secondary | ICD-10-CM

## 2023-10-22 LAB — COMPREHENSIVE METABOLIC PANEL
ALT: 18 U/L (ref 0–35)
AST: 17 U/L (ref 0–37)
Albumin: 3.8 g/dL (ref 3.5–5.2)
Alkaline Phosphatase: 66 U/L (ref 39–117)
BUN: 15 mg/dL (ref 6–23)
CO2: 30 meq/L (ref 19–32)
Calcium: 9.6 mg/dL (ref 8.4–10.5)
Chloride: 104 meq/L (ref 96–112)
Creatinine, Ser: 0.89 mg/dL (ref 0.40–1.20)
GFR: 66.48 mL/min (ref >60.00)
Glucose, Bld: 98 mg/dL (ref 70–99)
Potassium: 4.2 meq/L (ref 3.5–5.1)
Sodium: 139 meq/L (ref 135–145)
Total Bilirubin: 0.3 mg/dL (ref 0.2–1.2)
Total Protein: 6.7 g/dL (ref 6.0–8.3)

## 2023-10-22 LAB — HEMOGLOBIN A1C: Hgb A1c MFr Bld: 6.3 % (ref 4.6–6.5)

## 2023-10-23 ENCOUNTER — Encounter: Payer: Self-pay | Admitting: Internal Medicine

## 2023-11-13 ENCOUNTER — Telehealth: Payer: No Typology Code available for payment source | Admitting: Nurse Practitioner

## 2023-11-13 DIAGNOSIS — J329 Chronic sinusitis, unspecified: Secondary | ICD-10-CM | POA: Diagnosis not present

## 2023-11-13 MED ORDER — BENZONATATE 100 MG PO CAPS
100.0000 mg | ORAL_CAPSULE | Freq: Two times a day (BID) | ORAL | 0 refills | Status: DC | PRN
Start: 2023-11-13 — End: 2023-11-26

## 2023-11-13 MED ORDER — AZITHROMYCIN 250 MG PO TABS
ORAL_TABLET | ORAL | 0 refills | Status: AC
Start: 2023-11-13 — End: 2023-11-18

## 2023-11-13 NOTE — Assessment & Plan Note (Signed)
Acute Possibly bacterial in origin Treat with Z-Pak and Tessalon Perles for cough suppression as needed. Patient encouraged to take at home COVID test and if positive to call office back.  She reports her understanding. Patient encouraged to call office if symptoms persist or do not improve despite treatment plan for in person evaluation.  She reports her understanding.

## 2023-11-13 NOTE — Progress Notes (Signed)
   Established Patient Office Visit  An audio/visual tele-health visit was completed today for this patient. I connected with  [name] on 11/13/23 utilizing audio/visual technology and verified that I am speaking with the correct person using two identifiers. The patient was located at their home, and I was located at the office of [facility] at [facility] during the encounter. I discussed the limitations of evaluation and management by telemedicine. The patient expressed understanding and agreed to proceed.    Subjective   Patient ID: [name], female    DOB: September 04, 1956  Age: 67 y.o. MRN: [id number]  Chief Complaint  Patient presents with   Nasal Congestion    Coughing (dry cough), had chills, nasal congestion, Pt been taking night/dayquil. Symptoms started 7 days. Have not tested    Patient has for virtual visit for the above.  Reports symptom onset about 7 days ago.  She had similar symptoms about 2 weeks ago but the symptoms initially improved.  She reports being exposed to her great grandchildren who had similar symptoms 2 days before the symptoms started again.  Feels generally unwell specifically experiencing chills, fatigue, congestion, sore throat, cough, shortness of breath with exertion, and overall myalgias.  Has not taken an at home COVID-19 test as of yet.  Is willing to do this though.     Review of Systems  Constitutional:  Positive for chills and malaise/fatigue.  HENT:  Positive for congestion, sinus pain and sore throat.   Respiratory:  Positive for cough and shortness of breath (with exertion). Negative for sputum production and wheezing.   Cardiovascular:  Negative for chest pain.  Musculoskeletal:  Positive for myalgias.  Neurological:  Negative for headaches.      Objective:     There were no vitals taken for this visit.   Physical Exam Comprehensive physical exam not completed today as office visit was conducted remotely.  Overall  no evidence of acute respiratory distress noted..  Patient was alert and oriented, and appeared to have appropriate judgment.   No results found for any visits on 11/13/23.    The ASCVD Risk score (Arnett DK, et al., 2019) failed to calculate for the following reasons:   The systolic blood pressure is missing    Assessment & Plan:   Problem List Items Addressed This Visit       Respiratory   Sinusitis - Primary    Acute Possibly bacterial in origin Treat with Z-Pak and Tessalon Perles for cough suppression as needed. Patient encouraged to take at home COVID test and if positive to call office back.  She reports her understanding. Patient encouraged to call office if symptoms persist or do not improve despite treatment plan for in person evaluation.  She reports her understanding.      Relevant Medications   azithromycin (ZITHROMAX) 250 MG tablet   benzonatate (TESSALON) 100 MG capsule    No follow-ups on file.    Elenore Paddy, NP

## 2023-11-13 NOTE — Telephone Encounter (Signed)
Pt has a VV with Renda Rolls at 5pm today.

## 2023-11-14 ENCOUNTER — Encounter: Payer: Self-pay | Admitting: Nurse Practitioner

## 2023-11-20 ENCOUNTER — Other Ambulatory Visit: Payer: Self-pay | Admitting: Internal Medicine

## 2023-11-20 DIAGNOSIS — M545 Low back pain, unspecified: Secondary | ICD-10-CM

## 2023-11-24 ENCOUNTER — Ambulatory Visit: Payer: No Typology Code available for payment source

## 2023-11-25 ENCOUNTER — Ambulatory Visit: Payer: No Typology Code available for payment source

## 2023-11-25 VITALS — Ht 64.0 in | Wt 269.0 lb

## 2023-11-25 DIAGNOSIS — Z87891 Personal history of nicotine dependence: Secondary | ICD-10-CM

## 2023-11-25 DIAGNOSIS — Z780 Asymptomatic menopausal state: Secondary | ICD-10-CM | POA: Diagnosis not present

## 2023-11-25 DIAGNOSIS — Z0000 Encounter for general adult medical examination without abnormal findings: Secondary | ICD-10-CM

## 2023-11-25 DIAGNOSIS — Z1231 Encounter for screening mammogram for malignant neoplasm of breast: Secondary | ICD-10-CM | POA: Diagnosis not present

## 2023-11-25 DIAGNOSIS — Z122 Encounter for screening for malignant neoplasm of respiratory organs: Secondary | ICD-10-CM | POA: Diagnosis not present

## 2023-11-25 NOTE — Patient Instructions (Signed)
[name] , Thank you for taking time to come for your Medicare Wellness Visit. I appreciate your ongoing commitment to your health goals. Please review the following plan we discussed and let me know if I can assist you in the future.   Referrals/Orders/Follow-Ups/Clinician Recommendations: You are due for a 2nd Pneumonia vaccine.  Remember to ask your PCP about the Shingles vaccine and the Advance directive package during your visit tomorrow.  It was a pleasure talking with you today and I am wishing you a very Merry Christmas.    You have an order for:   [x]   3D Mammogram  [x]   Bone Density     Please call for appointment:  [facility] [address] [place], [place] [address] [phone]  Make sure to wear two-piece clothing.  No lotions, powders, or deodorants the day of the appointment. Make sure to bring picture ID and insurance card.  Bring list of medications you are currently taking including any supplements.   Schedule your [place] screening mammogram through MyChart!   Log into your MyChart account.  Go to 'Visit' (or 'Appointments' if on mobile App) --> Schedule an Appointment  Under 'Select a Reason for Visit' choose the Mammogram Screening option.  Complete the pre-visit questions and select the time and place that best fits your schedule.    This is a list of the screening recommended for you and due dates:  Health Maintenance  Topic Date Due   Hepatitis C Screening  Never done   Screening for Lung Cancer  Never done   DEXA scan (bone density measurement)  Never done   Pneumonia Vaccine (2 of 2 - PPSV23 or PCV20) 12/09/2023*   COVID-19 Vaccine (3 - Pfizer risk series) 12/11/2023*   Mammogram  03/31/2024*   Colon Cancer Screening  03/31/2024*   Medicare Annual Wellness Visit  11/24/2024   DTaP/Tdap/Td vaccine (3 - Td or Tdap) 03/04/2027   Flu Shot  Completed   HPV Vaccine  Aged Out   Zoster (Shingles) Vaccine  Discontinued   *Topic was postponed. The date shown is not the original due date.    Advanced directives: Patient need a copy  (Provided) Advance directive discussed with you today. I have provided a copy for you to complete at home and have notarized. Once this is complete, please bring a copy in to our office so we can scan it into your chart.   Next Medicare Annual Wellness Visit scheduled for next year: Yes

## 2023-11-25 NOTE — Progress Notes (Addendum)
Subjective:   [name] is a 67 y.o. female who presents for an Initial Medicare Annual Wellness Visit.  Visit Complete: Virtual I connected with  [name] on 11/25/23 by a audio enabled telemedicine application and verified that I am speaking with the correct person using two identifiers.  Patient Location: Home  Provider Location: Home Office  I discussed the limitations of evaluation and management by telemedicine. The patient expressed understanding and agreed to proceed.  Vital Signs: Because this visit was a virtual/telehealth visit, some criteria may be missing or patient reported. Any vitals not documented were not able to be obtained and vitals that have been documented are patient reported.  Patient Medicare AWV questionnaire was completed by the patient on 11/21/2023; I have confirmed that all information answered by patient is correct and no changes since this date.  Cardiac Risk Factors include: hypertension;advanced age (>34men, >53 women);Other (see comment);obesity (BMI >30kg/m2), Risk factor comments: CKD,     Objective:    Today's Vitals   11/25/23 1413  Weight: 269 lb (122 kg)  Height: 5\' 4"  (1.626 m)   Body mass index is 46.17 kg/m.     11/25/2023    2:38 PM 03/26/2016    8:55 AM 07/20/2013    3:04 PM 07/13/2013   11:35 AM  Advanced Directives  Does Patient Have a Medical Advance Directive? No No Patient does not have advance directive;Patient would not like information Patient does not have advance directive  Would patient like information on creating a medical advance directive?  Yes - Educational materials given    Pre-existing out of facility DNR order (yellow form or pink MOST form)   No     Current Medications (verified) Outpatient Encounter Medications as of 11/25/2023  Medication Sig   albuterol (PROVENTIL HFA) 108 (90 Base) MCG/ACT inhaler Inhale 2 puffs into the lungs every 4 (four) hours as needed for wheezing or shortness of  breath.   benzonatate (TESSALON) 100 MG capsule Take 1 capsule (100 mg total) by mouth 2 (two) times daily as needed for cough.   carvedilol (COREG) 25 MG tablet Take 1 tablet (25 mg total) by mouth 2 (two) times daily with a meal.   furosemide (LASIX) 20 MG tablet TAKE 1 TABLET BY MOUTH EVERY DAY   loratadine (CLARITIN) 10 MG tablet Take 1 tablet (10 mg total) by mouth daily. Must keep 11/28/21 appt for future refills   meloxicam (MOBIC) 7.5 MG tablet 1 tab po every day prn pain   montelukast (SINGULAIR) 10 MG tablet TAKE 1 TABLET(10 MG) BY MOUTH DAILY   phentermine 37.5 MG capsule Take 1 capsule (37.5 mg total) by mouth every morning.   Vitamin D, Ergocalciferol, (DRISDOL) 1.25 MG (50000 UNIT) CAPS capsule Take 1 capsule (50,000 Units total) by mouth every 7 (seven) days.   triamcinolone cream (KENALOG) 0.5 % Apply 1 application topically 3 (three) times daily. Must keep 11/28/21 appt for future refills   No facility-administered encounter medications on file as of 11/25/2023.    Allergies (verified) Hydrochlorothiazide, Phentermine hcl, Valsartan, and Verapamil   History: Past Medical History:  Diagnosis Date   Abnormal glucose 2009   Allergic rhinitis    Back pain    COPD (chronic obstructive pulmonary disease) (HCC)    Dyspnea    Food allergy    GERD (gastroesophageal reflux disease)    Headache    Migraines (rare)   HTN (hypertension)    Lactose intolerance    Leg edema  Osteoarthritis    Right Hip - SEVERE   Past Surgical History:  Procedure Laterality Date   CHOLECYSTECTOMY     COLONOSCOPY     hole repair in heart     2004 at [facility]    HYSTEROSCOPY WITH D & C N/A 04/04/2016   Procedure: DILATATION AND CURETTAGE /HYSTEROSCOPY, POLYPECTOMY WITH MYOSURE;  Surgeon: Levi Aland, MD;  Location: [facility];  Service: Gynecology;  Laterality: N/A;   KNEE ARTHROSCOPY     left    TOTAL HIP ARTHROPLASTY Right 07/20/2013   Procedure: RIGHT TOTAL HIP ARTHROPLASTY ANTERIOR  APPROACH;  Surgeon: [name], MD;  Location: [facility];  Service: Orthopedics;  Laterality: Right;   TUBAL LIGATION     WISDOM TOOTH EXTRACTION     Family History  Problem Relation Age of Onset   Parkinsonism Mother    Hypertension Mother    Hyperlipidemia Mother    Hypertension Unknown    Social History   Socioeconomic History   Marital status: Legally Separated    Spouse name: Not on file   Number of children: 2   Years of education: Not on file   Highest education level: Associate degree: academic program  Occupational History   Occupation: Audiological scientist  Tobacco Use   Smoking status: Former    Current packs/day: 0.00    Average packs/day: 0.5 packs/day for 40.0 years (20.0 ttl pk-yrs)    Types: Cigarettes    Start date: 12/09/1971    Quit date: 12/09/2011    Years since quitting: 11.9   Smokeless tobacco: Never   Tobacco comments:    2009  Vaping Use   Vaping status: Never Used  Substance and Sexual Activity   Alcohol use: Yes    Alcohol/week: 7.0 standard drinks of alcohol    Types: 7 Glasses of wine per week    Comment: 1 glass of wine nightly   Drug use: No   Sexual activity: Not Currently  Other Topics Concern   Not on file  Social History Narrative   Regular exercise - NO   Lives alone   Social Drivers of Health   Financial Resource Strain: Medium Risk (11/21/2023)   Overall Financial Resource Strain (CARDIA)    Difficulty of Paying Living Expenses: Somewhat hard  Food Insecurity: No Food Insecurity (11/21/2023)   Hunger Vital Sign    Worried About Running Out of Food in the Last Year: Never true    Ran Out of Food in the Last Year: Never true  Transportation Needs: No Transportation Needs (11/21/2023)   PRAPARE - Administrator, Civil Service (Medical): No    Lack of Transportation (Non-Medical): No  Physical Activity: Sufficiently Active (11/21/2023)   Exercise Vital Sign    Days of Exercise per Week: 4 days     Minutes of Exercise per Session: 60 min  Stress: No Stress Concern Present (11/21/2023)   [employer] of Occupational Health - Occupational Stress Questionnaire    Feeling of Stress : Only a little  Social Connections: Socially Isolated (11/21/2023)   Social Connection and Isolation Panel [NHANES]    Frequency of Communication with Friends and Family: More than three times a week    Frequency of Social Gatherings with Friends and Family: Never    Attends Religious Services: Never    Database administrator or Organizations: No    Attends Banker Meetings: Never    Marital Status: Separated    Tobacco Counseling Counseling given:  Not Answered Tobacco comments: 2009   Clinical Intake:  Pre-visit preparation completed: Yes  Pain : No/denies pain     BMI - recorded: 46.17 Nutritional Status: BMI > 30  Obese Nutritional Risks: None Diabetes: No  How often do you need to have someone help you when you read instructions, pamphlets, or other written materials from your doctor or pharmacy?: 1 - Never  Interpreter Needed?: No  Information entered by :: Loy Little, RMA   Activities of Daily Living    11/21/2023   12:48 AM  In your present state of health, do you have any difficulty performing the following activities:  Hearing? 0  Vision? 0  Difficulty concentrating or making decisions? 0  Walking or climbing stairs? 1  Dressing or bathing? 0  Doing errands, shopping? 0  Preparing Food and eating ? N  Using the Toilet? N  In the past six months, have you accidently leaked urine? N  Do you have problems with loss of bowel control? N  Managing your Medications? N  Managing your Finances? N  Housekeeping or managing your Housekeeping? N    Patient Care Team: Plotnikov, Georgina Quint, MD as PCP - General (Internal Medicine) [place], Heather, OD (Optometry)  Indicate any recent Medical Services you may have received from other than [facility] providers in the  past year (date may be approximate).     Assessment:   This is a routine wellness examination for [place].  Hearing/Vision screen Hearing Screening - Comments:: Denies hearing difficulties   Vision Screening - Comments:: Wears eyeglasses    Goals Addressed   None   Depression Screen    11/25/2023    2:42 PM 08/26/2023    4:05 PM 05/15/2023    2:17 PM 04/01/2023    3:33 PM 12/31/2022    3:09 PM 03/20/2022    4:23 PM 05/26/2018    8:20 AM  PHQ 2/9 Scores  PHQ - 2 Score 0 0 0 0 0 2 5  PHQ- 9 Score 0  0 0  2 9    Fall Risk    11/21/2023   12:48 AM 08/26/2023    4:04 PM 05/15/2023    2:17 PM 04/01/2023    3:32 PM 12/31/2022    3:09 PM  Fall Risk   Falls in the past year? 0 0 0 0 0  Number falls in past yr: 0 0 0 0 0  Injury with Fall?  0 0 0 0  Risk for fall due to :  No Fall Risks No Fall Risks  No Fall Risks  Follow up Falls evaluation completed;Falls prevention discussed Falls evaluation completed Falls evaluation completed Falls evaluation completed Falls evaluation completed    MEDICARE RISK AT HOME: Medicare Risk at Home Any stairs in or around the home?: Yes If so, are there any without handrails?: Yes Home free of loose throw rugs in walkways, pet beds, electrical cords, etc?: Yes Adequate lighting in your home to reduce risk of falls?: Yes Life alert?: No Use of a cane, walker or w/c?: No Grab bars in the bathroom?: No Shower chair or bench in shower?: No Elevated toilet seat or a handicapped toilet?: No  TIMED UP AND GO:  Was the test performed? No    Cognitive Function:        11/25/2023    2:15 PM  6CIT Screen  What Year? 0 points  What month? 0 points  What time? 0 points  Count back from 20 0 points  Months in reverse 0 points  Repeat phrase 0 points  Total Score 0 points    Immunizations Immunization History  Administered Date(s) Administered   Fluad Quad(high Dose 65+) 09/30/2022   Fluad Trivalent(High Dose 65+) 08/26/2023   Influenza  Inj Mdck Quad Pf 09/15/2018   Influenza Whole 08/23/2010, 11/08/2012   Influenza, High Dose Seasonal PF 09/08/2017   Influenza,inj,Quad PF,6+ Mos 08/26/2014, 08/28/2015, 08/18/2019   PFIZER(Purple Top)SARS-COV-2 Vaccination 02/10/2020, 03/08/2020   Pneumococcal Conjugate-13 12/28/2018   Td 06/05/2007   Tdap 03/03/2017    TDAP status: Up to date  Flu Vaccine status: Up to date  Pneumococcal vaccine status: Due, Education has been provided regarding the importance of this vaccine. Advised may receive this vaccine at local pharmacy or Health Dept. Aware to provide a copy of the vaccination record if obtained from local pharmacy or Health Dept. Verbalized acceptance and understanding.  Covid-19 vaccine status: Declined, Education has been provided regarding the importance of this vaccine but patient still declined. Advised may receive this vaccine at local pharmacy or Health Dept.or vaccine clinic. Aware to provide a copy of the vaccination record if obtained from local pharmacy or Health Dept. Verbalized acceptance and understanding.  Qualifies for Shingles Vaccine? Yes   Zostavax completed No   Shingrix Completed?: No.    Education has been provided regarding the importance of this vaccine. Patient has been advised to call insurance company to determine out of pocket expense if they have not yet received this vaccine. Advised may also receive vaccine at local pharmacy or Health Dept. Verbalized acceptance and understanding.  Screening Tests Health Maintenance  Topic Date Due   Hepatitis C Screening  Never done   Lung Cancer Screening  Never done   DEXA SCAN  Never done   Pneumonia Vaccine 61+ Years old (2 of 2 - PPSV23 or PCV20) 12/09/2023 (Originally 02/22/2019)   COVID-19 Vaccine (3 - Pfizer risk series) 12/11/2023 (Originally 04/05/2020)   MAMMOGRAM  03/31/2024 (Originally 12/09/2014)   Colonoscopy  03/31/2024 (Originally 11/01/2019)   Medicare Annual Wellness (AWV)  11/24/2024    DTaP/Tdap/Td (3 - Td or Tdap) 03/04/2027   INFLUENZA VACCINE  Completed   HPV VACCINES  Aged Out   Zoster Vaccines- Shingrix  Discontinued    Health Maintenance  Health Maintenance Due  Topic Date Due   Hepatitis C Screening  Never done   Lung Cancer Screening  Never done   DEXA SCAN  Never done    Colorectal cancer screening: Type of screening: Colonoscopy. Completed 09/04/2014. Repeat every 10 years  Mammogram status: Ordered 11/25/2023. Pt provided with contact info and advised to call to schedule appt.   Bone Density status: Ordered 11/25/2023. Pt provided with contact info and advised to call to schedule appt.  Lung Cancer Screening: (Low Dose CT Chest recommended if Age 60-80 years, 20 pack-year currently smoking OR have quit w/in 15years.) does qualify.   Lung Cancer Screening Referral: 11/25/2023  Additional Screening:  Hepatitis C Screening: does qualify;   Vision Screening: Recommended annual ophthalmology exams for early detection of glaucoma and other disorders of the eye. Is the patient up to date with their annual eye exam?  Yes  Who is the provider or what is the name of the office in which the patient attends annual eye exams? [facility] If pt is not established with a provider, would they like to be referred to a provider to establish care? No .   Dental Screening: Recommended annual dental exams for proper oral hygiene  Community Resource Referral / Chronic Care Management: CRR required this visit?  No   CCM required this visit?  No     Plan:     I have personally reviewed and noted the following in the patient's chart:   Medical and social history Use of alcohol, tobacco or illicit drugs  Current medications and supplements including opioid prescriptions. Patient is not currently taking opioid prescriptions. Functional ability and status Nutritional status Physical activity Advanced directives List of other physicians Hospitalizations, surgeries,  and ER visits in previous 12 months Vitals Screenings to include cognitive, depression, and falls Referrals and appointments  In addition, I have reviewed and discussed with patient certain preventive protocols, quality metrics, and best practice recommendations. A written personalized care plan for preventive services as well as general preventive health recommendations were provided to patient.     Chelsy Parrales L Jayzon Taras, CMA   11/25/2023   After Visit Summary: (MyChart) Due to this being a telephonic visit, the after visit summary with patients personalized plan was offered to patient via MyChart   Nurse Notes: Patient is due for a PPSV23 vaccine.  She is eligible for a lung cancer screening, which she would like to discuss with Dr. Posey Rea during her visit tomorrow.  Patient is due for a DEXa and a mammogram, which order has been placed today.  She is also due for a Hep C screening.  Patient also is requesting a Advance directive package to be given to her during her office visit tomorrow.  Patient had no other concerns to address today.     Medical screening examination/treatment/procedure(s) were performed by non-physician practitioner and as supervising physician I was immediately available for consultation/collaboration.  I agree with above. Jacinta Shoe, MD

## 2023-11-26 ENCOUNTER — Encounter: Payer: Self-pay | Admitting: Internal Medicine

## 2023-11-26 ENCOUNTER — Ambulatory Visit: Payer: No Typology Code available for payment source | Admitting: Internal Medicine

## 2023-11-26 ENCOUNTER — Ambulatory Visit
Admission: RE | Admit: 2023-11-26 | Discharge: 2023-11-26 | Disposition: A | Discharge status: Home / Self Care | Payer: No Typology Code available for payment source | Source: Ambulatory Visit | Attending: Internal Medicine | Admitting: Internal Medicine

## 2023-11-26 VITALS — BP 110/78 | HR 88 | Temp 98.6°F | Ht 64.0 in | Wt 264.0 lb

## 2023-11-26 DIAGNOSIS — I10 Essential (primary) hypertension: Secondary | ICD-10-CM | POA: Diagnosis not present

## 2023-11-26 DIAGNOSIS — Z1231 Encounter for screening mammogram for malignant neoplasm of breast: Secondary | ICD-10-CM | POA: Diagnosis not present

## 2023-11-26 DIAGNOSIS — R739 Hyperglycemia, unspecified: Secondary | ICD-10-CM | POA: Diagnosis not present

## 2023-11-26 DIAGNOSIS — E559 Vitamin D deficiency, unspecified: Secondary | ICD-10-CM

## 2023-11-26 DIAGNOSIS — Z0000 Encounter for general adult medical examination without abnormal findings: Secondary | ICD-10-CM

## 2023-11-26 DIAGNOSIS — Z6841 Body Mass Index (BMI) 40.0 and over, adult: Secondary | ICD-10-CM

## 2023-11-26 DIAGNOSIS — J4520 Mild intermittent asthma, uncomplicated: Secondary | ICD-10-CM

## 2023-11-26 DIAGNOSIS — N183 Chronic kidney disease, stage 3 unspecified: Secondary | ICD-10-CM | POA: Diagnosis not present

## 2023-11-26 DIAGNOSIS — E66813 Obesity, class 3: Secondary | ICD-10-CM | POA: Diagnosis not present

## 2023-11-26 MED ORDER — FUROSEMIDE 20 MG PO TABS: 20.0000 mg | ORAL_TABLET | Freq: Every day | ORAL | 3 refills | Status: DC

## 2023-11-26 MED ORDER — PHENTERMINE HCL 37.5 MG PO CAPS: 37.5000 mg | ORAL_CAPSULE | Freq: Every morning | ORAL | 2 refills | Status: DC

## 2023-11-26 MED ORDER — ALBUTEROL SULFATE HFA 108 (90 BASE) MCG/ACT IN AERS: 2.0000 | INHALATION_SPRAY | RESPIRATORY_TRACT | 11 refills | Status: DC | PRN

## 2023-11-26 NOTE — Assessment & Plan Note (Signed)
On Vit D 

## 2023-11-26 NOTE — Assessment & Plan Note (Signed)
On Phentermine  Potential benefits of a long term phentermine use as well as potential risks  and complications were explained to the patient and were aknowledged. Intermittent fasting 

## 2023-11-26 NOTE — Assessment & Plan Note (Signed)
Continue Coreg, Furosemide

## 2023-11-26 NOTE — Progress Notes (Signed)
Subjective:  Patient ID: [name], female    DOB: 1956-06-13  Age: 67 y.o. MRN: [id number]  CC: Medical Management of Chronic Issues (3 MNTH F/U)   HPI [name] presents for being sick w/a URI twice since Nov 2024 She did not have a COVID test. Pt took a Zpack 2 wks ago, feeling better   Outpatient Medications Prior to Visit  Medication Sig Dispense Refill  . carvedilol (COREG) 25 MG tablet Take 1 tablet (25 mg total) by mouth 2 (two) times daily with a meal. 180 tablet 3  . loratadine (CLARITIN) 10 MG tablet Take 1 tablet (10 mg total) by mouth daily. Must keep 11/28/21 appt for future refills 90 tablet 3  . meloxicam (MOBIC) 7.5 MG tablet TAKE 1 TABLET BY MOUTH EVERY DAY AS NEEDED FOR PAIN 90 tablet 0  . montelukast (SINGULAIR) 10 MG tablet TAKE 1 TABLET(10 MG) BY MOUTH DAILY 90 tablet 3  . Vitamin D, Ergocalciferol, (DRISDOL) 1.25 MG (50000 UNIT) CAPS capsule Take 1 capsule (50,000 Units total) by mouth every 7 (seven) days. 12 capsule 3  . albuterol (PROVENTIL HFA) 108 (90 Base) MCG/ACT inhaler Inhale 2 puffs into the lungs every 4 (four) hours as needed for wheezing or shortness of breath. 8.5 g 11  . furosemide (LASIX) 20 MG tablet TAKE 1 TABLET BY MOUTH EVERY DAY 30 tablet 5  . phentermine 37.5 MG capsule Take 1 capsule (37.5 mg total) by mouth every morning. 30 capsule 2  . benzonatate (TESSALON) 100 MG capsule Take 1 capsule (100 mg total) by mouth 2 (two) times daily as needed for cough. (Patient not taking: Reported on 11/26/2023) 20 capsule 0  . triamcinolone cream (KENALOG) 0.5 % Apply 1 application topically 3 (three) times daily. Must keep 11/28/21 appt for future refills 120 g 0   No facility-administered medications prior to visit.    ROS: Review of Systems  Constitutional:  Negative for activity change, appetite change, chills, fatigue and unexpected weight change.  HENT:  Negative for congestion, mouth sores and sinus pressure.   Eyes:  Negative for  visual disturbance.  Respiratory:  Negative for cough and chest tightness.   Gastrointestinal:  Negative for abdominal pain and nausea.  Genitourinary:  Negative for difficulty urinating, frequency and vaginal pain.  Musculoskeletal:  Positive for arthralgias and back pain. Negative for gait problem.  Skin:  Negative for pallor and rash.  Neurological:  Negative for dizziness, tremors, weakness, numbness and headaches.  Psychiatric/Behavioral:  Negative for confusion and sleep disturbance.     Objective:  BP 110/78 (BP Location: Right Arm, Patient Position: Sitting, Cuff Size: Normal)   Pulse 88   Temp 98.6 F (37 C) (Oral)   Ht 5\' 4"  (1.626 m)   Wt 264 lb (119.7 kg)   BMI 45.32 kg/m   BP Readings from Last 3 Encounters:  11/26/23 110/78  08/26/23 120/80  05/15/23 120/60    Wt Readings from Last 3 Encounters:  11/26/23 264 lb (119.7 kg)  11/25/23 269 lb (122 kg)  08/26/23 269 lb (122 kg)    Physical Exam Constitutional:      General: She is not in acute distress.    Appearance: She is well-developed. She is obese.  HENT:     Head: Normocephalic.     Right Ear: External ear normal.     Left Ear: External ear normal.     Nose: Nose normal.  Eyes:     General:  Right eye: No discharge.        Left eye: No discharge.     Conjunctiva/sclera: Conjunctivae normal.     Pupils: Pupils are equal, round, and reactive to light.  Neck:     Thyroid: No thyromegaly.     Vascular: No JVD.     Trachea: No tracheal deviation.  Cardiovascular:     Rate and Rhythm: Normal rate and regular rhythm.     Heart sounds: Normal heart sounds.  Pulmonary:     Effort: No respiratory distress.     Breath sounds: No stridor. No wheezing.  Abdominal:     General: Bowel sounds are normal. There is no distension.     Palpations: Abdomen is soft. There is no mass.     Tenderness: There is no abdominal tenderness. There is no guarding or rebound.  Musculoskeletal:        General: No  tenderness.     Cervical back: Normal range of motion and neck supple. No rigidity.     Right lower leg: No edema.     Left lower leg: No edema.  Lymphadenopathy:     Cervical: No cervical adenopathy.  Skin:    Findings: No erythema or rash.  Neurological:     Cranial Nerves: No cranial nerve deficit.     Motor: No abnormal muscle tone.     Coordination: Coordination normal.     Deep Tendon Reflexes: Reflexes normal.  Psychiatric:        Behavior: Behavior normal.        Thought Content: Thought content normal.        Judgment: Judgment normal.    Lab Results  Component Value Date   WBC 7.7 09/30/2022   HGB 13.1 09/30/2022   HCT 39.5 09/30/2022   PLT 244.0 09/30/2022   GLUCOSE 98 10/22/2023   CHOL 185 12/04/2021   TRIG 181.0 (H) 12/04/2021   HDL 42.80 12/04/2021   LDLCALC 106 (H) 12/04/2021   ALT 18 10/22/2023   AST 17 10/22/2023   NA 139 10/22/2023   K 4.2 10/22/2023   CL 104 10/22/2023   CREATININE 0.89 10/22/2023   BUN 15 10/22/2023   CO2 30 10/22/2023   TSH 0.79 09/30/2022   INR 1.09 07/13/2013   HGBA1C 6.3 10/22/2023    No results found.  Assessment & Plan:   Problem List Items Addressed This Visit     Obesity   On Phentermine  Potential benefits of a long term phentermine use as well as potential risks  and complications were explained to the patient and were aknowledged. Intermittent fasting      Relevant Medications   phentermine 37.5 MG capsule   Essential hypertension - Primary   Continue Coreg, Furosemide      Relevant Medications   furosemide (LASIX) 20 MG tablet   Asthma   Use Ventolin MDI prn, Singulair      Relevant Medications   albuterol (PROVENTIL HFA) 108 (90 Base) MCG/ACT inhaler   Hyperglycemia   Relevant Orders   Hemoglobin A1c   Well adult exam   Relevant Orders   TSH   Urinalysis   CBC with Differential/Platelet   Lipid panel   Comprehensive metabolic panel   Hemoglobin A1c   Vitamin D deficiency   On Vit D       Chronic renal insufficiency, stage 3 (moderate) (HCC)   Hydrate well 2023 GFR>60         Meds ordered this encounter  Medications  .  phentermine 37.5 MG capsule    Sig: Take 1 capsule (37.5 mg total) by mouth every morning.    Dispense:  30 capsule    Refill:  2  . albuterol (PROVENTIL HFA) 108 (90 Base) MCG/ACT inhaler    Sig: Inhale 2 puffs into the lungs every 4 (four) hours as needed for wheezing or shortness of breath.    Dispense:  8.5 g    Refill:  11  . furosemide (LASIX) 20 MG tablet    Sig: Take 1 tablet (20 mg total) by mouth daily.    Dispense:  90 tablet    Refill:  3      Follow-up: Return in about 3 months (around 02/24/2024) for Wellness Exam.  Sonda Primes, MD

## 2023-11-26 NOTE — Assessment & Plan Note (Addendum)
Use Ventolin MDI prn, Singulair

## 2023-11-26 NOTE — Assessment & Plan Note (Signed)
Hydrate well 2023 GFR>60

## 2023-12-01 ENCOUNTER — Encounter: Payer: Self-pay | Admitting: Internal Medicine

## 2023-12-01 ENCOUNTER — Other Ambulatory Visit: Payer: Self-pay | Admitting: Internal Medicine

## 2023-12-01 DIAGNOSIS — R928 Other abnormal and inconclusive findings on diagnostic imaging of breast: Secondary | ICD-10-CM

## 2023-12-12 ENCOUNTER — Other Ambulatory Visit: Payer: Self-pay | Admitting: Internal Medicine

## 2023-12-15 ENCOUNTER — Other Ambulatory Visit: Payer: Self-pay | Admitting: Internal Medicine

## 2023-12-15 ENCOUNTER — Other Ambulatory Visit: Payer: No Typology Code available for payment source

## 2023-12-15 ENCOUNTER — Ambulatory Visit
Admission: RE | Admit: 2023-12-15 | Discharge: 2023-12-15 | Disposition: A | Discharge status: Home / Self Care | Payer: No Typology Code available for payment source | Source: Ambulatory Visit | Attending: Internal Medicine | Admitting: Internal Medicine

## 2023-12-15 DIAGNOSIS — N6322 Unspecified lump in the left breast, upper inner quadrant: Secondary | ICD-10-CM | POA: Diagnosis not present

## 2023-12-15 DIAGNOSIS — R928 Other abnormal and inconclusive findings on diagnostic imaging of breast: Secondary | ICD-10-CM

## 2023-12-15 DIAGNOSIS — R921 Mammographic calcification found on diagnostic imaging of breast: Secondary | ICD-10-CM | POA: Diagnosis not present

## 2023-12-15 DIAGNOSIS — N6320 Unspecified lump in the left breast, unspecified quadrant: Secondary | ICD-10-CM

## 2023-12-16 ENCOUNTER — Other Ambulatory Visit: Payer: Self-pay | Admitting: Internal Medicine

## 2023-12-16 ENCOUNTER — Ambulatory Visit
Admission: RE | Admit: 2023-12-16 | Discharge: 2023-12-16 | Discharge status: Home / Self Care | Payer: No Typology Code available for payment source | Source: Ambulatory Visit | Attending: Internal Medicine | Admitting: Internal Medicine

## 2023-12-16 ENCOUNTER — Ambulatory Visit
Admission: RE | Admit: 2023-12-16 | Discharge: 2023-12-16 | Disposition: A | Discharge status: Home / Self Care | Payer: No Typology Code available for payment source | Source: Ambulatory Visit | Attending: Internal Medicine | Admitting: Internal Medicine

## 2023-12-16 DIAGNOSIS — N6320 Unspecified lump in the left breast, unspecified quadrant: Secondary | ICD-10-CM

## 2023-12-17 ENCOUNTER — Ambulatory Visit
Admission: RE | Admit: 2023-12-17 | Discharge: 2023-12-17 | Disposition: A | Discharge status: Home / Self Care | Payer: No Typology Code available for payment source | Source: Ambulatory Visit | Attending: Internal Medicine | Admitting: Internal Medicine

## 2023-12-17 DIAGNOSIS — D242 Benign neoplasm of left breast: Secondary | ICD-10-CM | POA: Diagnosis not present

## 2023-12-17 DIAGNOSIS — N6082 Other benign mammary dysplasias of left breast: Secondary | ICD-10-CM | POA: Diagnosis not present

## 2023-12-17 DIAGNOSIS — N6324 Unspecified lump in the left breast, lower inner quadrant: Secondary | ICD-10-CM | POA: Diagnosis not present

## 2023-12-17 DIAGNOSIS — N6320 Unspecified lump in the left breast, unspecified quadrant: Secondary | ICD-10-CM

## 2023-12-17 HISTORY — PX: BREAST BIOPSY: SHX20

## 2023-12-18 LAB — SURGICAL PATHOLOGY

## 2023-12-23 ENCOUNTER — Other Ambulatory Visit: Payer: No Typology Code available for payment source

## 2023-12-26 ENCOUNTER — Ambulatory Visit: Payer: Self-pay | Admitting: General Surgery

## 2023-12-26 DIAGNOSIS — N6092 Unspecified benign mammary dysplasia of left breast: Secondary | ICD-10-CM | POA: Diagnosis not present

## 2023-12-29 ENCOUNTER — Telehealth: Payer: Self-pay | Admitting: Plastic Surgery

## 2023-12-29 NOTE — Telephone Encounter (Signed)
Called pt and she does not want to move forward or do a consult right now, I let her know her referral was good for a year and if she changed her mind we can always sch that consult for her to meet with the ptovider

## 2024-01-02 ENCOUNTER — Other Ambulatory Visit: Payer: Self-pay | Admitting: General Surgery

## 2024-01-02 DIAGNOSIS — N6092 Unspecified benign mammary dysplasia of left breast: Secondary | ICD-10-CM

## 2024-01-05 ENCOUNTER — Ambulatory Visit (INDEPENDENT_AMBULATORY_CARE_PROVIDER_SITE_OTHER): Payer: No Typology Code available for payment source | Admitting: Otolaryngology

## 2024-01-05 VITALS — BP 143/82

## 2024-01-05 DIAGNOSIS — H6123 Impacted cerumen, bilateral: Secondary | ICD-10-CM | POA: Diagnosis not present

## 2024-01-05 NOTE — Progress Notes (Signed)
Patient ID: [name], female   DOB: 1956-02-10, 68 y.o.   MRN: [id number]  Procedure: Bilateral cerumen disimpaction.   Indication: Cerumen impaction, resulting in ear discomfort and conductive hearing loss.   Description: The patient is placed supine on the operating table. Under the operating microscope, the right ear canal is examined and is noted to be impacted with cerumen. The cerumen is carefully removed with a combination of suction catheters, cerumen curette, and alligator forceps. After the cerumen removal, the ear canal and tympanic membrane are noted to be normal. No middle ear effusion is noted. The same procedure is then repeated on the left side without exception. The patient tolerated the procedure well.  Follow-up care:  The patient is instructed not to use Q-tips to clean the ear canals. The patient will follow up in 12 months.

## 2024-01-12 NOTE — Pre-Procedure Instructions (Signed)
 Surgical Instructions   Your procedure is scheduled on January 15, 2024. Report to [facility] Main Entrance A at 8:00 A.M., then check in with the Admitting office. Any questions or running late day of surgery: call [phone]  Questions prior to your surgery date: call [phone], Monday-Friday, 8am-4pm. If you experience any cold or flu symptoms such as cough, fever, chills, shortness of breath, etc. between now and your scheduled surgery, please notify us  at the above number.     Remember:  Do not eat after midnight the night before your surgery   You may drink clear liquids until 7:00 AM the morning of your surgery.   Clear liquids allowed are: Water , Non-Citrus Juices (without pulp), Carbonated Beverages, Clear Tea (no milk, honey, etc.), Black Coffee Only (NO MILK, CREAM OR POWDERED CREAMER of any kind), and Gatorade.    Take these medicines the morning of surgery with A SIP OF WATER : carvedilol  (COREG )    May take these medicines IF NEEDED: albuterol  (PROVENTIL  HFA) inhaler - please bring inhaler with you morning of surgery montelukast  (SINGULAIR )    STOP taking your phentermine  AS SOON AS POSSIBLE prior to surgery.   One week prior to surgery, STOP taking any Aspirin  (unless otherwise instructed by your surgeon) Aleve , Naproxen , Ibuprofen, Motrin, Advil, Goody's, BC's, all herbal medications, fish oil, and non-prescription vitamins. This includes your medication: meloxicam  (MOBIC )                      Do NOT Smoke (Tobacco/Vaping) for 24 hours prior to your procedure.  If you use a CPAP at night, you may bring your mask/headgear for your overnight stay.   You will be asked to remove any contacts, glasses, piercing's, hearing aid's, dentures/partials prior to surgery. Please bring cases for these items if needed.    Patients discharged the day of surgery will not be allowed to drive home, and someone needs to stay with them for 24 hours.  SURGICAL WAITING ROOM  VISITATION Patients may have no more than 2 support people in the waiting area - these visitors may rotate.   Pre-op nurse will coordinate an appropriate time for 1 ADULT support person, who may not rotate, to accompany patient in pre-op.  Children under the age of 11 must have an adult with them who is not the patient and must remain in the main waiting area with an adult.  If the patient needs to stay at the hospital during part of their recovery, the visitor guidelines for inpatient rooms apply.  Please refer to the [facility] website for the visitor guidelines for any additional information.   If you received a COVID test during your pre-op visit  it is requested that you wear a mask when out in public, stay away from anyone that may not be feeling well and notify your surgeon if you develop symptoms. If you have been in contact with anyone that has tested positive in the last 10 days please notify you surgeon.      Pre-operative CHG Bathing Instructions   You can play a key role in reducing the risk of infection after surgery. Your skin needs to be as free of germs as possible. You can reduce the number of germs on your skin by washing with CHG (chlorhexidine  gluconate) soap before surgery. CHG is an antiseptic soap that kills germs and continues to kill germs even after washing.   DO NOT use if you have an allergy to chlorhexidine /CHG or  antibacterial soaps. If your skin becomes reddened or irritated, stop using the CHG and notify one of our RNs at [phone].              TAKE A SHOWER THE NIGHT BEFORE SURGERY AND THE DAY OF SURGERY    Please keep in mind the following:  DO NOT shave, including legs and underarms, 48 hours prior to surgery.   You may shave your face before/day of surgery.  Place clean sheets on your bed the night before surgery Use a clean washcloth (not used since being washed) for each shower. DO NOT sleep with pet's night before surgery.  CHG Shower  Instructions:  Wash your face and private area with normal soap. If you choose to wash your hair, wash first with your normal shampoo.  After you use shampoo/soap, rinse your hair and body thoroughly to remove shampoo/soap residue.  Turn the water  OFF and apply half the bottle of CHG soap to a CLEAN washcloth.  Apply CHG soap ONLY FROM YOUR NECK DOWN TO YOUR TOES (washing for 3-5 minutes)  DO NOT use CHG soap on face, private areas, open wounds, or sores.  Pay special attention to the area where your surgery is being performed.  If you are having back surgery, having someone wash your back for you may be helpful. Wait 2 minutes after CHG soap is applied, then you may rinse off the CHG soap.  Pat dry with a clean towel  Put on clean pajamas    Additional instructions for the day of surgery: DO NOT APPLY any lotions, deodorants, cologne, or perfumes.   Do not wear jewelry or makeup Do not wear nail polish, gel polish, artificial nails, or any other type of covering on natural nails (fingers and toes) Do not bring valuables to the hospital. [facility] is not responsible for valuables/personal belongings. Put on clean/comfortable clothes.  Please brush your teeth.  Ask your nurse before applying any prescription medications to the skin.

## 2024-01-13 ENCOUNTER — Other Ambulatory Visit: Payer: Self-pay

## 2024-01-13 ENCOUNTER — Other Ambulatory Visit (HOSPITAL_COMMUNITY): Payer: No Typology Code available for payment source

## 2024-01-13 ENCOUNTER — Encounter (HOSPITAL_COMMUNITY): Payer: Self-pay

## 2024-01-13 ENCOUNTER — Encounter (HOSPITAL_COMMUNITY)
Admission: RE | Admit: 2024-01-13 | Discharge: 2024-01-13 | Disposition: A | Discharge status: Home / Self Care | Payer: No Typology Code available for payment source | Source: Ambulatory Visit | Attending: General Surgery | Admitting: General Surgery

## 2024-01-13 VITALS — BP 138/78 | HR 86 | Temp 98.8°F | Resp 18 | Ht 64.0 in | Wt 264.7 lb

## 2024-01-13 DIAGNOSIS — I2510 Atherosclerotic heart disease of native coronary artery without angina pectoris: Secondary | ICD-10-CM | POA: Insufficient documentation

## 2024-01-13 DIAGNOSIS — Z01818 Encounter for other preprocedural examination: Secondary | ICD-10-CM | POA: Diagnosis not present

## 2024-01-13 HISTORY — DX: Personal history of urinary calculi: Z87.442

## 2024-01-13 HISTORY — DX: Pneumonia, unspecified organism: J18.9

## 2024-01-13 HISTORY — DX: Unspecified asthma, uncomplicated: J45.909

## 2024-01-13 LAB — BASIC METABOLIC PANEL
Anion gap: 8 (ref 5–15)
BUN: 15 mg/dL (ref 8–23)
CO2: 29 mmol/L (ref 22–32)
Calcium: 9.6 mg/dL (ref 8.9–10.3)
Chloride: 103 mmol/L (ref 98–111)
Creatinine, Ser: 0.95 mg/dL (ref 0.44–1.00)
GFR, Estimated: >60 mL/min (ref >60)
Glucose, Bld: 125 mg/dL — ABNORMAL HIGH (ref 70–99)
Potassium: 4.2 mmol/L (ref 3.5–5.1)
Sodium: 140 mmol/L (ref 135–145)

## 2024-01-13 LAB — CBC
HCT: 41.6 % (ref 36.0–46.0)
Hemoglobin: 13.4 g/dL (ref 12.0–15.0)
MCH: 29.8 pg (ref 26.0–34.0)
MCHC: 32.2 g/dL (ref 30.0–36.0)
MCV: 92.7 fL (ref 80.0–100.0)
Platelets: 231 10*3/uL (ref 150–400)
RBC: 4.49 MIL/uL (ref 3.87–5.11)
RDW: 12.7 % (ref 11.5–15.5)
WBC: 7.5 10*3/uL (ref 4.0–10.5)
nRBC: 0 % (ref 0.0–0.2)

## 2024-01-13 NOTE — Progress Notes (Addendum)
 PCP - Dr. 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 Pt went to all follow-up appointments as required and then was PRN follow-up.   PPM/ICD - Denies Device Orders - n/a Rep Notified - n/a  Chest x-ray - n/a EKG - 01/13/2024 Stress Test - Denies ECHO - 07/21/2003 - CE Cardiac Cath - 06/03/2003 - CE  Sleep Study - Denies CPAP - n/a  No DM  Last dose of GLP1 agonist- n/a GLP1 instructions: n/a  Blood Thinner Instructions: n/a Aspirin  Instructions: n/a  ERAS Protcol - Clear liquids until 0700 morning of surgery PRE-SURGERY Ensure or G2- n/a  COVID TEST- n/a   Anesthesia review: Yes. Breast seed placement and EKG review. Pt last dose of phentermine  and meloxicam  was February 3rd.  Patient denies shortness of breath, fever, cough and chest pain at PAT appointment. Pt denies any respiratory illness/infection in the last two months   All instructions explained to the patient, with a verbal understanding of the material. Patient agrees to go over the instructions while at home for a better understanding. Patient also instructed to self quarantine after being tested for COVID-19. The opportunity to ask questions was provided.

## 2024-01-14 ENCOUNTER — Ambulatory Visit
Admission: RE | Admit: 2024-01-14 | Discharge: 2024-01-14 | Disposition: A | Discharge status: Home / Self Care | Payer: No Typology Code available for payment source | Source: Ambulatory Visit | Attending: General Surgery | Admitting: General Surgery

## 2024-01-14 DIAGNOSIS — N6092 Unspecified benign mammary dysplasia of left breast: Secondary | ICD-10-CM

## 2024-01-14 DIAGNOSIS — N6082 Other benign mammary dysplasias of left breast: Secondary | ICD-10-CM | POA: Diagnosis not present

## 2024-01-14 HISTORY — PX: BREAST BIOPSY: SHX20

## 2024-01-15 ENCOUNTER — Ambulatory Visit
Admission: RE | Admit: 2024-01-15 | Discharge: 2024-01-15 | Disposition: A | Discharge status: Home / Self Care | Payer: No Typology Code available for payment source | Source: Ambulatory Visit | Attending: General Surgery | Admitting: General Surgery

## 2024-01-15 ENCOUNTER — Ambulatory Visit (HOSPITAL_BASED_OUTPATIENT_CLINIC_OR_DEPARTMENT_OTHER): Payer: No Typology Code available for payment source

## 2024-01-15 ENCOUNTER — Encounter (HOSPITAL_COMMUNITY)
Admission: RE | Disposition: A | Discharge status: Home / Self Care | Payer: Self-pay | Source: Home / Self Care | Attending: General Surgery

## 2024-01-15 ENCOUNTER — Ambulatory Visit (HOSPITAL_COMMUNITY): Payer: Self-pay | Admitting: Physician Assistant

## 2024-01-15 ENCOUNTER — Other Ambulatory Visit: Payer: Self-pay

## 2024-01-15 ENCOUNTER — Encounter (HOSPITAL_COMMUNITY): Payer: Self-pay | Admitting: General Surgery

## 2024-01-15 ENCOUNTER — Ambulatory Visit (HOSPITAL_COMMUNITY)
Admission: RE | Admit: 2024-01-15 | Discharge: 2024-01-15 | Disposition: A | Discharge status: Home / Self Care | Payer: No Typology Code available for payment source | Attending: General Surgery | Admitting: General Surgery

## 2024-01-15 DIAGNOSIS — J45909 Unspecified asthma, uncomplicated: Secondary | ICD-10-CM

## 2024-01-15 DIAGNOSIS — Z87891 Personal history of nicotine dependence: Secondary | ICD-10-CM

## 2024-01-15 DIAGNOSIS — N189 Chronic kidney disease, unspecified: Secondary | ICD-10-CM | POA: Insufficient documentation

## 2024-01-15 DIAGNOSIS — N6082 Other benign mammary dysplasias of left breast: Secondary | ICD-10-CM | POA: Diagnosis not present

## 2024-01-15 DIAGNOSIS — I10 Essential (primary) hypertension: Secondary | ICD-10-CM | POA: Diagnosis not present

## 2024-01-15 DIAGNOSIS — N6012 Diffuse cystic mastopathy of left breast: Secondary | ICD-10-CM | POA: Diagnosis not present

## 2024-01-15 DIAGNOSIS — N6092 Unspecified benign mammary dysplasia of left breast: Secondary | ICD-10-CM

## 2024-01-15 DIAGNOSIS — Z5986 Financial insecurity: Secondary | ICD-10-CM | POA: Insufficient documentation

## 2024-01-15 DIAGNOSIS — I129 Hypertensive chronic kidney disease with stage 1 through stage 4 chronic kidney disease, or unspecified chronic kidney disease: Secondary | ICD-10-CM | POA: Insufficient documentation

## 2024-01-15 DIAGNOSIS — N62 Hypertrophy of breast: Secondary | ICD-10-CM | POA: Diagnosis not present

## 2024-01-15 DIAGNOSIS — N6022 Fibroadenosis of left breast: Secondary | ICD-10-CM | POA: Diagnosis not present

## 2024-01-15 HISTORY — PX: BREAST LUMPECTOMY WITH RADIOACTIVE SEED LOCALIZATION: SHX6424

## 2024-01-15 HISTORY — PX: BREAST EXCISIONAL BIOPSY: SUR124

## 2024-01-15 SURGERY — BREAST LUMPECTOMY WITH RADIOACTIVE SEED LOCALIZATION
Anesthesia: General | Site: Breast | Laterality: Left

## 2024-01-15 MED ORDER — ONDANSETRON HCL 4 MG/2ML IJ SOLN
INTRAMUSCULAR | Status: AC
  Filled 2024-01-15: qty 2

## 2024-01-15 MED ORDER — PROPOFOL 10 MG/ML IV BOLUS
INTRAVENOUS | Status: DC | PRN
  Administered 2024-01-15: 150 mg via INTRAVENOUS

## 2024-01-15 MED ORDER — DEXAMETHASONE SODIUM PHOSPHATE 10 MG/ML IJ SOLN
INTRAMUSCULAR | Status: AC
  Filled 2024-01-15: qty 1

## 2024-01-15 MED ORDER — CHLORHEXIDINE GLUCONATE 0.12 % MT SOLN
15.0000 mL | Freq: Once | OROMUCOSAL | Status: AC
  Administered 2024-01-15: 15 mL via OROMUCOSAL
  Filled 2024-01-15: qty 15

## 2024-01-15 MED ORDER — ONDANSETRON HCL 4 MG/2ML IJ SOLN
INTRAMUSCULAR | Status: DC | PRN
  Administered 2024-01-15: 4 mg via INTRAVENOUS

## 2024-01-15 MED ORDER — EPHEDRINE 5 MG/ML INJ
INTRAVENOUS | Status: AC
  Filled 2024-01-15: qty 5

## 2024-01-15 MED ORDER — ORAL CARE MOUTH RINSE: 15.0000 mL | Freq: Once | OROMUCOSAL | Status: AC

## 2024-01-15 MED ORDER — PHENYLEPHRINE 80 MCG/ML (10ML) SYRINGE FOR IV PUSH (FOR BLOOD PRESSURE SUPPORT)
PREFILLED_SYRINGE | INTRAVENOUS | Status: AC
  Filled 2024-01-15: qty 10

## 2024-01-15 MED ORDER — DEXAMETHASONE SODIUM PHOSPHATE 10 MG/ML IJ SOLN
INTRAMUSCULAR | Status: DC | PRN
  Administered 2024-01-15: 5 mg via INTRAVENOUS

## 2024-01-15 MED ORDER — LIDOCAINE 2% (20 MG/ML) 5 ML SYRINGE
INTRAMUSCULAR | Status: DC | PRN
  Administered 2024-01-15: 100 mg via INTRAVENOUS

## 2024-01-15 MED ORDER — SUGAMMADEX SODIUM 200 MG/2ML IV SOLN
INTRAVENOUS | Status: DC | PRN
  Administered 2024-01-15: 200 mg via INTRAVENOUS

## 2024-01-15 MED ORDER — CHLORHEXIDINE GLUCONATE CLOTH 2 % EX PADS: 6.0000 | MEDICATED_PAD | Freq: Once | CUTANEOUS | Status: DC

## 2024-01-15 MED ORDER — LIDOCAINE 2% (20 MG/ML) 5 ML SYRINGE
INTRAMUSCULAR | Status: AC
Start: 2024-01-15 — End: ?
  Filled 2024-01-15: qty 5

## 2024-01-15 MED ORDER — PROPOFOL 10 MG/ML IV BOLUS
INTRAVENOUS | Status: AC
  Filled 2024-01-15: qty 20

## 2024-01-15 MED ORDER — FENTANYL CITRATE (PF) 250 MCG/5ML IJ SOLN
INTRAMUSCULAR | Status: DC | PRN
  Administered 2024-01-15: 100 ug via INTRAVENOUS

## 2024-01-15 MED ORDER — OXYCODONE HCL 5 MG PO TABS: 5.0000 mg | ORAL_TABLET | Freq: Four times a day (QID) | ORAL | 0 refills | Status: DC | PRN

## 2024-01-15 MED ORDER — LACTATED RINGERS IV SOLN: INTRAVENOUS | Status: DC

## 2024-01-15 MED ORDER — ROCURONIUM BROMIDE 10 MG/ML (PF) SYRINGE
PREFILLED_SYRINGE | INTRAVENOUS | Status: AC
  Filled 2024-01-15: qty 10

## 2024-01-15 MED ORDER — BUPIVACAINE HCL (PF) 0.25 % IJ SOLN
INTRAMUSCULAR | Status: AC
  Filled 2024-01-15: qty 30

## 2024-01-15 MED ORDER — FENTANYL CITRATE (PF) 250 MCG/5ML IJ SOLN
INTRAMUSCULAR | Status: AC
  Filled 2024-01-15: qty 5

## 2024-01-15 MED ORDER — PHENYLEPHRINE 80 MCG/ML (10ML) SYRINGE FOR IV PUSH (FOR BLOOD PRESSURE SUPPORT)
PREFILLED_SYRINGE | INTRAVENOUS | Status: DC | PRN
  Administered 2024-01-15: 80 ug via INTRAVENOUS

## 2024-01-15 MED ORDER — SUCCINYLCHOLINE CHLORIDE 200 MG/10ML IV SOSY
PREFILLED_SYRINGE | INTRAVENOUS | Status: DC | PRN
  Administered 2024-01-15: 100 mg via INTRAVENOUS

## 2024-01-15 MED ORDER — ACETAMINOPHEN 500 MG PO TABS
1000.0000 mg | ORAL_TABLET | ORAL | Status: AC
  Administered 2024-01-15: 1000 mg via ORAL
  Filled 2024-01-15: qty 2

## 2024-01-15 MED ORDER — GABAPENTIN 100 MG PO CAPS
100.0000 mg | ORAL_CAPSULE | ORAL | Status: AC
  Administered 2024-01-15: 100 mg via ORAL
  Filled 2024-01-15: qty 1

## 2024-01-15 MED ORDER — SUCCINYLCHOLINE CHLORIDE 200 MG/10ML IV SOSY
PREFILLED_SYRINGE | INTRAVENOUS | Status: AC
  Filled 2024-01-15: qty 10

## 2024-01-15 MED ORDER — PHENYLEPHRINE HCL-NACL 20-0.9 MG/250ML-% IV SOLN
INTRAVENOUS | Status: DC | PRN
  Administered 2024-01-15: 50 ug/min via INTRAVENOUS

## 2024-01-15 MED ORDER — BUPIVACAINE-EPINEPHRINE 0.25% -1:200000 IJ SOLN
INTRAMUSCULAR | Status: DC | PRN
  Administered 2024-01-15: 20 mL

## 2024-01-15 MED ORDER — CEFAZOLIN SODIUM-DEXTROSE 3-4 GM/150ML-% IV SOLN
3.0000 g | INTRAVENOUS | Status: AC
  Administered 2024-01-15: 3 g via INTRAVENOUS
  Filled 2024-01-15: qty 150

## 2024-01-15 MED ORDER — EPHEDRINE SULFATE-NACL 50-0.9 MG/10ML-% IV SOSY
PREFILLED_SYRINGE | INTRAVENOUS | Status: DC | PRN
  Administered 2024-01-15: 10 mg via INTRAVENOUS
  Administered 2024-01-15: 5 mg via INTRAVENOUS

## 2024-01-15 MED ORDER — ROCURONIUM BROMIDE 10 MG/ML (PF) SYRINGE
PREFILLED_SYRINGE | INTRAVENOUS | Status: DC | PRN
  Administered 2024-01-15: 20 mg via INTRAVENOUS

## 2024-01-15 SURGICAL SUPPLY — 30 items
APPLIER CLIP 9.375 MED OPEN (MISCELLANEOUS)
BAG COUNTER SPONGE SURGICOUNT (BAG) IMPLANT
BINDER BREAST LRG (GAUZE/BANDAGES/DRESSINGS) IMPLANT
BINDER BREAST XLRG (GAUZE/BANDAGES/DRESSINGS) IMPLANT
CANISTER SUCT 3000ML PPV (MISCELLANEOUS) ×1 IMPLANT
CHLORAPREP W/TINT 26 (MISCELLANEOUS) ×1 IMPLANT
CLIP APPLIE 9.375 MED OPEN (MISCELLANEOUS) IMPLANT
COVER PROBE W GEL 5X96 (DRAPES) ×1 IMPLANT
COVER SURGICAL LIGHT HANDLE (MISCELLANEOUS) ×1 IMPLANT
DERMABOND ADVANCED .7 DNX12 (GAUZE/BANDAGES/DRESSINGS) ×1 IMPLANT
DEVICE DUBIN SPECIMEN MAMMOGRA (MISCELLANEOUS) ×1 IMPLANT
DRAPE CHEST BREAST 15X10 FENES (DRAPES) ×1 IMPLANT
ELECT COATED BLADE 2.86 ST (ELECTRODE) ×1 IMPLANT
ELECT REM PT RETURN 9FT ADLT (ELECTROSURGICAL) ×1
ELECTRODE REM PT RTRN 9FT ADLT (ELECTROSURGICAL) ×1 IMPLANT
GLOVE BIO SURGEON STRL SZ7.5 (GLOVE) ×2 IMPLANT
GOWN STRL REUS W/ TWL LRG LVL3 (GOWN DISPOSABLE) ×2 IMPLANT
KIT BASIN OR (CUSTOM PROCEDURE TRAY) ×1 IMPLANT
KIT MARKER MARGIN INK (KITS) ×1 IMPLANT
LIGHT WAVEGUIDE WIDE FLAT (MISCELLANEOUS) IMPLANT
NDL HYPO 25GX1X1/2 BEV (NEEDLE) ×1 IMPLANT
NEEDLE HYPO 25GX1X1/2 BEV (NEEDLE) ×1
NS IRRIG 1000ML POUR BTL (IV SOLUTION) ×1 IMPLANT
PACK GENERAL/GYN (CUSTOM PROCEDURE TRAY) ×1 IMPLANT
SUT MNCRL AB 4-0 PS2 18 (SUTURE) ×1 IMPLANT
SUT SILK 2 0 SH (SUTURE) IMPLANT
SUT VIC AB 3-0 SH 18 (SUTURE) ×1 IMPLANT
SYR CONTROL 10ML LL (SYRINGE) ×1 IMPLANT
TOWEL GREEN STERILE (TOWEL DISPOSABLE) ×1 IMPLANT
TOWEL GREEN STERILE FF (TOWEL DISPOSABLE) ×1 IMPLANT

## 2024-01-15 NOTE — Anesthesia Procedure Notes (Signed)
 Procedure Name: Intubation Date/Time: 01/15/2024 10:53 AM  Performed by: Crisoforo Burnard KIDD, CRNAPre-anesthesia Checklist: Patient identified, Emergency Drugs available, Suction available, Patient being monitored and Timeout performed Patient Re-evaluated:Patient Re-evaluated prior to induction Oxygen Delivery Method: Circle system utilized Preoxygenation: Pre-oxygenation with 100% oxygen Induction Type: IV induction Ventilation: Mask ventilation with difficulty Laryngoscope Size: Mac and 4 Grade View: Grade III Tube type: Oral Tube size: 6.5 mm Airway Equipment and Method: Stylet Placement Confirmation: ETT inserted through vocal cords under direct vision, positive ETCO2 and breath sounds checked- equal and bilateral Secured at: 21 cm Tube secured with: Tape Dental Injury: Teeth and Oropharynx as per pre-operative assessment

## 2024-01-15 NOTE — Op Note (Signed)
 01/15/2024  11:12 AM  PATIENT:  [name]  68 y.o. female  PRE-OPERATIVE DIAGNOSIS:  LEFT BREASTY ATYPICAL DUCTAL HYPERPLASIA  POST-OPERATIVE DIAGNOSIS:  LEFT BREASTY ATYPICAL DUCTAL HYPERPLASIA  PROCEDURE:  Procedure(s): LEFT BREAST RADIOACTIVE SEED LOCALIZED LUMPECTOMY (Left)  SURGEON:  Surgeons and Role:    DEWAINE Curvin Deward DOUGLAS, MD - Primary  PHYSICIAN ASSISTANT:   ASSISTANTS: none   ANESTHESIA:   local and general  EBL:  25 mL   BLOOD ADMINISTERED:none  DRAINS: none   LOCAL MEDICATIONS USED:  MARCAINE      SPECIMEN:  Source of Specimen:  left breast tissue  DISPOSITION OF SPECIMEN:  PATHOLOGY  COUNTS:  YES  TOURNIQUET:  * No tourniquets in log *  DICTATION: .Dragon Dictation  After informed consent was obtained the patient was brought to the operating room and placed in the supine position on the operating table.  After adequate induction of general anesthesia the patient's left breast was prepped with ChloraPrep, allowed to dry, and draped in usual sterile manner.  An appropriate timeout was performed.  Previously an I-125 seed was placed in the lower inner quadrant of the left breast to mark an area of atypical duct hyperplasia.  The neoprobe was set to I-125 in the area of radioactivity was readily identified.  The area around this was infiltrated with quarter percent Marcaine .  A curvilinear incision was then made along the edge of the areola of the lower inner left breast.  The incision was carried through the skin and subcutaneous tissue sharply with the electrocautery.  Dissection was then carried towards the radioactive seed sharply with the electrocautery under the direction of the neoprobe.  Once I more closely approach the radioactive seed I then removed a circular portion of breast tissue sharply with the electrocautery around the radioactive seed while checking the area of radioactivity frequently.  Once the tissue was removed it was oriented with the  appropriate paint colors.  A specimen radiograph was obtained that showed the clip and seed to be near the center of the specimen.  The specimen was then sent to pathology for further evaluation.  Hemostasis was achieved using the Bovie electrocautery.  The wound was irrigated with saline and infiltrated with more quarter percent Marcaine .  The deep layer of the incision was then closed with layers of interrupted 3-0 Vicryl stitches.  The skin was then closed with interrupted 4-0 Monocryl subcuticular stitches.  Dermabond dressings were applied.  The patient tolerated the procedure well.  At the end of the case all needle sponge and instrument counts were correct.  The patient was then awakened and taken to recovery in stable condition.  PLAN OF CARE: Discharge to home after PACU  PATIENT DISPOSITION:  PACU - hemodynamically stable.   Delay start of Pharmacological VTE agent (>24hrs) due to surgical blood loss or risk of bleeding: not applicable

## 2024-01-15 NOTE — H&P (Signed)
 REFERRING PHYSICIAN: Plotnikov, Karlynn GAILS, MD PROVIDER: DEWARD GARNETTE NULL, MD MRN: [id number] DOB: 08/12/1956 Subjective   Chief Complaint: New Consultation  History of Present Illness: [name] is a 68 y.o. female who is seen today as an office consultation for evaluation of New Consultation  We are asked to see the patient in consultation by Dr. Garald to evaluate her for atypical ductal hyperplasia of the left breast. The patient is a 68 year old black female who recently went for a routine screening mammogram. At that time she was found to have a 9 mm mass in the lower inner quadrant of the left breast. This was biopsied and came back as a papilloma with atypical ductal hyperplasia. She has no family history of breast cancer. She is otherwise in good health except for some hypertension and asthma and she does not smoke.  Review of Systems: A complete review of systems was obtained from the patient. I have reviewed this information and discussed as appropriate with the patient. See HPI as well for other ROS.  ROS   Medical History: Past Medical History:  Diagnosis Date  Arthritis  Asthma, unspecified asthma severity, unspecified whether complicated, unspecified whether persistent (HHS-HCC)  Hypertension   Patient Active Problem List  Diagnosis  Atypical ductal hyperplasia of left breast   Past Surgical History:  Procedure Laterality Date  CHOLECYSTECTOMY  JOINT REPLACEMENT  KNEE ARTHROSCOPY  LAPAROSCOPIC TUBAL LIGATION    Allergies  Allergen Reactions  Hydrochlorothiazide Other (See Comments)  REACTION: cramps  Phentermine  Hcl Dizziness  REACTION: dizzy  Valsartan Other (See Comments)  REACTION: side effect- lightheadedness  Verapamil Other (See Comments)  REACTION: ? lightheaded   Current Outpatient Medications on File Prior to Visit  Medication Sig Dispense Refill  albuterol  MDI, PROVENTIL , VENTOLIN , PROAIR , HFA 90 mcg/actuation inhaler Inhale 2 Inhalations  into the lungs every 4 (four) hours as needed  carvediloL  (COREG ) 25 MG tablet Take 25 mg by mouth  FUROsemide  (LASIX ) 20 MG tablet Take 20 mg by mouth once daily  meloxicam  (MOBIC ) 7.5 MG tablet Take 1 tablet by mouth once daily as needed  montelukast  (SINGULAIR ) 10 mg tablet Take 10 mg by mouth once daily  phentermine  (ADIPEX-P ) 37.5 MG capsule TAKE 1 CAPSULE(37.5 MG) BY MOUTH EVERY MORNING   No current facility-administered medications on file prior to visit.   Family History  Problem Relation Age of Onset  High blood pressure (Hypertension) Mother  Hyperlipidemia (Elevated cholesterol) Mother  High blood pressure (Hypertension) Father  Hyperlipidemia (Elevated cholesterol) Father    Social History   Tobacco Use  Smoking Status Former  Types: Cigarettes  Start date: 2015  Smokeless Tobacco Never    Social History   Socioeconomic History  Marital status: Legally Separated  Tobacco Use  Smoking status: Former  Types: Cigarettes  Start date: 2015  Smokeless tobacco: Never  Substance and Sexual Activity  Alcohol use: Yes  Drug use: Never   Social Drivers of Corporate Investment Banker Strain: Medium Risk (11/21/2023)  Received from [facility]  Overall Financial Resource Strain (CARDIA)  Difficulty of Paying Living Expenses: Somewhat hard  Food Insecurity: No Food Insecurity (11/21/2023)  Received from [facility]  Hunger Vital Sign  Worried About Running Out of Food in the Last Year: Never true  Ran Out of Food in the Last Year: Never true  Transportation Needs: No Transportation Needs (11/21/2023)  Received from [facility] - Transportation  Lack of Transportation (Medical): No  Lack of Transportation (Non-Medical): No  Physical Activity: Sufficiently Active (11/21/2023)  Received from [facility]  Exercise Vital Sign  Days of Exercise per Week: 4 days  Minutes of Exercise per Session: 60 min  Stress: No Stress Concern Present (11/21/2023)   Received from [facility] - Occupational Stress Questionnaire  Feeling of Stress : Only a little  Social Connections: Socially Isolated (11/21/2023)  Received from [facility]  Social Connection and Isolation Panel [NHANES]  Frequency of Communication with Friends and Family: More than three times a week  Frequency of Social Gatherings with Friends and Family: Never  Attends Religious Services: Never  Database Administrator or Organizations: No  Attends Banker Meetings: Never  Marital Status: Separated  Housing Stability: Unknown (12/26/2023)  Housing Stability Vital Sign  Homeless in the Last Year: No   Objective:   Vitals:  BP: (!) 140/80  Pulse: 97  Temp: 36.6 C (97.9 F)  SpO2: 97%  Weight: (!) 120.7 kg (266 lb)  Height: 162.6 cm (5' 4)  PainSc: 0-No pain  PainLoc: Breast   Body mass index is 45.66 kg/m.  Physical Exam Vitals reviewed.  Constitutional:  General: She is not in acute distress. Appearance: Normal appearance.  HENT:  Head: Normocephalic and atraumatic.  Right Ear: External ear normal.  Left Ear: External ear normal.  Nose: Nose normal.  Mouth/Throat:  Mouth: Mucous membranes are moist.  Pharynx: Oropharynx is clear.  Eyes:  General: No scleral icterus. Extraocular Movements: Extraocular movements intact.  Conjunctiva/sclera: Conjunctivae normal.  Pupils: Pupils are equal, round, and reactive to light.  Cardiovascular:  Rate and Rhythm: Normal rate and regular rhythm.  Pulses: Normal pulses.  Heart sounds: Normal heart sounds.  Pulmonary:  Effort: Pulmonary effort is normal. No respiratory distress.  Breath sounds: Normal breath sounds.  Abdominal:  General: Bowel sounds are normal.  Palpations: Abdomen is soft.  Tenderness: There is no abdominal tenderness.  Musculoskeletal:  General: No swelling, tenderness or deformity. Normal range of motion.  Cervical back: Normal range of  motion and neck supple.  Skin: General: Skin is warm and dry.  Coloration: Skin is not jaundiced.  Neurological:  General: No focal deficit present.  Mental Status: She is alert and oriented to person, place, and time.  Psychiatric:  Mood and Affect: Mood normal.  Behavior: Behavior normal.     Breast: There is no palpable mass in either breast. There is no palpable axillary, supraclavicular, or cervical lymphadenopathy.  Labs, Imaging and Diagnostic Testing:  Assessment and Plan:   Diagnoses and all orders for this visit:  Atypical ductal hyperplasia of left breast - Ambulatory Referral to Plastic Surgery   The patient appears to have a 9 mm area of atypical ductal hyperplasia in the lower inner quadrant of the left breast. This is considered a high risk lesion. This does increase her lifetime risk of breast cancer to approximately 25%. Because atypical ductal hyperplasia can have an appearance similar to preinvasive cancer my recommendation would be to have this area removed. I have discussed with her in detail the risks and benefits of the operation as well as some of the technical aspects including use of a radioactive seed for localization and she understands and wishes to proceed. We will also refer her to the high risk clinic at the cancer center to talk about risk reduction. She also has very large breasts with neck and back pain so I will refer her to plastic surgery to see if she  would be a candidate for reduction at the same time as her lumpectomy. Once she sees plastics we can begin surgical scheduling

## 2024-01-15 NOTE — Anesthesia Postprocedure Evaluation (Signed)
 Anesthesia Post Note  Patient: [name]  Procedure(s) Performed: LEFT BREAST RADIOACTIVE SEED LOCALIZED LUMPECTOMY (Left: Breast)     Patient location during evaluation: PACU Anesthesia Type: General Level of consciousness: awake and alert Pain management: pain level controlled Vital Signs Assessment: post-procedure vital signs reviewed and stable Respiratory status: spontaneous breathing, nonlabored ventilation, respiratory function stable and patient connected to nasal cannula oxygen Cardiovascular status: blood pressure returned to baseline and stable Postop Assessment: no apparent nausea or vomiting Anesthetic complications: no   No notable events documented.  Last Vitals:  Vitals:   01/15/24 1145 01/15/24 1200  BP: 135/68 (!) 144/77  Pulse: 69 68  Resp: 13 15  Temp:  36.7 C  SpO2: 98% 99%    Last Pain:  Vitals:   01/15/24 1200  TempSrc:   PainSc: 0-No pain                 Thom JONELLE Peoples

## 2024-01-15 NOTE — Transfer of Care (Signed)
 Immediate Anesthesia Transfer of Care Note  Patient: [name]  Procedure(s) Performed: LEFT BREAST RADIOACTIVE SEED LOCALIZED LUMPECTOMY (Left: Breast)  Patient Location: PACU  Anesthesia Type:General  Level of Consciousness: awake, alert , and oriented  Airway & Oxygen Therapy: Patient Spontanous Breathing  Post-op Assessment: Report given to RN  Post vital signs: Reviewed and stable  Last Vitals:  Vitals Value Taken Time  BP 123/65 01/15/24 1130  Temp 36.5 C 01/15/24 1130  Pulse 73 01/15/24 1130  Resp 16 01/15/24 1130  SpO2 100 % 01/15/24 1130    Last Pain:  Vitals:   01/15/24 1130  TempSrc:   PainSc: 0-No pain         Complications: No notable events documented.

## 2024-01-15 NOTE — Anesthesia Preprocedure Evaluation (Signed)
 Anesthesia Evaluation  Patient identified by MRN, date of birth, ID band Patient awake    Reviewed: Allergy & Precautions, H&P , NPO status , Patient's Chart, lab work & pertinent test results  Airway Mallampati: II  TM Distance: >3 FB Neck ROM: Full    Dental no notable dental hx.    Pulmonary asthma , former smoker   Pulmonary exam normal breath sounds clear to auscultation       Cardiovascular hypertension, Normal cardiovascular exam Rhythm:Regular Rate:Normal     Neuro/Psych  Headaches  negative psych ROS   GI/Hepatic Neg liver ROS,GERD  ,,  Endo/Other  negative endocrine ROS    Renal/GU CRFRenal disease  negative genitourinary   Musculoskeletal  (+) Arthritis ,    Abdominal   Peds negative pediatric ROS (+)  Hematology  (+) Blood dyscrasia, anemia   Anesthesia Other Findings   Reproductive/Obstetrics negative OB ROS                             Anesthesia Physical Anesthesia Plan  ASA: 3  Anesthesia Plan: General   Post-op Pain Management: Tylenol  PO (pre-op)*   Induction: Intravenous  PONV Risk Score and Plan: 3 and Ondansetron , Dexamethasone  and Treatment may vary due to age or medical condition  Airway Management Planned: Oral ETT  Additional Equipment:   Intra-op Plan:   Post-operative Plan: Extubation in OR  Informed Consent: I have reviewed the patients History and Physical, chart, labs and discussed the procedure including the risks, benefits and alternatives for the proposed anesthesia with the patient or authorized representative who has indicated his/her understanding and acceptance.     Dental advisory given  Plan Discussed with: CRNA  Anesthesia Plan Comments:        Anesthesia Quick Evaluation

## 2024-01-15 NOTE — Interval H&P Note (Signed)
 History and Physical Interval Note:  01/15/2024 8:57 AM  [name]  has presented today for surgery, with the diagnosis of LEFT BREASTY ATYPICAL DUCTAL HYPERPLASIA.  The various methods of treatment have been discussed with the patient and family. After consideration of risks, benefits and other options for treatment, the patient has consented to  Procedure(s): LEFT BREAST RADIOACTIVE SEED LOCALIZED LUMPECTOMY (Left) as a surgical intervention.  The patient's history has been reviewed, patient examined, no change in status, stable for surgery.  I have reviewed the patient's chart and labs.  Questions were answered to the patient's satisfaction.     [name]

## 2024-01-16 ENCOUNTER — Encounter (HOSPITAL_COMMUNITY): Payer: Self-pay | Admitting: General Surgery

## 2024-01-19 LAB — SURGICAL PATHOLOGY

## 2024-01-21 ENCOUNTER — Encounter: Payer: Self-pay | Admitting: General Surgery

## 2024-02-09 ENCOUNTER — Other Ambulatory Visit: Payer: Self-pay | Admitting: Internal Medicine

## 2024-03-01 ENCOUNTER — Other Ambulatory Visit (INDEPENDENT_AMBULATORY_CARE_PROVIDER_SITE_OTHER)

## 2024-03-01 DIAGNOSIS — Z0000 Encounter for general adult medical examination without abnormal findings: Secondary | ICD-10-CM | POA: Diagnosis not present

## 2024-03-01 DIAGNOSIS — R739 Hyperglycemia, unspecified: Secondary | ICD-10-CM

## 2024-03-01 LAB — LIPID PANEL
Cholesterol: 177 mg/dL (ref 0–200)
HDL: 41.6 mg/dL
LDL Cholesterol: 115 mg/dL — ABNORMAL HIGH (ref 0–99)
NonHDL: 135.44
Total CHOL/HDL Ratio: 4
Triglycerides: 104 mg/dL (ref 0.0–149.0)
VLDL: 20.8 mg/dL (ref 0.0–40.0)

## 2024-03-01 LAB — CBC WITH DIFFERENTIAL/PLATELET
Basophils Absolute: 0 10*3/uL (ref 0.0–0.1)
Basophils Relative: 0.7 % (ref 0.0–3.0)
Eosinophils Absolute: 0.3 10*3/uL (ref 0.0–0.7)
Eosinophils Relative: 3.9 % (ref 0.0–5.0)
HCT: 40.5 % (ref 36.0–46.0)
Hemoglobin: 13.4 g/dL (ref 12.0–15.0)
Lymphocytes Relative: 21.1 % (ref 12.0–46.0)
Lymphs Abs: 1.4 10*3/uL (ref 0.7–4.0)
MCHC: 33.2 g/dL (ref 30.0–36.0)
MCV: 91.8 fl (ref 78.0–100.0)
Monocytes Absolute: 0.7 10*3/uL (ref 0.1–1.0)
Monocytes Relative: 10.2 % (ref 3.0–12.0)
Neutro Abs: 4.3 10*3/uL (ref 1.4–7.7)
Neutrophils Relative %: 64.1 % (ref 43.0–77.0)
Platelets: 238 10*3/uL (ref 150.0–400.0)
RBC: 4.41 Mil/uL (ref 3.87–5.11)
RDW: 13.4 % (ref 11.5–15.5)
WBC: 6.8 10*3/uL (ref 4.0–10.5)

## 2024-03-01 LAB — URINALYSIS
Bilirubin Urine: NEGATIVE
Hgb urine dipstick: NEGATIVE
Ketones, ur: NEGATIVE
Leukocytes,Ua: NEGATIVE
Nitrite: NEGATIVE
Specific Gravity, Urine: 1.02 (ref 1.000–1.030)
Total Protein, Urine: NEGATIVE
Urine Glucose: NEGATIVE
Urobilinogen, UA: 0.2 (ref 0.0–1.0)
pH: 6 (ref 5.0–8.0)

## 2024-03-01 LAB — COMPREHENSIVE METABOLIC PANEL
ALT: 16 U/L (ref 0–35)
AST: 14 U/L (ref 0–37)
Albumin: 4.3 g/dL (ref 3.5–5.2)
Alkaline Phosphatase: 69 U/L (ref 39–117)
BUN: 14 mg/dL (ref 6–23)
CO2: 32 meq/L (ref 19–32)
Calcium: 9.6 mg/dL (ref 8.4–10.5)
Chloride: 102 meq/L (ref 96–112)
Creatinine, Ser: 1.05 mg/dL (ref 0.40–1.20)
GFR: 54.38 mL/min — ABNORMAL LOW (ref >60.00)
Glucose, Bld: 116 mg/dL — ABNORMAL HIGH (ref 70–99)
Potassium: 4.3 meq/L (ref 3.5–5.1)
Sodium: 142 meq/L (ref 135–145)
Total Bilirubin: 0.5 mg/dL (ref 0.2–1.2)
Total Protein: 7.3 g/dL (ref 6.0–8.3)

## 2024-03-01 LAB — TSH: TSH: 0.71 u[IU]/mL (ref 0.35–5.50)

## 2024-03-01 LAB — HEMOGLOBIN A1C: Hgb A1c MFr Bld: 6.3 % (ref 4.6–6.5)

## 2024-03-02 ENCOUNTER — Encounter: Payer: Self-pay | Admitting: Internal Medicine

## 2024-06-18 DIAGNOSIS — N1831 Chronic kidney disease, stage 3a: Secondary | ICD-10-CM | POA: Diagnosis not present

## 2024-06-18 DIAGNOSIS — J4540 Moderate persistent asthma, uncomplicated: Secondary | ICD-10-CM | POA: Diagnosis not present

## 2024-06-18 DIAGNOSIS — Z6841 Body Mass Index (BMI) 40.0 and over, adult: Secondary | ICD-10-CM | POA: Diagnosis not present

## 2024-06-18 DIAGNOSIS — I129 Hypertensive chronic kidney disease with stage 1 through stage 4 chronic kidney disease, or unspecified chronic kidney disease: Secondary | ICD-10-CM | POA: Diagnosis not present

## 2024-06-18 DIAGNOSIS — Z008 Encounter for other general examination: Secondary | ICD-10-CM | POA: Diagnosis not present

## 2024-06-18 DIAGNOSIS — F4320 Adjustment disorder, unspecified: Secondary | ICD-10-CM | POA: Diagnosis not present

## 2024-06-18 DIAGNOSIS — R7303 Prediabetes: Secondary | ICD-10-CM | POA: Diagnosis not present

## 2024-06-21 ENCOUNTER — Telehealth: Payer: Self-pay | Admitting: Internal Medicine

## 2024-06-21 NOTE — Telephone Encounter (Signed)
 Copied from CRM [id number]. Topic: General - Other >> Jun 21, 2024  9:46 AM Lavanda D wrote: Reason for CRM: Princess with [facility] calling to see if the fax regarding hypertension disease management has been received. Confirmed correct fax #. Callback #: [phone] Ext: [phone]

## 2024-07-01 ENCOUNTER — Telehealth: Payer: Self-pay | Admitting: Internal Medicine

## 2024-07-01 NOTE — Telephone Encounter (Signed)
 Medication not  currently on profile, last prescribed 2015 Last OV:11/26/2023

## 2024-07-01 NOTE — Telephone Encounter (Unsigned)
 Copied from CRM [id number]. Topic: Clinical - Medication Refill >> Jul 01, 2024  8:27 AM [place] M wrote: Medication: triamcinolone  cream (KENALOG ) 0.5 %  Has the patient contacted their pharmacy? Yes (Agent: If no, request that the patient contact the pharmacy for the refill. If patient does not wish to contact the pharmacy document the reason why and proceed with request.) (Agent: If yes, when and what did the pharmacy advise?)  This is the patient's preferred pharmacy:  [id number] - [place], [place] - [address] AT [address] [facility] [address]  [place] [address] Phone: [phone] Fax: [phone]  Is this the correct pharmacy for this prescription? Yes If no, delete pharmacy and type the correct one.   Has the prescription been filled recently? Yes  Is the patient out of the medication? Yes  Has the patient been seen for an appointment in the last year OR does the patient have an upcoming appointment? Yes  Can we respond through MyChart? Yes  Agent: Please be advised that Rx refills may take up to 3 business days. We ask that you follow-up with your pharmacy.

## 2024-07-02 ENCOUNTER — Other Ambulatory Visit: Payer: Self-pay

## 2024-07-02 MED ORDER — TRIAMCINOLONE ACETONIDE 0.5 % EX CREA: 1.0000 | TOPICAL_CREAM | Freq: Three times a day (TID) | CUTANEOUS | 0 refills | Status: AC

## 2024-07-20 ENCOUNTER — Telehealth: Admitting: Internal Medicine

## 2024-07-20 ENCOUNTER — Encounter: Payer: Self-pay | Admitting: Internal Medicine

## 2024-07-20 ENCOUNTER — Ambulatory Visit: Payer: Self-pay

## 2024-07-20 DIAGNOSIS — R059 Cough, unspecified: Secondary | ICD-10-CM | POA: Insufficient documentation

## 2024-07-20 DIAGNOSIS — E559 Vitamin D deficiency, unspecified: Secondary | ICD-10-CM

## 2024-07-20 DIAGNOSIS — R051 Acute cough: Secondary | ICD-10-CM

## 2024-07-20 DIAGNOSIS — R062 Wheezing: Secondary | ICD-10-CM | POA: Insufficient documentation

## 2024-07-20 MED ORDER — HYDROCODONE BIT-HOMATROP MBR 5-1.5 MG/5ML PO SOLN
5.0000 mL | Freq: Four times a day (QID) | ORAL | 0 refills | Status: DC | PRN
Start: 2024-07-20 — End: 2024-08-02

## 2024-07-20 MED ORDER — MONTELUKAST SODIUM 10 MG PO TABS: ORAL_TABLET | ORAL | 3 refills | Status: DC

## 2024-07-20 MED ORDER — AMOXICILLIN-POT CLAVULANATE 875-125 MG PO TABS
1.0000 | ORAL_TABLET | Freq: Two times a day (BID) | ORAL | 0 refills | Status: DC
Start: 2024-07-20 — End: 2024-08-02

## 2024-07-20 MED ORDER — PREDNISONE 10 MG PO TABS
ORAL_TABLET | ORAL | 0 refills | Status: DC
Start: 2024-07-20 — End: 2024-08-02

## 2024-07-20 MED ORDER — FUROSEMIDE 20 MG PO TABS: 20.0000 mg | ORAL_TABLET | Freq: Every day | ORAL | 3 refills | Status: DC

## 2024-07-20 NOTE — Progress Notes (Signed)
 Patient ID: [name], female   DOB: 03/14/1956, 68 y.o.   MRN: [id number]  Virtual Visit via Video Note  I connected with [name] on 07/20/24 at  2:20 PM EDT by a video enabled telemedicine application and verified that I am speaking with the correct person using two identifiers.  Location of all participants today Patient: at home Provider: at office   I discussed the limitations of evaluation and management by telemedicine and the availability of in person appointments. The patient expressed understanding and agreed to proceed.  History of Present Illness: Here with acute onset mild to mod 2-3 days ST, HA, general weakness and malaise, with prod cough greenish sputum, but Pt denies chest pain, orthopnea, PND, increased LE swelling, palpitations, dizziness or syncope but has mild wheezing sob x 1 day   Pt denies polydipsia, polyuria, or new focal neuro s/s.    Past Medical History:  Diagnosis Date   Abnormal glucose 2009   Allergic rhinitis    Asthma    Back pain    Dyspnea    Food allergy    GERD (gastroesophageal reflux disease)    Headache    Migraines (rare). Has not had any in years as of 2025   History of kidney stones    HTN (hypertension)    Lactose intolerance    Leg edema    Osteoarthritis    Right Hip - SEVERE   Pneumonia    Past Surgical History:  Procedure Laterality Date   BREAST BIOPSY Left 12/17/2023   MM LT BREAST BX W LOC DEV 1ST LESION IMAGE BX SPEC STEREO GUIDE 12/17/2023 [facility]   BREAST BIOPSY  01/14/2024   MM LT RADIOACTIVE SEED LOC MAMMO GUIDE 01/14/2024 [facility]   BREAST LUMPECTOMY WITH RADIOACTIVE SEED LOCALIZATION Left 01/15/2024   Procedure: LEFT BREAST RADIOACTIVE SEED LOCALIZED LUMPECTOMY;  Surgeon: Curvin Deward MOULD, MD;  Location: [facility] OR;  Service: General;  Laterality: Left;   CARDIAC CATHETERIZATION  2004   [facility] at [facility]   CHOLECYSTECTOMY     COLONOSCOPY  2014   HYSTEROSCOPY WITH D & C N/A  04/04/2016   Procedure: DILATATION AND CURETTAGE /HYSTEROSCOPY, POLYPECTOMY WITH MYOSURE;  Surgeon: Oneil FORBES Piety, MD;  Location: [facility];  Service: Gynecology;  Laterality: N/A;   KNEE ARTHROSCOPY     left    TOTAL HIP ARTHROPLASTY Right 07/20/2013   Procedure: RIGHT TOTAL HIP ARTHROPLASTY ANTERIOR APPROACH;  Surgeon: Donnice JONETTA Car, MD;  Location: [facility] ORS;  Service: Orthopedics;  Laterality: Right;   TUBAL LIGATION     WISDOM TOOTH EXTRACTION      reports that she quit smoking about 12 years ago. Her smoking use included cigarettes. She started smoking about 52 years ago. She has a 20 pack-year smoking history. She has never used smokeless tobacco. She reports current alcohol use of about 7.0 standard drinks of alcohol per week. She reports that she does not use drugs. family history includes Hyperlipidemia in her mother; Hypertension in her mother and unknown relative; Parkinsonism in her mother. Allergies  Allergen Reactions   Calan [Verapamil] Other (See Comments)    ? lightheaded   Citric Acid Other (See Comments)    Flares eczema   Diovan [Valsartan] Other (See Comments)    side effect- lightheadedness   Hydrochlorothiazide Other (See Comments)    cramps   Phentermine  Hcl Other (See Comments)    Dizziness (patient is tolerating now w/o issue)   Current  Outpatient Medications on File Prior to Visit  Medication Sig Dispense Refill   albuterol  (PROVENTIL  HFA) 108 (90 Base) MCG/ACT inhaler Inhale 2 puffs into the lungs every 4 (four) hours as needed for wheezing or shortness of breath. 8.5 g 11   Ascorbic Acid (VITAMIN C PO) Take 1 tablet by mouth daily.     carvedilol  (COREG ) 25 MG tablet TAKE 1 TABLET(25 MG) BY MOUTH TWICE DAILY WITH A MEAL 180 tablet 3   Cholecalciferol (VITAMIN D3 PO) Take 1 tablet by mouth daily.     Cyanocobalamin (VITAMIN B12 PO) Take 1 tablet by mouth daily.     GINSENG PO Take 1 tablet by mouth daily.     meloxicam  (MOBIC ) 7.5 MG tablet TAKE 1 TABLET BY  MOUTH EVERY DAY AS NEEDED FOR PAIN 90 tablet 0   triamcinolone  cream (KENALOG ) 0.5 % Apply 1 Application topically 3 (three) times daily. Must keep 11/28/21 appt for future refills 120 g 0   VITAMIN E PO Take 1 capsule by mouth daily.     No current facility-administered medications on file prior to visit.    Observations/Objective: Alert, mild ill, appropriate mood and affect, resps normal, cn 2-12 intact, moves all 4s, no visible rash but has mild bilateral maxillary area swelling Lab Results  Component Value Date   WBC 6.8 03/01/2024   HGB 13.4 03/01/2024   HCT 40.5 03/01/2024   PLT 238.0 03/01/2024   GLUCOSE 116 (H) 03/01/2024   CHOL 177 03/01/2024   TRIG 104.0 03/01/2024   HDL 41.60 03/01/2024   LDLCALC 115 (H) 03/01/2024   ALT 16 03/01/2024   AST 14 03/01/2024   NA 142 03/01/2024   K 4.3 03/01/2024   CL 102 03/01/2024   CREATININE 1.05 03/01/2024   BUN 14 03/01/2024   CO2 32 03/01/2024   TSH 0.71 03/01/2024   INR 1.09 07/13/2013   HGBA1C 6.3 03/01/2024   Assessment and Plan: See notes  Follow Up Instructions: See notes   I discussed the assessment and treatment plan with the patient. The patient was provided an opportunity to ask questions and all were answered. The patient agreed with the plan and demonstrated an understanding of the instructions.   The patient was advised to call back or seek an in-person evaluation if the symptoms worsen or if the condition fails to improve as anticipated.   Lynwood Rush, MD

## 2024-07-20 NOTE — Assessment & Plan Note (Signed)
 Last vitamin D  Lab Results  Component Value Date   VD25OH 57.22 12/29/2019   Stable, cont oral replacement

## 2024-07-20 NOTE — Assessment & Plan Note (Signed)
 Mild, for cont'd inhaler prn, and prednisone  taper,  to f/u any worsening symptoms or concerns

## 2024-07-20 NOTE — Telephone Encounter (Signed)
 FYI Only or Action Required?: FYI only for provider.  Patient was last seen in primary care on 11/26/2023 by Plotnikov, Karlynn GAILS, MD.  Called Nurse Triage reporting Cough.  Symptoms began Saturday.  Interventions attempted: Nothing.  Symptoms are: gradually worsening.  Triage Disposition: See PCP When Office is Open (Within 3 Days)  Patient/caregiver understands and will follow disposition?: Yes    Copied from CRM #[id number]. Topic: Clinical - Red Word Triage >> Jul 20, 2024  1:51 PM [place] A wrote: [facility] that prompted transfer to Nurse Triage: Patient has been having cold symptoms, has headaches, body aches. Reason for Disposition  Cough has been present for > 3 weeks  Answer Assessment - Initial Assessment Questions 1. ONSET: When did the cough begin?      Saturday 2. SEVERITY: How bad is the cough today?      Moderate  3. SPUTUM: Describe the color of your sputum (e.g., none, dry cough; clear, white, yellow, green)     none 4. HEMOPTYSIS: Are you coughing up any blood? If Yes, ask: How much? (e.g., flecks, streaks, tablespoons, etc.)     na 5. DIFFICULTY BREATHING: Are you having difficulty breathing? If Yes, ask: How bad is it? (e.g., mild, moderate, severe)      Wheezing & using inhaler 6. FEVER: Do you have a fever? If Yes, ask: What is your temperature, how was it measured, and when did it start?     no 7. CARDIAC HISTORY: Do you have any history of heart disease? (e.g., heart attack, congestive heart failure)      na 8. LUNG HISTORY: Do you have any history of lung disease?  (e.g., pulmonary embolus, asthma, emphysema)     na 9. PE RISK FACTORS: Do you have a history of blood clots? (or: recent major surgery, recent prolonged travel, bedridden)     na 10. OTHER SYMPTOMS: Do you have any other symptoms? (e.g., runny nose, wheezing, chest pain)       Cold/ hot flashes, cough, scratchy throat, headache, body aches, runny nose 11.  PREGNANCY: Is there any chance you are pregnant? When was your last menstrual period?       na 12. TRAVEL: Have you traveled out of the country in the last month? (e.g., travel history, exposures)       na  Protocols used: Cough - Acute Non-Productive-A-AH

## 2024-07-20 NOTE — Patient Instructions (Signed)
 Please take all new medication as prescribed

## 2024-07-20 NOTE — Assessment & Plan Note (Signed)
 Mild to mod, c/w bronchitis vs pna, decliens cxr, for antibx course augmentin  875 mg bid course, hycodan prn,   to f/u any worsening symptoms or concerns

## 2024-07-27 ENCOUNTER — Other Ambulatory Visit (HOSPITAL_BASED_OUTPATIENT_CLINIC_OR_DEPARTMENT_OTHER)

## 2024-07-27 ENCOUNTER — Other Ambulatory Visit: Payer: No Typology Code available for payment source

## 2024-07-27 ENCOUNTER — Ambulatory Visit (HOSPITAL_BASED_OUTPATIENT_CLINIC_OR_DEPARTMENT_OTHER)
Admission: RE | Admit: 2024-07-27 | Discharge: 2024-07-27 | Disposition: A | Discharge status: Home / Self Care | Source: Ambulatory Visit | Attending: Internal Medicine | Admitting: Internal Medicine

## 2024-07-27 DIAGNOSIS — Z780 Asymptomatic menopausal state: Secondary | ICD-10-CM | POA: Diagnosis not present

## 2024-08-02 ENCOUNTER — Telehealth: Payer: Self-pay | Admitting: Internal Medicine

## 2024-08-02 ENCOUNTER — Encounter: Payer: Self-pay | Admitting: Internal Medicine

## 2024-08-02 ENCOUNTER — Ambulatory Visit: Admitting: Internal Medicine

## 2024-08-02 VITALS — BP 148/74 | HR 81 | Temp 98.3°F | Ht 64.0 in | Wt 272.0 lb

## 2024-08-02 DIAGNOSIS — R051 Acute cough: Secondary | ICD-10-CM

## 2024-08-02 DIAGNOSIS — R490 Dysphonia: Secondary | ICD-10-CM | POA: Diagnosis not present

## 2024-08-02 MED ORDER — ALBUTEROL SULFATE HFA 108 (90 BASE) MCG/ACT IN AERS: 2.0000 | INHALATION_SPRAY | RESPIRATORY_TRACT | 11 refills | Status: AC | PRN

## 2024-08-02 MED ORDER — HYDROCODONE BIT-HOMATROP MBR 5-1.5 MG/5ML PO SOLN: 5.0000 mL | Freq: Four times a day (QID) | ORAL | 0 refills | Status: AC | PRN

## 2024-08-02 MED ORDER — CARVEDILOL 25 MG PO TABS: 25.0000 mg | ORAL_TABLET | Freq: Two times a day (BID) | ORAL | 3 refills | Status: DC

## 2024-08-02 MED ORDER — DM-GUAIFENESIN ER 30-600 MG PO TB12
1.0000 | ORAL_TABLET | Freq: Two times a day (BID) | ORAL | Status: AC | PRN
Start: 2024-08-02 — End: ?

## 2024-08-02 NOTE — Telephone Encounter (Signed)
 PT wants to go over bone density results Pls call back @ [phone] or mychart

## 2024-08-02 NOTE — Assessment & Plan Note (Addendum)
 post - URI cough, losing voice: pt took abx, steroids Mucinex  DM Cough syr was renewed

## 2024-08-02 NOTE — Progress Notes (Signed)
 Subjective:  Patient ID: [name], female    DOB: 08-02-1956  Age: 68 y.o. MRN: [id number]  CC: Medical Management of Chronic Issues   HPI MADEEHA COSTANTINO presents for post - URI cough, losing voice: pt took abx, steroids F/u HTN, asthma  Outpatient Medications Prior to Visit  Medication Sig Dispense Refill   Ascorbic Acid (VITAMIN C PO) Take 1 tablet by mouth daily.     Cholecalciferol (VITAMIN D3 PO) Take 1 tablet by mouth daily.     Cyanocobalamin (VITAMIN B12 PO) Take 1 tablet by mouth daily.     furosemide  (LASIX ) 20 MG tablet Take 1 tablet (20 mg total) by mouth daily. 90 tablet 3   GINSENG PO Take 1 tablet by mouth daily.     montelukast  (SINGULAIR ) 10 MG tablet TAKE 1 TABLET(10 MG) BY MOUTH DAILY 90 tablet 3   triamcinolone  cream (KENALOG ) 0.5 % Apply 1 Application topically 3 (three) times daily. Must keep 11/28/21 appt for future refills 120 g 0   VITAMIN E PO Take 1 capsule by mouth daily.     albuterol  (PROVENTIL  HFA) 108 (90 Base) MCG/ACT inhaler Inhale 2 puffs into the lungs every 4 (four) hours as needed for wheezing or shortness of breath. 8.5 g 11   amoxicillin -clavulanate (AUGMENTIN ) 875-125 MG tablet Take 1 tablet by mouth 2 (two) times daily. 20 tablet 0   carvedilol  (COREG ) 25 MG tablet TAKE 1 TABLET(25 MG) BY MOUTH TWICE DAILY WITH A MEAL 180 tablet 3   predniSONE  (DELTASONE ) 10 MG tablet 3 tabs by mouth per day for 3 days,2tabs per day for 3 days,1tab per day for 3 days 18 tablet 0   meloxicam  (MOBIC ) 7.5 MG tablet TAKE 1 TABLET BY MOUTH EVERY DAY AS NEEDED FOR PAIN 90 tablet 0   No facility-administered medications prior to visit.    ROS: Review of Systems  Constitutional:  Negative for activity change, appetite change, chills, fatigue and unexpected weight change.  HENT:  Negative for congestion, mouth sores and sinus pressure.   Eyes:  Negative for visual disturbance.  Respiratory:  Positive for cough. Negative for chest tightness.    Gastrointestinal:  Negative for abdominal pain and nausea.  Genitourinary:  Negative for difficulty urinating, frequency and vaginal pain.  Musculoskeletal:  Negative for back pain and gait problem.  Skin:  Negative for pallor and rash.  Neurological:  Negative for dizziness, tremors, weakness, numbness and headaches.  Psychiatric/Behavioral:  Negative for confusion and sleep disturbance.     Objective:  BP (!) 148/74   Pulse 81   Temp 98.3 F (36.8 C) (Oral)   Ht 5' 4 (1.626 m)   Wt 272 lb (123.4 kg)   SpO2 97%   BMI 46.69 kg/m   BP Readings from Last 3 Encounters:  08/02/24 (!) 148/74  01/15/24 (!) 144/77  01/13/24 138/78    Wt Readings from Last 3 Encounters:  08/02/24 272 lb (123.4 kg)  01/15/24 264 lb (119.7 kg)  01/13/24 264 lb 11.2 oz (120.1 kg)    Physical Exam Constitutional:      General: She is not in acute distress.    Appearance: She is well-developed.  HENT:     Head: Normocephalic.     Right Ear: External ear normal.     Left Ear: External ear normal.     Nose: Nose normal.  Eyes:     General:        Right eye: No discharge.  Left eye: No discharge.     Conjunctiva/sclera: Conjunctivae normal.     Pupils: Pupils are equal, round, and reactive to light.  Neck:     Thyroid : No thyromegaly.     Vascular: No JVD.     Trachea: No tracheal deviation.  Cardiovascular:     Rate and Rhythm: Normal rate and regular rhythm.     Heart sounds: Normal heart sounds.  Pulmonary:     Effort: No respiratory distress.     Breath sounds: No stridor. No wheezing.  Abdominal:     General: Bowel sounds are normal. There is no distension.     Palpations: Abdomen is soft. There is no mass.     Tenderness: There is no abdominal tenderness. There is no guarding or rebound.  Musculoskeletal:        General: No tenderness.     Cervical back: Normal range of motion and neck supple. No rigidity.  Lymphadenopathy:     Cervical: No cervical adenopathy.  Skin:     Findings: No erythema or rash.  Neurological:     Cranial Nerves: No cranial nerve deficit.     Motor: No abnormal muscle tone.     Coordination: Coordination normal.     Deep Tendon Reflexes: Reflexes normal.  Psychiatric:        Behavior: Behavior normal.        Thought Content: Thought content normal.        Judgment: Judgment normal.   hoarse  Lab Results  Component Value Date   WBC 6.8 03/01/2024   HGB 13.4 03/01/2024   HCT 40.5 03/01/2024   PLT 238.0 03/01/2024   GLUCOSE 116 (H) 03/01/2024   CHOL 177 03/01/2024   TRIG 104.0 03/01/2024   HDL 41.60 03/01/2024   LDLCALC 115 (H) 03/01/2024   ALT 16 03/01/2024   AST 14 03/01/2024   NA 142 03/01/2024   K 4.3 03/01/2024   CL 102 03/01/2024   CREATININE 1.05 03/01/2024   BUN 14 03/01/2024   CO2 32 03/01/2024   TSH 0.71 03/01/2024   INR 1.09 07/13/2013   HGBA1C 6.3 03/01/2024    DG Bone Density Result Date: 07/27/2024 EXAM: DUAL X-RAY ABSORPTIOMETRY (DXA) FOR BONE MINERAL DENSITY 07/27/2024 10:18 am CLINICAL DATA:  68 year old Female Postmenopausal. TECHNIQUE: An axial (e.g., hips, spine) and/or appendicular (e.g., radius) exam was performed, as appropriate, using GE Secretary/administrator at [employer]. Images are obtained for bone mineral density measurement and are not obtained for diagnostic purposes. [id number] Exclusions: Lumbar spine due to degenerative changes; right hip due to surgical hardware. COMPARISON:  None. FINDINGS: Scan quality: Good. LEFT FEMORAL NECK: BMD (in g/cm2): 1.183 T-score: 1.0 Z-score: 1.0 LEFT TOTAL HIP: BMD (in g/cm2): 1.359 T-score: 2.8 Z-score: 2.4 LEFT FOREARM (RADIUS 33%): BMD (in g/cm2): 0.989 T-score: 1.3 Z-score: 2.3 FRAX 10-YEAR PROBABILITY OF FRACTURE: FRAX not reported as the lowest BMD is not in the osteopenia range. IMPRESSION: Normal based on BMD. Fracture risk is not increased. RECOMMENDATIONS: 1. All patients should optimize calcium and vitamin D  intake. 2.  Consider FDA-approved medical therapies in postmenopausal women and men aged 42 years and older, based on the following: - A hip or vertebral (clinical or morphometric) fracture - T-score less than or equal to -2.5 and secondary causes have been excluded. - Low bone mass (T-score between -1.0 and -2.5) and a 10-year probability of a hip fracture greater than or equal to 3% or a 10-year probability of a  major osteoporosis-related fracture greater than or equal to 20% based on the [place] -adapted WHO algorithm. - Clinician judgment and/or patient preferences may indicate treatment for people with 10-year fracture probabilities above or below these levels 3. Patients with diagnosis of osteoporosis or at high risk for fracture should have regular bone mineral density tests. For patients eligible for Medicare, routine testing is allowed once every 2 years. The testing frequency can be increased to one year for patients who have rapidly progressing disease, those who are receiving or discontinuing medical therapy to restore bone mass, or have additional risk factors. Electronically Signed   By: Reyes Phi M.D.   On: 07/27/2024 14:29    Assessment & Plan:   Problem List Items Addressed This Visit     Cough - Primary   post - URI cough, losing voice: pt took abx, steroids Mucinex  DM Cough syr was renewed      Hoarseness   post - URI cough, losing voice: pt took abx, steroids Mucinex  DM Cough syr was renewed         Meds ordered this encounter  Medications   dextromethorphan-guaiFENesin  (MUCINEX  DM) 30-600 MG 12hr tablet    Sig: Take 1 tablet by mouth 2 (two) times daily as needed for cough.   HYDROcodone  bit-homatropine (HYCODAN) 5-1.5 MG/5ML syrup    Sig: Take 5 mLs by mouth every 6 (six) hours as needed for up to 10 days.    Dispense:  180 mL    Refill:  0   albuterol  (PROVENTIL  HFA) 108 (90 Base) MCG/ACT inhaler    Sig: Inhale 2 puffs into the lungs every 4 (four) hours as needed for wheezing  or shortness of breath.    Dispense:  8.5 g    Refill:  11   carvedilol  (COREG ) 25 MG tablet    Sig: Take 1 tablet (25 mg total) by mouth 2 (two) times daily with a meal.    Dispense:  180 tablet    Refill:  3    ZERO refills remain on this prescription. Your patient is requesting advance approval of refills for this medication to PREVENT ANY MISSED DOSES      Follow-up: Return in about 4 months (around 12/02/2024) for a follow-up visit.  Marolyn Noel, MD

## 2024-08-02 NOTE — Assessment & Plan Note (Signed)
 post - URI cough, losing voice: pt took abx, steroids Mucinex  DM Cough syr was renewed

## 2024-08-05 ENCOUNTER — Ambulatory Visit: Payer: Self-pay | Admitting: Internal Medicine

## 2024-08-05 NOTE — Telephone Encounter (Signed)
 Bone density scan was normal.  Continue with vitamin D .  Thanks

## 2024-08-06 NOTE — Telephone Encounter (Signed)
 LDVM for pt of the following per PCP  Bone density scan was normal.  Continue with vitamin D .  Thanks

## 2024-08-10 DIAGNOSIS — I129 Hypertensive chronic kidney disease with stage 1 through stage 4 chronic kidney disease, or unspecified chronic kidney disease: Secondary | ICD-10-CM | POA: Diagnosis not present

## 2024-08-10 DIAGNOSIS — Z008 Encounter for other general examination: Secondary | ICD-10-CM | POA: Diagnosis not present

## 2024-08-13 DIAGNOSIS — I10 Essential (primary) hypertension: Secondary | ICD-10-CM | POA: Diagnosis not present

## 2024-08-20 DIAGNOSIS — H43811 Vitreous degeneration, right eye: Secondary | ICD-10-CM | POA: Diagnosis not present

## 2024-09-14 DIAGNOSIS — Z008 Encounter for other general examination: Secondary | ICD-10-CM | POA: Diagnosis not present

## 2024-09-14 DIAGNOSIS — I129 Hypertensive chronic kidney disease with stage 1 through stage 4 chronic kidney disease, or unspecified chronic kidney disease: Secondary | ICD-10-CM | POA: Diagnosis not present

## 2024-11-25 ENCOUNTER — Ambulatory Visit: Payer: No Typology Code available for payment source

## 2024-11-25 VITALS — Ht 64.0 in | Wt 272.0 lb

## 2024-11-25 DIAGNOSIS — Z122 Encounter for screening for malignant neoplasm of respiratory organs: Secondary | ICD-10-CM | POA: Diagnosis not present

## 2024-11-25 DIAGNOSIS — Z1159 Encounter for screening for other viral diseases: Secondary | ICD-10-CM

## 2024-11-25 DIAGNOSIS — Z87891 Personal history of nicotine dependence: Secondary | ICD-10-CM

## 2024-11-25 DIAGNOSIS — Z0000 Encounter for general adult medical examination without abnormal findings: Secondary | ICD-10-CM | POA: Diagnosis not present

## 2024-11-25 DIAGNOSIS — Z1231 Encounter for screening mammogram for malignant neoplasm of breast: Secondary | ICD-10-CM | POA: Diagnosis not present

## 2024-11-25 NOTE — Progress Notes (Signed)
 Chief Complaint  Patient presents with   Medicare Wellness     Subjective:  Please attest and cosign this visit due to patients primary care provider not being in the office at the time the visit was completed.  (Pt of Dr A. Plotnikov)   [name] is a 68 y.o. female who presents for a Medicare Annual Wellness Visit.  Visit info / Clinical Intake: Medicare Wellness Visit Type:: Subsequent Annual Wellness Visit Persons participating in visit and providing information:: patient Medicare Wellness Visit Mode:: Telephone If telephone:: video error Since this visit was completed virtually, some vitals may be partially provided or unavailable. Missing vitals are due to the limitations of the virtual format.: Documented vitals are patient reported If Telephone or Video please confirm:: I connected with patient using audio/video enable telemedicine. I verified patient identity with two identifiers, discussed telehealth limitations, and patient agreed to proceed. Patient Location:: Home Provider Location:: [facility] Interpreter Needed?: No Pre-visit prep was completed: yes AWV questionnaire completed by patient prior to visit?: no Living arrangements:: (!) lives alone Patient's Overall Health Status Rating: good Typical amount of pain: none Does pain affect daily life?: no Are you currently prescribed opioids?: no  Dietary Habits and Nutritional Risks How many meals a day?: 2 Eats fruit and vegetables daily?: yes Most meals are obtained by: preparing own meals; eating out In the last 2 weeks, have you had any of the following?: none Diabetic:: no  Functional Status Activities of Daily Living (to include ambulation/medication): Independent Ambulation: Independent with device- listed below Home Assistive Devices/Equipment: Eyeglasses Medication Administration: Independent Home Management (perform basic housework or laundry): Independent Manage your own finances?: yes Primary  transportation is: driving Concerns about vision?: no *vision screening is required for WTM* Concerns about hearing?: no  Fall Screening Falls in the past year?: 0 Number of falls in past year: 0 Was there an injury with Fall?: 0 Fall Risk Category Calculator: 0 Patient Fall Risk Level: Low Fall Risk  Fall Risk Patient at Risk for Falls Due to: No Fall Risks Fall risk Follow up: Falls evaluation completed; Falls prevention discussed  Home and Transportation Safety: All rugs have non-skid backing?: N/A, no rugs All stairs or steps have railings?: yes Grab bars in the bathtub or shower?: (!) no Have non-skid surface in bathtub or shower?: yes Good home lighting?: yes Regular seat belt use?: yes Hospital stays in the last year:: no  Cognitive Assessment Difficulty concentrating, remembering, or making decisions? : no Will 6CIT or Mini Cog be Completed: yes What year is it?: 0 points What month is it?: 0 points Give patient an address phrase to remember (5 components): [address] [place], [place] About what time is it?: 0 points Count backwards from 20 to 1: 0 points Say the months of the year in reverse: 0 points Repeat the address phrase from earlier: 0 points 6 CIT Score: 0 points  Advance Directives (For Healthcare) Does Patient Have a Medical Advance Directive?: No Would patient like information on creating a medical advance directive?: Yes (MAU/Ambulatory/Procedural Areas - Information given)  Reviewed/Updated  Reviewed/Updated: Reviewed All (Medical, Surgical, Family, Medications, Allergies, Care Teams, Patient Goals)    Allergies (verified) Calan [verapamil], Citric acid, Diovan [valsartan], Hydrochlorothiazide, and Phentermine  hcl   Current Medications (verified) Outpatient Encounter Medications as of 11/25/2024  Medication Sig   albuterol  (PROVENTIL  HFA) 108 (90 Base) MCG/ACT inhaler Inhale 2 puffs into the lungs every 4 (four) hours as needed for wheezing or  shortness of  breath.   Ascorbic Acid (VITAMIN C PO) Take 1 tablet by mouth daily.   carvedilol  (COREG ) 25 MG tablet Take 1 tablet (25 mg total) by mouth 2 (two) times daily with a meal.   Cholecalciferol (VITAMIN D3 PO) Take 1 tablet by mouth daily.   Cyanocobalamin  (VITAMIN B12 PO) Take 1 tablet by mouth daily.   dextromethorphan-guaiFENesin  (MUCINEX  DM) 30-600 MG 12hr tablet Take 1 tablet by mouth 2 (two) times daily as needed for cough.   furosemide  (LASIX ) 20 MG tablet Take 1 tablet (20 mg total) by mouth daily.   GINSENG PO Take 1 tablet by mouth daily.   losartan (COZAAR) 50 MG tablet Take 50 mg by mouth daily. Rx started by [facility] Provider   montelukast  (SINGULAIR ) 10 MG tablet TAKE 1 TABLET(10 MG) BY MOUTH DAILY   triamcinolone  cream (KENALOG ) 0.5 % Apply 1 Application topically 3 (three) times daily. Must keep 11/28/21 appt for future refills   VITAMIN E PO Take 1 capsule by mouth daily.   No facility-administered encounter medications on file as of 11/25/2024.    History: Past Medical History:  Diagnosis Date   Abnormal glucose 2009   Allergic rhinitis    Asthma    Back pain    Dyspnea    Food allergy    GERD (gastroesophageal reflux disease)    Headache    Migraines (rare). Has not had any in years as of 2025   History of kidney stones    HTN (hypertension)    Lactose intolerance    Leg edema    Osteoarthritis    Right Hip - SEVERE   Pneumonia    Past Surgical History:  Procedure Laterality Date   BREAST BIOPSY Left 12/17/2023   MM LT BREAST BX W LOC DEV 1ST LESION IMAGE BX SPEC STEREO GUIDE 12/17/2023 [facility]   BREAST BIOPSY  01/14/2024   MM LT RADIOACTIVE SEED LOC MAMMO GUIDE 01/14/2024 [facility]   BREAST LUMPECTOMY WITH RADIOACTIVE SEED LOCALIZATION Left 01/15/2024   Procedure: LEFT BREAST RADIOACTIVE SEED LOCALIZED LUMPECTOMY;  Surgeon: Curvin Deward MOULD, MD;  Location: [facility] OR;  Service: General;  Laterality: Left;   CARDIAC CATHETERIZATION   2004   [facility] ASD Repair at [facility]   CHOLECYSTECTOMY     COLONOSCOPY  2014   HYSTEROSCOPY WITH D & C N/A 04/04/2016   Procedure: DILATATION AND CURETTAGE /HYSTEROSCOPY, POLYPECTOMY WITH MYOSURE;  Surgeon: Oneil FORBES Piety, MD;  Location: [facility];  Service: Gynecology;  Laterality: N/A;   KNEE ARTHROSCOPY     left    TOTAL HIP ARTHROPLASTY Right 07/20/2013   Procedure: RIGHT TOTAL HIP ARTHROPLASTY ANTERIOR APPROACH;  Surgeon: Donnice JONETTA Car, MD;  Location: [facility];  Service: Orthopedics;  Laterality: Right;   TUBAL LIGATION     WISDOM TOOTH EXTRACTION     Family History  Problem Relation Age of Onset   Parkinsonism Mother    Hypertension Mother    Hyperlipidemia Mother    Hypertension Unknown    Social History   Occupational History   Occupation: Audiological Scientist  Tobacco Use   Smoking status: Former    Current packs/day: 0.00    Average packs/day: 0.5 packs/day for 40.0 years (20.0 ttl pk-yrs)    Types: Cigarettes    Start date: 12/09/1971    Quit date: 12/09/2011    Years since quitting: 12.9   Smokeless tobacco: Never   Tobacco comments:    2009  Vaping Use   Vaping status:  Never Used  Substance and Sexual Activity   Alcohol use: Yes    Alcohol/week: 7.0 standard drinks of alcohol    Types: 7 Glasses of wine per week    Comment: 1 glass of wine nightly   Drug use: No   Sexual activity: Not Currently   Tobacco Counseling Counseling given: Yes Tobacco comments: 2009  SDOH Screenings   Food Insecurity: No Food Insecurity (11/25/2024)  Housing: Unknown (11/25/2024)  Transportation Needs: No Transportation Needs (11/25/2024)  Utilities: Not At Risk (11/25/2024)  Alcohol Screen: Low Risk (07/30/2024)  Depression (PHQ2-9): Low Risk (11/25/2024)  Financial Resource Strain: Medium Risk (07/30/2024)  Physical Activity: Insufficiently Active (11/25/2024)  Social Connections: Socially Isolated (11/25/2024)  Stress: No Stress Concern Present  (11/25/2024)  Tobacco Use: Medium Risk (11/25/2024)  Health Literacy: Adequate Health Literacy (11/25/2024)   See flowsheets for full screening details  Depression Screen PHQ 2 & 9 Depression Scale- Over the past 2 weeks, how often have you been bothered by any of the following problems? Little interest or pleasure in doing things: 0 Feeling down, depressed, or hopeless (PHQ Adolescent also includes...irritable): 0 PHQ-2 Total Score: 0 Trouble falling or staying asleep, or sleeping too much: 0 Feeling tired or having little energy: 0 Poor appetite or overeating (PHQ Adolescent also includes...weight loss): 0 Feeling bad about yourself - or that you are a failure or have let yourself or your family down: 0 Trouble concentrating on things, such as reading the newspaper or watching television (PHQ Adolescent also includes...like school work): 0 Moving or speaking so slowly that other people could have noticed. Or the opposite - being so fidgety or restless that you have been moving around a lot more than usual: 0 Thoughts that you would be better off dead, or of hurting yourself in some way: 0 PHQ-9 Total Score: 0 If you checked off any problems, how difficult have these problems made it for you to do your work, take care of things at home, or get along with other people?: Not difficult at all  Depression Treatment Depression Interventions/Treatment : EYV7-0 Score <4 Follow-up Not Indicated     Goals Addressed               This Visit's Progress     Patient Stated (pt-stated)        Patient stated she plans to continue be more active and exercising             Objective:    Today's Vitals   11/25/24 1519  Weight: 272 lb (123.4 kg)  Height: 5' 4 (1.626 m)   Body mass index is 46.69 kg/m.  Hearing/Vision screen Hearing Screening - Comments:: Denies hearing difficulties   Vision Screening - Comments:: Wears rx glasses - up to date with routine eye exams with Heather  Oman  Immunizations and Health Maintenance Health Maintenance  Topic Date Due   Hepatitis C Screening  Never done   Lung Cancer Screening  Never done   Pneumococcal Vaccine: 50+ Years (2 of 2 - PPSV23, PCV20, or PCV21) 02/22/2019   Colonoscopy  11/01/2019   COVID-19 Vaccine (3 - Pfizer risk series) 04/05/2020   Mammogram  11/25/2024   Medicare Annual Wellness (AWV)  11/25/2025   DTaP/Tdap/Td (3 - Td or Tdap) 03/04/2027   Influenza Vaccine  Completed   Bone Density Scan  Completed   Meningococcal B Vaccine  Aged Out   Zoster Vaccines- Shingrix  Discontinued        Assessment/Plan:  This  is a routine wellness examination for Anora.  Mammogram status: ordered today Hepatitis C Screening: ordered today Cologuard test: pt declined Colonoscopy - states has performed Colguard test & will obtain results for PCP for review Lung Cancer Screening status: ordered today  Patient Care Team: Plotnikov, Karlynn GAILS, MD as PCP - General (Internal Medicine) Oman, Heather, OD (Optometry)  I have personally reviewed and noted the following in the patients chart:   Medical and social history Use of alcohol, tobacco or illicit drugs  Current medications and supplements including opioid prescriptions. Functional ability and status Nutritional status Physical activity Advanced directives List of other physicians Hospitalizations, surgeries, and ER visits in previous 12 months Vitals Screenings to include cognitive, depression, and falls Referrals and appointments  Orders Placed This Encounter  Procedures   MM 3D SCREENING MAMMOGRAM BILATERAL BREAST    Standing Status:   Future    Expiration Date:   11/25/2025    Reason for Exam (SYMPTOM  OR DIAGNOSIS REQUIRED):   screening for breast cancer    Preferred imaging location?:   [facility]   Hepatitis C antibody    Standing Status:   Future    Expiration Date:   11/25/2025   Ambulatory Referral for Lung Cancer Scre    Referral  Priority:   Routine    Referral Type:   Consultation    Referral Reason:   Specialty Services Required    Referred to Provider:   Ruthell Lauraine FALCON, NP    Number of Visits Requested:   1   In addition, I have reviewed and discussed with patient certain preventive protocols, quality metrics, and best practice recommendations. A written personalized care plan for preventive services as well as general preventive health recommendations were provided to patient.   Verdie CHRISTELLA Saba, CMA   11/25/2024   Return in 1 year (on 11/25/2025).  After Visit Summary: (MyChart) Due to this being a telephonic visit, the after visit summary with patients personalized plan was offered to patient via MyChart   Nurse Notes: Appt w/PCP on 12/06/2025; scheduled 2026 AWV/CPE appts

## 2024-11-25 NOTE — Patient Instructions (Addendum)
 Ms. Blackston,  Thank you for taking the time for your Medicare Wellness Visit. I appreciate your continued commitment to your health goals. Please review the care plan we discussed, and feel free to reach out if I can assist you further.  Please note that Annual Wellness Visits do not include a physical exam. Some assessments may be limited, especially if the visit was conducted virtually. If needed, we may recommend an in-person follow-up with your provider.  Ongoing Care Seeing your primary care provider every 3 to 6 months helps us  monitor your health and provide consistent, personalized care.   Referrals If a referral was made during today's visit and you haven't received any updates within two weeks, please contact the referred provider directly to check on the status.  Recommended Screenings:  Health Maintenance  Topic Date Due   Hepatitis C Screening  Never done   Screening for Lung Cancer  Never done   Pneumococcal Vaccine for age over 80 (2 of 2 - PPSV23, PCV20, or PCV21) 02/22/2019   Colon Cancer Screening  11/01/2019   COVID-19 Vaccine (3 - Pfizer risk series) 04/05/2020   Medicare Annual Wellness Visit  11/24/2024   Breast Cancer Screening  11/25/2024   DTaP/Tdap/Td vaccine (3 - Td or Tdap) 03/04/2027   Flu Shot  Completed   Osteoporosis screening with Bone Density Scan  Completed   Meningitis B Vaccine  Aged Out   Zoster (Shingles) Vaccine  Discontinued       01/13/2024    8:22 AM  Advanced Directives  Does Patient Have a Medical Advance Directive? No  Would patient like information on creating a medical advance directive? No - Patient declined    Vision: Annual vision screenings are recommended for early detection of glaucoma, cataracts, and diabetic retinopathy. These exams can also reveal signs of chronic conditions such as diabetes and high blood pressure.  Dental: Annual dental screenings help detect early signs of oral cancer, gum disease, and other conditions  linked to overall health, including heart disease and diabetes.

## 2024-11-26 NOTE — Addendum Note (Signed)
 Addended by: GERALDENE VERDIE HERO on: 11/26/2024 02:50 PM   Modules accepted: Orders

## 2024-12-06 ENCOUNTER — Telehealth: Payer: Self-pay

## 2024-12-06 ENCOUNTER — Ambulatory Visit: Admitting: Internal Medicine

## 2024-12-06 NOTE — Telephone Encounter (Signed)
 Copied from CRM #[id number]. Topic: Clinical - Medical Advice >> Dec 06, 2024  2:28 PM [name] wrote: Reason for CRM: Patient missed apt for today, but is needing a script for Z pack. Please review and contact pt to advise c/b: [phone]

## 2024-12-07 ENCOUNTER — Encounter: Payer: Self-pay | Admitting: Internal Medicine

## 2024-12-08 MED ORDER — AZITHROMYCIN 250 MG PO TABS: ORAL_TABLET | ORAL | 0 refills | Status: AC

## 2024-12-08 NOTE — Addendum Note (Signed)
 Addended by: Marcell Chavarin V on: 12/08/2024 08:02 AM   Modules accepted: Orders

## 2024-12-08 NOTE — Telephone Encounter (Signed)
 Okay Z-Pak.  Sent.  Thanks

## 2024-12-22 ENCOUNTER — Ambulatory Visit
Admission: RE | Admit: 2024-12-22 | Discharge: 2024-12-22 | Disposition: A | Discharge status: Home / Self Care | Source: Ambulatory Visit | Attending: Internal Medicine | Admitting: Internal Medicine

## 2024-12-22 DIAGNOSIS — Z1231 Encounter for screening mammogram for malignant neoplasm of breast: Secondary | ICD-10-CM

## 2025-01-03 ENCOUNTER — Ambulatory Visit: Admitting: Internal Medicine

## 2025-01-05 ENCOUNTER — Ambulatory Visit: Admitting: Internal Medicine

## 2025-01-06 ENCOUNTER — Encounter: Payer: Self-pay | Admitting: Internal Medicine

## 2025-01-06 ENCOUNTER — Ambulatory Visit: Payer: Self-pay | Admitting: Internal Medicine

## 2025-01-06 ENCOUNTER — Ambulatory Visit

## 2025-01-06 ENCOUNTER — Ambulatory Visit (INDEPENDENT_AMBULATORY_CARE_PROVIDER_SITE_OTHER)

## 2025-01-06 ENCOUNTER — Ambulatory Visit: Admitting: Internal Medicine

## 2025-01-06 VITALS — BP 140/82 | HR 75 | Temp 98.3°F | Ht 64.0 in | Wt 269.0 lb

## 2025-01-06 DIAGNOSIS — J4520 Mild intermittent asthma, uncomplicated: Secondary | ICD-10-CM | POA: Diagnosis not present

## 2025-01-06 DIAGNOSIS — J320 Chronic maxillary sinusitis: Secondary | ICD-10-CM

## 2025-01-06 DIAGNOSIS — R052 Subacute cough: Secondary | ICD-10-CM

## 2025-01-06 DIAGNOSIS — N2889 Other specified disorders of kidney and ureter: Secondary | ICD-10-CM

## 2025-01-06 DIAGNOSIS — E66813 Obesity, class 3: Secondary | ICD-10-CM

## 2025-01-06 DIAGNOSIS — Z6841 Body Mass Index (BMI) 40.0 and over, adult: Secondary | ICD-10-CM

## 2025-01-06 DIAGNOSIS — I10 Essential (primary) hypertension: Secondary | ICD-10-CM

## 2025-01-06 MED ORDER — CARVEDILOL 25 MG PO TABS: 25.0000 mg | ORAL_TABLET | Freq: Two times a day (BID) | ORAL | 3 refills | Status: AC

## 2025-01-06 MED ORDER — CEFDINIR 300 MG PO CAPS: 300.0000 mg | ORAL_CAPSULE | Freq: Two times a day (BID) | ORAL | 0 refills | Status: AC

## 2025-01-06 MED ORDER — BUDESONIDE-FORMOTEROL FUMARATE 160-4.5 MCG/ACT IN AERO: 2.0000 | INHALATION_SPRAY | Freq: Two times a day (BID) | RESPIRATORY_TRACT | 5 refills | Status: AC

## 2025-01-06 MED ORDER — MONTELUKAST SODIUM 10 MG PO TABS: ORAL_TABLET | ORAL | 3 refills | Status: AC

## 2025-01-06 MED ORDER — LOSARTAN POTASSIUM 50 MG PO TABS: 50.0000 mg | ORAL_TABLET | Freq: Every day | ORAL | 3 refills | Status: AC

## 2025-01-06 MED ORDER — METHYLPREDNISOLONE 4 MG PO TBPK: ORAL_TABLET | ORAL | 0 refills | Status: AC

## 2025-01-06 MED ORDER — HYDROCODONE BIT-HOMATROP MBR 5-1.5 MG/5ML PO SOLN: 5.0000 mL | ORAL | 0 refills | Status: AC | PRN

## 2025-01-06 MED ORDER — FUROSEMIDE 20 MG PO TABS: 20.0000 mg | ORAL_TABLET | Freq: Every day | ORAL | 3 refills | Status: AC

## 2025-01-06 NOTE — Assessment & Plan Note (Signed)
Hydrate well 2023 GFR>60

## 2025-01-06 NOTE — Progress Notes (Signed)
 "  Subjective:  Patient ID: [name], female    DOB: 1956-08-03  Age: 69 y.o. MRN: [id number]  CC: Follow-up (On going coiugh)   HPI [name] presents for cough x 1 month, post-URI Pt took a Zpack 3 wks ago F/u on asthma, HTN  Outpatient Medications Prior to Visit  Medication Sig Dispense Refill   albuterol  (PROVENTIL  HFA) 108 (90 Base) MCG/ACT inhaler Inhale 2 puffs into the lungs every 4 (four) hours as needed for wheezing or shortness of breath. 8.5 g 11   Ascorbic Acid (VITAMIN C PO) Take 1 tablet by mouth daily.     Cholecalciferol (VITAMIN D3 PO) Take 1 tablet by mouth daily.     Cyanocobalamin  (VITAMIN B12 PO) Take 1 tablet by mouth daily.     GINSENG PO Take 1 tablet by mouth daily.     triamcinolone  cream (KENALOG ) 0.5 % Apply 1 Application topically 3 (three) times daily. Must keep 11/28/21 appt for future refills 120 g 0   VITAMIN E PO Take 1 capsule by mouth daily.     carvedilol  (COREG ) 25 MG tablet Take 1 tablet (25 mg total) by mouth 2 (two) times daily with a meal. 180 tablet 3   furosemide  (LASIX ) 20 MG tablet Take 1 tablet (20 mg total) by mouth daily. 90 tablet 3   losartan  (COZAAR ) 50 MG tablet Take 50 mg by mouth daily. Rx started by [facility] Provider     montelukast  (SINGULAIR ) 10 MG tablet TAKE 1 TABLET(10 MG) BY MOUTH DAILY 90 tablet 3   azithromycin  (ZITHROMAX  Z-PAK) 250 MG tablet As directed 6 tablet 0   dextromethorphan-guaiFENesin  (MUCINEX  DM) 30-600 MG 12hr tablet Take 1 tablet by mouth 2 (two) times daily as needed for cough.     No facility-administered medications prior to visit.    ROS: Review of Systems  Constitutional:  Negative for activity change, appetite change, chills, fatigue and unexpected weight change.  HENT:  Positive for congestion, postnasal drip, sinus pressure, sinus pain and voice change. Negative for mouth sores.   Eyes:  Negative for visual disturbance.  Respiratory:  Positive for cough. Negative for chest tightness.    Gastrointestinal:  Negative for abdominal pain and nausea.  Genitourinary:  Negative for difficulty urinating, frequency and vaginal pain.  Musculoskeletal:  Negative for back pain and gait problem.  Skin:  Negative for pallor and rash.  Neurological:  Negative for dizziness, tremors, weakness, numbness and headaches.  Psychiatric/Behavioral:  Negative for confusion and sleep disturbance.     Objective:  BP (!) 140/82   Pulse 75   Temp 98.3 F (36.8 C) (Oral)   Ht 5' 4 (1.626 m)   Wt 269 lb (122 kg)   SpO2 94%   BMI 46.17 kg/m   BP Readings from Last 3 Encounters:  01/06/25 (!) 140/82  08/02/24 (!) 148/74  01/15/24 (!) 144/77    Wt Readings from Last 3 Encounters:  01/06/25 269 lb (122 kg)  11/25/24 272 lb (123.4 kg)  08/02/24 272 lb (123.4 kg)    Physical Exam Constitutional:      General: She is not in acute distress.    Appearance: She is well-developed. She is obese.  HENT:     Head: Normocephalic.     Right Ear: External ear normal.     Left Ear: External ear normal.     Nose: Congestion present.     Mouth/Throat:     Pharynx: Posterior oropharyngeal erythema present. No oropharyngeal  exudate.  Eyes:     General:        Right eye: No discharge.        Left eye: No discharge.     Conjunctiva/sclera: Conjunctivae normal.     Pupils: Pupils are equal, round, and reactive to light.  Neck:     Thyroid : No thyromegaly.     Vascular: No JVD.     Trachea: No tracheal deviation.  Cardiovascular:     Rate and Rhythm: Normal rate and regular rhythm.     Heart sounds: Normal heart sounds.  Pulmonary:     Effort: No respiratory distress.     Breath sounds: No stridor. No wheezing.  Abdominal:     General: Bowel sounds are normal. There is no distension.     Palpations: Abdomen is soft. There is no mass.     Tenderness: There is no abdominal tenderness. There is no guarding or rebound.  Musculoskeletal:        General: No tenderness.     Cervical back:  Normal range of motion and neck supple. No rigidity.  Lymphadenopathy:     Cervical: No cervical adenopathy.  Skin:    Findings: No erythema or rash.  Neurological:     Mental Status: She is oriented to person, place, and time.     Cranial Nerves: No cranial nerve deficit.     Motor: No abnormal muscle tone.     Coordination: Coordination normal.     Deep Tendon Reflexes: Reflexes normal.  Psychiatric:        Behavior: Behavior normal.        Thought Content: Thought content normal.        Judgment: Judgment normal.   Swollen eyelids, nasal voice  Lab Results  Component Value Date   WBC 6.8 03/01/2024   HGB 13.4 03/01/2024   HCT 40.5 03/01/2024   PLT 238.0 03/01/2024   GLUCOSE 116 (H) 03/01/2024   CHOL 177 03/01/2024   TRIG 104.0 03/01/2024   HDL 41.60 03/01/2024   LDLCALC 115 (H) 03/01/2024   ALT 16 03/01/2024   AST 14 03/01/2024   NA 142 03/01/2024   K 4.3 03/01/2024   CL 102 03/01/2024   CREATININE 1.05 03/01/2024   BUN 14 03/01/2024   CO2 32 03/01/2024   TSH 0.71 03/01/2024   INR 1.09 07/13/2013   HGBA1C 6.3 03/01/2024    MM 3D SCREENING MAMMOGRAM BILATERAL BREAST Result Date: 12/24/2024 CLINICAL DATA:  Screening. 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 No residual papilloma or ADH identified on the lumpectomy specimen. EXAM: DIGITAL SCREENING BILATERAL MAMMOGRAM WITH TOMOSYNTHESIS AND CAD TECHNIQUE: Bilateral screening digital craniocaudal and mediolateral oblique mammograms were obtained. Bilateral screening digital breast tomosynthesis was performed. The images were evaluated with computer-aided detection. COMPARISON:  Previous exam(s). ACR Breast Density Category b: There are scattered areas of fibroglandular density. FINDINGS: There are no findings suspicious for malignancy. There is density and architectural distortion within the LEFT breast, consistent with postsurgical changes. These are new in comparison to prior. IMPRESSION: No mammographic  evidence of malignancy. A result letter of this screening mammogram will be mailed directly to the patient. RECOMMENDATION: Screening mammogram in one year. (Code:SM-B-01Y) BI-RADS CATEGORY  2: Benign./ Electronically Signed   By: Corean Salter M.D.   On: 12/24/2024 10:10    Assessment & Plan:   Problem List Items Addressed This Visit     Obesity   Wt loss was discussed - Wegovy  tablets, Zepbound vials  Essential hypertension   Continue Coreg , Furosemide  Wt loss was discussed      Relevant Medications   carvedilol  (COREG ) 25 MG tablet   furosemide  (LASIX ) 20 MG tablet   losartan  (COZAAR ) 50 MG tablet   Asthma   Use Ventolin  MDI prn, re-start Singulair  Start Symbicort  2 puffs qid Hycodan cough syr CXR      Relevant Medications   budesonide -formoterol  (SYMBICORT ) 160-4.5 MCG/ACT inhaler   methylPREDNISolone  (MEDROL  DOSEPAK) 4 MG TBPK tablet   montelukast  (SINGULAIR ) 10 MG tablet   Chronic renal insufficiency, stage 3 (moderate)   Hydrate well 2023 GFR>60      Sinusitis   Start Omnicef  qd      Relevant Medications   methylPREDNISolone  (MEDROL  DOSEPAK) 4 MG TBPK tablet   HYDROcodone  bit-homatropine (HYCODAN) 5-1.5 MG/5ML syrup   cefdinir  (OMNICEF ) 300 MG capsule   Cough - Primary   Post-URI CXR      Relevant Orders   DG Chest 2 View      Meds ordered this encounter  Medications   budesonide -formoterol  (SYMBICORT ) 160-4.5 MCG/ACT inhaler    Sig: Inhale 2 puffs into the lungs 2 (two) times daily.    Dispense:  1 each    Refill:  5   methylPREDNISolone  (MEDROL  DOSEPAK) 4 MG TBPK tablet    Sig: As directed    Dispense:  21 tablet    Refill:  0   HYDROcodone  bit-homatropine (HYCODAN) 5-1.5 MG/5ML syrup    Sig: Take 5 mLs by mouth every 4 (four) hours as needed for cough.    Dispense:  240 mL    Refill:  0   cefdinir  (OMNICEF ) 300 MG capsule    Sig: Take 1 capsule (300 mg total) by mouth 2 (two) times daily.    Dispense:  20 capsule    Refill:  0    montelukast  (SINGULAIR ) 10 MG tablet    Sig: TAKE 1 TABLET(10 MG) BY MOUTH DAILY    Dispense:  90 tablet    Refill:  3    ZERO refills remain on this prescription. Your patient is requesting advance approval of refills for this medication to PREVENT ANY MISSED DOSES   carvedilol  (COREG ) 25 MG tablet    Sig: Take 1 tablet (25 mg total) by mouth 2 (two) times daily with a meal.    Dispense:  180 tablet    Refill:  3    ZERO refills remain on this prescription. Your patient is requesting advance approval of refills for this medication to PREVENT ANY MISSED DOSES   furosemide  (LASIX ) 20 MG tablet    Sig: Take 1 tablet (20 mg total) by mouth daily.    Dispense:  90 tablet    Refill:  3   losartan  (COZAAR ) 50 MG tablet    Sig: Take 1 tablet (50 mg total) by mouth daily. Rx started by [facility] Provider    Dispense:  90 tablet    Refill:  3      Follow-up: Return in about 3 months (around 04/06/2025) for a follow-up visit.  Marolyn Noel, MD "

## 2025-01-06 NOTE — Assessment & Plan Note (Signed)
 Wt loss was discussed - Wegovy  tablets, Zepbound vials

## 2025-01-06 NOTE — Assessment & Plan Note (Signed)
 Use Ventolin  MDI prn, re-start Singulair  Start Symbicort  2 puffs qid Hycodan cough syr CXR

## 2025-01-06 NOTE — Assessment & Plan Note (Addendum)
 Continue Coreg , Furosemide  Wt loss was discussed

## 2025-01-06 NOTE — Assessment & Plan Note (Signed)
 Post-URI CXR

## 2025-01-06 NOTE — Assessment & Plan Note (Addendum)
 Start Omnicef  qd

## 2025-01-06 NOTE — Patient Instructions (Signed)
 [employer] has recently reduced the self-pay cash prices for Zepbound  single-dose vials purchased through the [employer] online pharmacy   The new prices for a four-week supply (four single-dose vials) for self-paying patients are:   2.5 mg dose: $299 per month (previously $349) 5 mg dose: $399 per month (previously $499) 7.5 mg, 10 mg, 12.5 mg, and 15 mg doses: $449 per month (previously $499) -----------------------------------------------------------------------------------------------------------------------------------------  [employer] pills Cash Pricing & Options (2026):  $149 per month: For the 1.5 mg and 4 mg doses (available through March 23, 2025). $199 per month: For the 4 mg dose starting March 24, 2025. $299 per month: For 9 mg and 25 mg doses. Provider Programs: Companies like [employer] and [employer] offer these cash prices directly to consumers, sometimes with additional telehealth fees. Savings Restrictions: These self-pay offers are generally not available to patients with government insurance like Medicare or Medicaid.  The Wegovy pill (taken daily) has a lower out-of-pocket, self-pay price compared to the [facility] injection (taken weekly), which costs $349+ per month for self-pay consumers.    Heres the typical dosing schedule for the [facility] pill:   Starting dosage: 1.5 mg once daily for the first four weeks. This starter dose helps your body get used to the medication and sets the stage for higher doses later on.  Step-up dosages: Every four weeks, the dose increased -- first to 4 mg, then 9 mg, and finally 25 mg. In clinical trials, most people reached the full dose after about three months of gradual increases.  Maintenance dosage: 25 mg once daily. This was the main dose used for ongoing weight loss in trials and is the maintenance dose approved by the [place]  Food and Drug Administration (FDA). If someone couldnt tolerate 25 mg in the trials, they stayed on a lower dose and tried  increasing again later with medical guidance.  Maximum dosage: 25 mg once daily. This is the highest dose approved by the FDA. After about 64 weeks, people taking 25 mg daily lost an average of 13.6% of their body weight.  Some studies explored even hi

## 2025-01-12 ENCOUNTER — Ambulatory Visit (INDEPENDENT_AMBULATORY_CARE_PROVIDER_SITE_OTHER): Admitting: Otolaryngology

## 2025-01-12 ENCOUNTER — Encounter (INDEPENDENT_AMBULATORY_CARE_PROVIDER_SITE_OTHER): Payer: Self-pay | Admitting: Otolaryngology

## 2025-01-12 VITALS — BP 137/79 | HR 94

## 2025-01-12 DIAGNOSIS — J310 Chronic rhinitis: Secondary | ICD-10-CM

## 2025-01-12 DIAGNOSIS — H6983 Other specified disorders of Eustachian tube, bilateral: Secondary | ICD-10-CM

## 2025-01-12 DIAGNOSIS — H6123 Impacted cerumen, bilateral: Secondary | ICD-10-CM

## 2025-01-12 DIAGNOSIS — R430 Anosmia: Secondary | ICD-10-CM

## 2025-01-12 NOTE — Progress Notes (Unsigned)
 Follow up: Eustachian tube dysfunction, anosmia, nasal congestion  Discussed the use of AI scribe software for clinical note transcription with the patient, who gave verbal consent to proceed.  History of Present Illness [name] is a 69 year old female with history of eustachian tube dysfunction who presents for otolaryngology follow-up after a prolonged respiratory illness with associated otologic symptoms.  Shortly after Christmas, she developed a flu-like illness lasting over a month, characterized by nasal congestion, anosmia, persistent cough, and marked fatigue. She required antibiotics and steroids for respiratory symptoms and was unable to walk short distances without dyspnea. Chest radiography was reportedly normal. She was started on additional medication for cough last Thursday, resulting in improvement, though a mild cough persists.  During the illness, she experienced intermittent aural fullness and decreased hearing, particularly in the left ear. She noted diminished hearing requiring increased television volume to 52. The left ear was described as feeling damp, and the right ear had increased cerumen accumulation. She also experienced anosmia, which has since improved.  She received her annual influenza vaccination prior to the illness and currently reports overall improvement in both respiratory and otologic symptoms.  Exam: General: Communicates without difficulty, well nourished, no acute distress. Head: Normocephalic, no evidence injury, no tenderness, facial buttresses intact without stepoff. Face/sinus: No tenderness to palpation and percussion. Facial movement is normal and symmetric. Eyes: PERRL, EOMI. No scleral icterus, conjunctivae clear. Neuro: CN II exam reveals vision grossly intact.  No nystagmus at any point of gaze. Ears: Auricles well formed without lesions.  Bilateral cerumen impaction.  Nose: External evaluation reveals normal support and skin without lesions.   Dorsum is intact.  Anterior rhinoscopy reveals congested mucosa over anterior aspect of inferior turbinates and intact septum.  No purulence noted. Oral:  Oral cavity and oropharynx are intact, symmetric, without erythema or edema.  Mucosa is moist without lesions. Neck: Full range of motion without pain.  There is no significant lymphadenopathy.  No masses palpable.  Thyroid  bed within normal limits to palpation.  Parotid glands and submandibular glands equal bilaterally without mass.  Trachea is midline. Neuro:  CN 2-12 grossly intact.   Procedure: Bilateral cerumen disimpaction Anesthesia: None Description: Under the operating microscope, the cerumen is carefully removed with a combination of cerumen currette, alligator forceps, and suction catheters.  After the cerumen is removed, the TMs are noted to be normal.  No mass, erythema, or lesions. The patient tolerated the procedure well.    Assessment & Plan Bilateral cerumen impaction - Otomicroscopy with bilateral cerumen disimpaction. - Advised against insertion of objects into the external auditory canal and recommended cleaning only the auricle with a washcloth. - Recommended annual otolaryngology follow-up unless new otologic symptoms arise.  Chronic eustachian tube dysfunction - The patient is encouraged to use Flonase nasal spray 2 sprays each nostril daily to treat the eustachian tube dysfunction.  Upper respiratory infection and anosmia - Her upper respiratory symptoms have improved with medical treatments. - Mild nasal mucosal congestion is noted today.  No active infection or purulent drainage is noted.

## 2025-01-13 DIAGNOSIS — H6983 Other specified disorders of Eustachian tube, bilateral: Secondary | ICD-10-CM | POA: Insufficient documentation

## 2025-01-13 DIAGNOSIS — J310 Chronic rhinitis: Secondary | ICD-10-CM | POA: Insufficient documentation

## 2025-01-13 DIAGNOSIS — R430 Anosmia: Secondary | ICD-10-CM | POA: Insufficient documentation
